# Patient Record
Sex: Female | Born: 1945 | Race: White | Hispanic: No | Smoking: Never smoker
Health system: Southern US, Community
[De-identification: ages and names within clinical notes are randomized; demographics above are authoritative.]

## PROBLEM LIST (undated history)

## (undated) DIAGNOSIS — M1990 Unspecified osteoarthritis, unspecified site: Secondary | ICD-10-CM

## (undated) DIAGNOSIS — E785 Hyperlipidemia, unspecified: Secondary | ICD-10-CM

## (undated) DIAGNOSIS — I10 Essential (primary) hypertension: Secondary | ICD-10-CM

## (undated) HISTORY — DX: Unspecified osteoarthritis, unspecified site: M19.90

## (undated) HISTORY — PX: DILATION AND CURETTAGE OF UTERUS: SHX78

## (undated) HISTORY — DX: Hyperlipidemia, unspecified: E78.5

## (undated) HISTORY — PX: TUBAL LIGATION: SHX77

## (undated) HISTORY — DX: Essential (primary) hypertension: I10

---

## 1998-08-13 DIAGNOSIS — E039 Hypothyroidism, unspecified: Secondary | ICD-10-CM | POA: Insufficient documentation

## 1998-08-13 DIAGNOSIS — E785 Hyperlipidemia, unspecified: Secondary | ICD-10-CM | POA: Insufficient documentation

## 1998-08-13 DIAGNOSIS — J301 Allergic rhinitis due to pollen: Secondary | ICD-10-CM | POA: Insufficient documentation

## 2008-01-26 ENCOUNTER — Other Ambulatory Visit: Payer: Self-pay

## 2008-01-26 ENCOUNTER — Ambulatory Visit: Payer: Self-pay | Admitting: Obstetrics & Gynecology

## 2008-01-29 DIAGNOSIS — R011 Cardiac murmur, unspecified: Secondary | ICD-10-CM | POA: Insufficient documentation

## 2008-02-05 ENCOUNTER — Ambulatory Visit: Payer: Self-pay | Admitting: Family Medicine

## 2008-02-25 DIAGNOSIS — I10 Essential (primary) hypertension: Secondary | ICD-10-CM | POA: Insufficient documentation

## 2008-04-13 ENCOUNTER — Ambulatory Visit: Payer: Self-pay | Admitting: Obstetrics & Gynecology

## 2008-09-29 DIAGNOSIS — M1990 Unspecified osteoarthritis, unspecified site: Secondary | ICD-10-CM | POA: Insufficient documentation

## 2010-10-05 ENCOUNTER — Emergency Department: Payer: Self-pay | Admitting: Emergency Medicine

## 2012-01-11 ENCOUNTER — Ambulatory Visit: Payer: Self-pay | Admitting: Family Medicine

## 2015-01-21 ENCOUNTER — Other Ambulatory Visit: Payer: Self-pay | Admitting: Family Medicine

## 2015-01-21 ENCOUNTER — Telehealth: Payer: Self-pay | Admitting: Family Medicine

## 2015-01-21 NOTE — Telephone Encounter (Signed)
I reached out to patient about her concern regarding her medication.  She stated she normally gets 6 mths supply of her medications when she see Dr. Sherrie Mustache.  She saw him on 11/03/14 and some of her medications were refilled and others were not.  Upon investigation, some were recorded and others were e-scribed to [employer].  Pt states there are not refills on her Lovastatin 20 MG; when I looked, Dr. Sherrie Mustache did send in a 90 day supply and 11 refills; however the pharmacy didn't have that on record per pt.  The other medication was the Hydrochlorothiazide which only had a 90 day supply.  Pt is requesting a refill on that as well.

## 2015-01-24 ENCOUNTER — Other Ambulatory Visit: Payer: Self-pay | Admitting: Family Medicine

## 2015-01-24 MED ORDER — LOVASTATIN 20 MG PO TABS: 20.0000 mg | ORAL_TABLET | Freq: Every day | ORAL | Status: DC

## 2015-01-24 MED ORDER — BISOPROLOL-HYDROCHLOROTHIAZIDE 5-6.25 MG PO TABS: 1.0000 | ORAL_TABLET | Freq: Every day | ORAL | Status: DC

## 2015-01-24 NOTE — Telephone Encounter (Signed)
Please advise refills? Last refill bis/hydro=11/04/2014 and Lovas=11/03/2014.

## 2015-01-24 NOTE — Telephone Encounter (Signed)
Have sent 90 days with 3 refills of both medications to [facility]. Thanks.

## 2015-06-07 DIAGNOSIS — M170 Bilateral primary osteoarthritis of knee: Secondary | ICD-10-CM | POA: Diagnosis not present

## 2015-06-15 DIAGNOSIS — M170 Bilateral primary osteoarthritis of knee: Secondary | ICD-10-CM | POA: Diagnosis not present

## 2015-06-22 DIAGNOSIS — M170 Bilateral primary osteoarthritis of knee: Secondary | ICD-10-CM | POA: Diagnosis not present

## 2015-11-18 ENCOUNTER — Other Ambulatory Visit: Payer: Self-pay | Admitting: Family Medicine

## 2016-01-20 ENCOUNTER — Other Ambulatory Visit: Payer: Self-pay | Admitting: Family Medicine

## 2016-01-21 NOTE — Telephone Encounter (Signed)
Last OV 10/2014  Thanks,   -Juwaun Inskeep  

## 2016-02-28 ENCOUNTER — Other Ambulatory Visit: Payer: Self-pay | Admitting: Family Medicine

## 2016-02-28 DIAGNOSIS — E039 Hypothyroidism, unspecified: Secondary | ICD-10-CM

## 2016-02-28 DIAGNOSIS — E785 Hyperlipidemia, unspecified: Secondary | ICD-10-CM

## 2016-03-12 ENCOUNTER — Encounter: Payer: Self-pay | Admitting: Physician Assistant

## 2016-03-12 ENCOUNTER — Ambulatory Visit (INDEPENDENT_AMBULATORY_CARE_PROVIDER_SITE_OTHER): Payer: Medicare Other | Admitting: Physician Assistant

## 2016-03-12 VITALS — BP 136/80 | HR 68 | Temp 97.6°F | Resp 16 | Ht 69.5 in | Wt 329.0 lb

## 2016-03-12 DIAGNOSIS — E039 Hypothyroidism, unspecified: Secondary | ICD-10-CM

## 2016-03-12 DIAGNOSIS — Z1231 Encounter for screening mammogram for malignant neoplasm of breast: Secondary | ICD-10-CM

## 2016-03-12 DIAGNOSIS — R7309 Other abnormal glucose: Secondary | ICD-10-CM

## 2016-03-12 DIAGNOSIS — Z124 Encounter for screening for malignant neoplasm of cervix: Secondary | ICD-10-CM

## 2016-03-12 DIAGNOSIS — Z0000 Encounter for general adult medical examination without abnormal findings: Secondary | ICD-10-CM

## 2016-03-12 DIAGNOSIS — I10 Essential (primary) hypertension: Secondary | ICD-10-CM

## 2016-03-12 DIAGNOSIS — E785 Hyperlipidemia, unspecified: Secondary | ICD-10-CM | POA: Diagnosis not present

## 2016-03-12 DIAGNOSIS — Z131 Encounter for screening for diabetes mellitus: Secondary | ICD-10-CM

## 2016-03-12 MED ORDER — BISOPROLOL-HYDROCHLOROTHIAZIDE 5-6.25 MG PO TABS: 1.0000 | ORAL_TABLET | Freq: Every day | ORAL | 3 refills | Status: DC

## 2016-03-12 NOTE — Progress Notes (Signed)
Patient: Tara Potter, Female    DOB: October 18, 1945, 70 y.o.   MRN: [id number] Visit Date: 03/13/2016  Today's Provider: Margaretann Loveless, PA-C   Chief Complaint  Patient presents with  . Medicare Wellness   Subjective:    Annual wellness visit Tara Potter is a 70 y.o. female. She feels well. She reports exercising some, but nothing formal secondary to knee and back pain. She reports she is sleeping fairly well.  Pt states sometimes her arthritis pain keeps her up. She is followed by orthopedics for her arthritis. She is seen at [facility].   TDAP: 12/29/2004; Patient reports she had Td booster in 2010-2011 at [facility] when she had a finger injury. Pneumococcal 23: 05/19/2103 Prevnar: Patient reports she got this at [facility] [address] Zostavax: Refused Pap: 02/05/2006; Never had an abnormal Colonoscopy/Cologuard: Refused -----------------------------------------------------------   Review of Systems  Constitutional: Negative.   HENT: Negative.   Eyes: Negative.   Respiratory: Negative.   Cardiovascular: Negative.   Gastrointestinal: Negative.   Genitourinary: Negative.   Musculoskeletal: Positive for arthralgias and back pain. Negative for gait problem, joint swelling, myalgias, neck pain and neck stiffness.  Skin: Negative.   Allergic/Immunologic: Negative.   Neurological: Negative.   Hematological: Negative.   Psychiatric/Behavioral: Negative.     Social History   Social History  . Marital status: Married    Spouse name: N/A  . Number of children: N/A  . Years of education: N/A   Occupational History  . Not on file.   Social History Main Topics  . Smoking status: Never Smoker  . Smokeless tobacco: Never Used  . Alcohol use No  . Drug use: No  . Sexual activity: Not on file   Other Topics Concern  . Not on file   Social History Narrative  . No narrative on file    History reviewed. No pertinent past medical history.    Patient Active Problem List   Diagnosis Date Noted  . Arthritis, degenerative 09/29/2008  . Essential (primary) hypertension 02/25/2008  . Cardiac murmur 01/29/2008  . HLD (hyperlipidemia) 08/13/1998  . Adult hypothyroidism 08/13/1998  . Hay fever 08/13/1998    Past Surgical History:  Procedure Laterality Date  . DILATION AND CURETTAGE OF UTERUS    . TUBAL LIGATION      Her family history includes Arthritis in her sister; Atrial fibrillation in her mother; Heart disease in her father; Hypertension in her mother; Kidney disease in her mother; Macular degeneration in her mother.    No outpatient prescriptions have been marked as taking for the 03/12/16 encounter (Office Visit) with Margaretann Loveless, PA-C.    Patient Care Team: Margaretann Loveless, PA-C as PCP - General (Family Medicine)    Objective:   Vitals: BP 136/80 (BP Location: Right Wrist, Patient Position: Sitting, Cuff Size: Normal)   Pulse 68   Temp 97.6 F (36.4 C) (Oral)   Resp 16   Ht 5' 9.5" (1.765 m)   Wt (!) 329 lb (149.2 kg)   BMI 47.89 kg/m   Physical Exam  Constitutional: She is oriented to person, place, and time. She appears well-developed and well-nourished. No distress.  HENT:  Head: Normocephalic and atraumatic.  Right Ear: External ear normal.  Left Ear: External ear normal.  Nose: Nose normal.  Mouth/Throat: Oropharynx is clear and moist. No oropharyngeal exudate.  Eyes: Conjunctivae and EOM are normal. Pupils are equal, round, and reactive to light. Right eye exhibits no  discharge. Left eye exhibits no discharge. No scleral icterus.  Neck: Normal range of motion. Neck supple. No JVD present. No tracheal deviation present. No thyromegaly present.  Cardiovascular: Normal rate, regular rhythm, normal heart sounds and intact distal pulses.  Exam reveals no gallop and no friction rub.   No murmur heard. Pulmonary/Chest: Effort normal and breath sounds normal. No respiratory distress. She has  no wheezes. She has no rales. She exhibits no tenderness. Right breast exhibits no inverted nipple, no mass, no nipple discharge, no skin change and no tenderness. Left breast exhibits no inverted nipple, no mass, no nipple discharge, no skin change and no tenderness. Breasts are symmetrical.  Abdominal: Soft. Bowel sounds are normal. She exhibits no distension and no mass. There is no tenderness. There is no rebound and no guarding.  Musculoskeletal: Normal range of motion. She exhibits no edema or tenderness.  Lymphadenopathy:    She has no cervical adenopathy.  Neurological: She is alert and oriented to person, place, and time.  Skin: Skin is warm and dry. No rash noted. She is not diaphoretic.  Psychiatric: She has a normal mood and affect. Her behavior is normal. Judgment and thought content normal.  Vitals reviewed.   Activities of Daily Living In your present state of health, do you have any difficulty performing the following activities: 03/12/2016  Hearing? Y  Vision? N  Difficulty concentrating or making decisions? N  Walking or climbing stairs? Y  Dressing or bathing? N  Doing errands, shopping? N  Some recent data might be hidden    Fall Risk Assessment Fall Risk  03/12/2016  Falls in the past year? No     Depression Screen PHQ 2/9 Scores 03/12/2016  PHQ - 2 Score 1    Cognitive Testing - 6-CIT  Correct? Score   What year is it? yes 0 0 or 4  What month is it? yes 0 0 or 3  Memorize:    Tara Potter,  41,  [address],  [place],      What time is it? (within 1 hour) yes 0 0 or 3  Count backwards from 20 yes 0 0, 2, or 4  Name the months of the year yes 0 0, 2, or 4  Repeat name & address above no 3 0, 2, 4, 6, 8, or 10       TOTAL SCORE  3/28   Interpretation:  Normal  Normal (0-7) Abnormal (8-28)       Assessment & Plan:     Annual Wellness Visit  Reviewed patient's Family Medical History Reviewed and updated list of patient's medical  providers Assessment of cognitive impairment was done Assessed patient's functional ability Established a written schedule for health screening services Health Risk Assessent Completed and Reviewed  Exercise Activities and Dietary recommendations Goals    None      Immunization History  Administered Date(s) Administered  . Influenza, High Dose Seasonal PF 06/22/2015  . Pneumococcal Polysaccharide-23 05/18/2013  . Td 12/29/2004    Health Maintenance  Topic Date Due  . Hepatitis C Screening  12-29-1945  . MAMMOGRAM  01/12/1995  . COLONOSCOPY  01/12/1995  . ZOSTAVAX  01/11/2005  . DEXA SCAN  01/11/2010  . PNA vac Low Risk Adult (2 of 2 - PCV13) 05/18/2014  . TETANUS/TDAP  12/30/2014  . INFLUENZA VACCINE  03/13/2016      Discussed health benefits of physical activity, and encouraged her to engage in regular exercise appropriate for her age and  condition.  1. Medicare annual wellness visit, subsequent Obese, but normal physical exam otherwise.  2. Encounter for screening mammogram for breast cancer Breast exam today was normal. There is no family history of breast cancer. She does perform regular self breast exams. Mammogram was ordered as below. Information for [facility] clinic was given to patient so she may schedule her mammogram at her convenience. - MM Digital Screening; Future  3. HLD (hyperlipidemia) Stable. Continue current medical treatment plan with lovastatin . Will check labs as below and f/u pending labs. - Lipid Profile  4. Hypothyroidism, unspecified hypothyroidism type Stable. Continue current medical treatment plan with levothyroxine . Will check labs as below and f/u pending results. - TSH  5. Essential (primary) hypertension Stable. Diagnosis pulled for medication refill. Continue current medical treatment plan. Will check labs as below and f/u pending results. - CBC with Differential - Comprehensive Metabolic Panel (CMET) -  bisoprolol-hydrochlorothiazide (ZIAC) 5-6.25 MG tablet; Take 1 tablet by mouth daily.  Dispense: 90 tablet; Refill: 3  6. Encounter for screening for diabetes mellitus Will check labs as below and f/u pending results. - HgB A1c  ------------------------------------------------------------------------------------------------------------    Margaretann Loveless, PA-C  [facility]

## 2016-03-12 NOTE — Patient Instructions (Signed)
Health Maintenance, Female Adopting a healthy lifestyle and getting preventive care can go a long way to promote health and wellness. Talk with your health care provider about what schedule of regular examinations is right for you. This is a good chance for you to check in with your provider about disease prevention and staying healthy. In between checkups, there are plenty of things you can do on your own. Experts have done a lot of research about which lifestyle changes and preventive measures are most likely to keep you healthy. Ask your health care provider for more information. WEIGHT AND DIET  Eat a healthy diet  Be sure to include plenty of vegetables, fruits, low-fat dairy products, and lean protein.  Do not eat a lot of foods high in solid fats, added sugars, or salt.  Get regular exercise. This is one of the most important things you can do for your health.  Most adults should exercise for at least 150 minutes each week. The exercise should increase your heart rate and make you sweat (moderate-intensity exercise).  Most adults should also do strengthening exercises at least twice a week. This is in addition to the moderate-intensity exercise.  Maintain a healthy weight  Body mass index (BMI) is a measurement that can be used to identify possible weight problems. It estimates body fat based on height and weight. Your health care provider can help determine your BMI and help you achieve or maintain a healthy weight.  For females 20 years of age and older:   A BMI below 18.5 is considered underweight.  A BMI of 18.5 to 24.9 is normal.  A BMI of 25 to 29.9 is considered overweight.  A BMI of 30 and above is considered obese.  Watch levels of cholesterol and blood lipids  You should start having your blood tested for lipids and cholesterol at 71 years of age, then have this test every 5 years.  You may need to have your cholesterol levels checked more often if:  Your lipid  or cholesterol levels are high.  You are older than 71 years of age.  You are at high risk for heart disease.  CANCER SCREENING   Lung Cancer  Lung cancer screening is recommended for adults 55-80 years old who are at high risk for lung cancer because of a history of smoking.  A yearly low-dose CT scan of the lungs is recommended for people who:  Currently smoke.  Have quit within the past 15 years.  Have at least a 30-pack-year history of smoking. A pack year is smoking an average of one pack of cigarettes a day for 1 year.  Yearly screening should continue until it has been 15 years since you quit.  Yearly screening should stop if you develop a health problem that would prevent you from having lung cancer treatment.  Breast Cancer  Practice breast self-awareness. This means understanding how your breasts normally appear and feel.  It also means doing regular breast self-exams. Let your health care provider know about any changes, no matter how small.  If you are in your 20s or 30s, you should have a clinical breast exam (CBE) by a health care provider every 1-3 years as part of a regular health exam.  If you are 40 or older, have a CBE every year. Also consider having a breast X-ray (mammogram) every year.  If you have a family history of breast cancer, talk to your health care provider about genetic screening.  If you   are at high risk for breast cancer, talk to your health care provider about having an MRI and a mammogram every year.  Breast cancer gene (BRCA) assessment is recommended for women who have family members with BRCA-related cancers. BRCA-related cancers include:  Breast.  Ovarian.  Tubal.  Peritoneal cancers.  Results of the assessment will determine the need for genetic counseling and BRCA1 and BRCA2 testing. Cervical Cancer Your health care provider may recommend that you be screened regularly for cancer of the pelvic organs (ovaries, uterus, and  vagina). This screening involves a pelvic examination, including checking for microscopic changes to the surface of your cervix (Pap test). You may be encouraged to have this screening done every 3 years, beginning at age 21.  For women ages 30-65, health care providers may recommend pelvic exams and Pap testing every 3 years, or they may recommend the Pap and pelvic exam, combined with testing for human papilloma virus (HPV), every 5 years. Some types of HPV increase your risk of cervical cancer. Testing for HPV may also be done on women of any age with unclear Pap test results.  Other health care providers may not recommend any screening for nonpregnant women who are considered low risk for pelvic cancer and who do not have symptoms. Ask your health care provider if a screening pelvic exam is right for you.  If you have had past treatment for cervical cancer or a condition that could lead to cancer, you need Pap tests and screening for cancer for at least 20 years after your treatment. If Pap tests have been discontinued, your risk factors (such as having a new sexual partner) need to be reassessed to determine if screening should resume. Some women have medical problems that increase the chance of getting cervical cancer. In these cases, your health care provider may recommend more frequent screening and Pap tests. Colorectal Cancer  This type of cancer can be detected and often prevented.  Routine colorectal cancer screening usually begins at 71 years of age and continues through 71 years of age.  Your health care provider may recommend screening at an earlier age if you have risk factors for colon cancer.  Your health care provider may also recommend using home test kits to check for hidden blood in the stool.  A small camera at the end of a tube can be used to examine your colon directly (sigmoidoscopy or colonoscopy). This is done to check for the earliest forms of colorectal  cancer.  Routine screening usually begins at age 50.  Direct examination of the colon should be repeated every 5-10 years through 71 years of age. However, you may need to be screened more often if early forms of precancerous polyps or small growths are found. Skin Cancer  Check your skin from head to toe regularly.  Tell your health care provider about any new moles or changes in moles, especially if there is a change in a mole's shape or color.  Also tell your health care provider if you have a mole that is larger than the size of a pencil eraser.  Always use sunscreen. Apply sunscreen liberally and repeatedly throughout the day.  Protect yourself by wearing long sleeves, pants, a wide-brimmed hat, and sunglasses whenever you are outside. HEART DISEASE, DIABETES, AND HIGH BLOOD PRESSURE   High blood pressure causes heart disease and increases the risk of stroke. High blood pressure is more likely to develop in:  People who have blood pressure in the high end   of the normal range (130-139/85-89 mm Hg).  People who are overweight or obese.  People who are African American.  If you are 38-23 years of age, have your blood pressure checked every 3-5 years. If you are 61 years of age or older, have your blood pressure checked every year. You should have your blood pressure measured twice--once when you are at a hospital or clinic, and once when you are not at a hospital or clinic. Record the average of the two measurements. To check your blood pressure when you are not at a hospital or clinic, you can use:  An automated blood pressure machine at a pharmacy.  A home blood pressure monitor.  If you are between 45 years and 39 years old, ask your health care provider if you should take aspirin to prevent strokes.  Have regular diabetes screenings. This involves taking a blood sample to check your fasting blood sugar level.  If you are at a normal weight and have a low risk for diabetes,  have this test once every three years after 71 years of age.  If you are overweight and have a high risk for diabetes, consider being tested at a younger age or more often. PREVENTING INFECTION  Hepatitis B  If you have a higher risk for hepatitis B, you should be screened for this virus. You are considered at high risk for hepatitis B if:  You were born in a country where hepatitis B is common. Ask your health care provider which countries are considered high risk.  Your parents were born in a high-risk country, and you have not been immunized against hepatitis B (hepatitis B vaccine).  You have HIV or AIDS.  You use needles to inject street drugs.  You live with someone who has hepatitis B.  You have had sex with someone who has hepatitis B.  You get hemodialysis treatment.  You take certain medicines for conditions, including cancer, organ transplantation, and autoimmune conditions. Hepatitis C  Blood testing is recommended for:  Everyone born from [birth year] through 1966.  Anyone with known risk factors for hepatitis C. Sexually transmitted infections (STIs)  You should be screened for sexually transmitted infections (STIs) including gonorrhea and chlamydia if:  You are sexually active and are younger than 71 years of age.  You are older than 71 years of age and your health care provider tells you that you are at risk for this type of infection.  Your sexual activity has changed since you were last screened and you are at an increased risk for chlamydia or gonorrhea. Ask your health care provider if you are at risk.  If you do not have HIV, but are at risk, it may be recommended that you take a prescription medicine daily to prevent HIV infection. This is called pre-exposure prophylaxis (PrEP). You are considered at risk if:  You are sexually active and do not regularly use condoms or know the HIV status of your partner(s).  You take drugs by injection.  You are sexually  active with a partner who has HIV. Talk with your health care provider about whether you are at high risk of being infected with HIV. If you choose to begin PrEP, you should first be tested for HIV. You should then be tested every 3 months for as long as you are taking PrEP.  PREGNANCY   If you are premenopausal and you may become pregnant, ask your health care provider about preconception counseling.  If you may  become pregnant, take 400 to 800 micrograms (mcg) of folic acid every day.  If you want to prevent pregnancy, talk to your health care provider about birth control (contraception). OSTEOPOROSIS AND MENOPAUSE   Osteoporosis is a disease in which the bones lose minerals and strength with aging. This can result in serious bone fractures. Your risk for osteoporosis can be identified using a bone density scan.  If you are 61 years of age or older, or if you are at risk for osteoporosis and fractures, ask your health care provider if you should be screened.  Ask your health care provider whether you should take a calcium or vitamin D supplement to lower your risk for osteoporosis.  Menopause may have certain physical symptoms and risks.  Hormone replacement therapy may reduce some of these symptoms and risks. Talk to your health care provider about whether hormone replacement therapy is right for you.  HOME CARE INSTRUCTIONS   Schedule regular health, dental, and eye exams.  Stay current with your immunizations.   Do not use any tobacco products including cigarettes, chewing tobacco, or electronic cigarettes.  If you are pregnant, do not drink alcohol.  If you are breastfeeding, limit how much and how often you drink alcohol.  Limit alcohol intake to no more than 1 drink per day for nonpregnant women. One drink equals 12 ounces of beer, 5 ounces of wine, or 1 ounces of hard liquor.  Do not use street drugs.  Do not share needles.  Ask your health care provider for help if  you need support or information about quitting drugs.  Tell your health care provider if you often feel depressed.  Tell your health care provider if you have ever been abused or do not feel safe at home.   This information is not intended to replace advice given to you by your health care provider. Make sure you discuss any questions you have with your health care provider.   Document Released: 02/12/2011 Document Revised: 08/20/2014 Document Reviewed: 07/01/2013 Elsevier Interactive Patient Education [employer].

## 2016-03-14 ENCOUNTER — Telehealth: Payer: Self-pay

## 2016-03-14 DIAGNOSIS — R7303 Prediabetes: Secondary | ICD-10-CM | POA: Insufficient documentation

## 2016-03-14 LAB — COMPREHENSIVE METABOLIC PANEL
A/G RATIO: 1.5 (ref 1.2–2.2)
ALBUMIN: 4 g/dL (ref 3.5–4.8)
ALT: 17 IU/L (ref 0–32)
AST: 17 IU/L (ref 0–40)
Alkaline Phosphatase: 108 IU/L (ref 39–117)
BILIRUBIN TOTAL: 0.3 mg/dL (ref 0.0–1.2)
BUN / CREAT RATIO: 24 (ref 12–28)
BUN: 18 mg/dL (ref 8–27)
CALCIUM: 10 mg/dL (ref 8.7–10.3)
CO2: 27 mmol/L (ref 18–29)
Chloride: 100 mmol/L (ref 96–106)
Creatinine, Ser: 0.76 mg/dL (ref 0.57–1.00)
GFR, EST AFRICAN AMERICAN: 91 mL/min/{1.73_m2} (ref >59)
GFR, EST NON AFRICAN AMERICAN: 79 mL/min/{1.73_m2} (ref >59)
Globulin, Total: 2.7 g/dL (ref 1.5–4.5)
Glucose: 98 mg/dL (ref 65–99)
POTASSIUM: 4.5 mmol/L (ref 3.5–5.2)
Sodium: 141 mmol/L (ref 134–144)
TOTAL PROTEIN: 6.7 g/dL (ref 6.0–8.5)

## 2016-03-14 LAB — LIPID PANEL
CHOL/HDL RATIO: 4.4 ratio (ref 0.0–4.4)
Cholesterol, Total: 204 mg/dL — ABNORMAL HIGH (ref 100–199)
HDL: 46 mg/dL (ref >39)
LDL Calculated: 126 mg/dL — ABNORMAL HIGH (ref 0–99)
Triglycerides: 160 mg/dL — ABNORMAL HIGH (ref 0–149)
VLDL CHOLESTEROL CAL: 32 mg/dL (ref 5–40)

## 2016-03-14 LAB — CBC WITH DIFFERENTIAL/PLATELET
Basophils Absolute: 0.1 10*3/uL (ref 0.0–0.2)
Basos: 1 %
EOS (ABSOLUTE): 0.3 10*3/uL (ref 0.0–0.4)
Eos: 3 %
HEMOGLOBIN: 14.4 g/dL (ref 11.1–15.9)
Hematocrit: 44.2 % (ref 34.0–46.6)
IMMATURE GRANULOCYTES: 0 %
Immature Grans (Abs): 0 10*3/uL (ref 0.0–0.1)
LYMPHS: 37 %
Lymphocytes Absolute: 3.5 10*3/uL — ABNORMAL HIGH (ref 0.7–3.1)
MCH: 29 pg (ref 26.6–33.0)
MCHC: 32.6 g/dL (ref 31.5–35.7)
MCV: 89 fL (ref 79–97)
MONOCYTES: 7 %
Monocytes Absolute: 0.7 10*3/uL (ref 0.1–0.9)
NEUTROS PCT: 52 %
Neutrophils Absolute: 4.8 10*3/uL (ref 1.4–7.0)
Platelets: 277 10*3/uL (ref 150–379)
RBC: 4.96 x10E6/uL (ref 3.77–5.28)
RDW: 13.5 % (ref 12.3–15.4)
WBC: 9.3 10*3/uL (ref 3.4–10.8)

## 2016-03-14 LAB — HEMOGLOBIN A1C
Est. average glucose Bld gHb Est-mCnc: 123 mg/dL
HEMOGLOBIN A1C: 5.9 % — AB (ref 4.8–5.6)

## 2016-03-14 LAB — TSH: TSH: 4.98 u[IU]/mL — AB (ref 0.450–4.500)

## 2016-03-14 NOTE — Telephone Encounter (Signed)
Patient advised as directed below.  Thanks,  -Jayle Solarz 

## 2016-03-14 NOTE — Telephone Encounter (Signed)
-----   Message from Jennifer M Burnette, PA-C sent at 03/14/2016 10:20 AM EDT ----- All labs are within normal limits and stable with exception of cholesterol and HgBA1c. Cholesterol is borderline elevated. Triglycerides (most related to diet) are what is highest. Make sure to limit fatty foods and foods high in cholesterol from diet.  Sugars and carbohydrates also play a role in cholesterol as well as HgBA1c. HgBA1c was elevated at 5.9, indicating pre-diabetes. Make lifestyle adjustments as described and try to continue to add exercise (water therapy can be good for arthritis and back pain).  Thanks! -JB  

## 2016-03-14 NOTE — Telephone Encounter (Signed)
Patient has been advised. KW 

## 2016-03-14 NOTE — Telephone Encounter (Signed)
-----   Message from Margaretann Loveless, [place] sent at 03/14/2016 10:20 AM EDT ----- All labs are within normal limits and stable with exception of cholesterol and HgBA1c. Cholesterol is borderline elevated. Triglycerides (most related to diet) are what is highest. Make sure to limit fatty foods and foods high in cholesterol from diet.  Sugars and carbohydrates also play a role in cholesterol as well as HgBA1c. HgBA1c was elevated at 5.9, indicating pre-diabetes. Make lifestyle adjustments as described and try to continue to add exercise (water therapy can be good for arthritis and back pain).  Thanks! -JB

## 2016-03-26 ENCOUNTER — Other Ambulatory Visit: Payer: Self-pay | Admitting: Physician Assistant

## 2016-03-26 DIAGNOSIS — E039 Hypothyroidism, unspecified: Secondary | ICD-10-CM

## 2016-03-27 ENCOUNTER — Other Ambulatory Visit: Payer: Self-pay | Admitting: Physician Assistant

## 2016-03-27 DIAGNOSIS — E785 Hyperlipidemia, unspecified: Secondary | ICD-10-CM

## 2016-04-05 ENCOUNTER — Other Ambulatory Visit: Payer: Self-pay | Admitting: Family Medicine

## 2016-04-05 NOTE — Telephone Encounter (Signed)
Last refill was 11/01/2014. Allene DillonEmily Drozdowski, CMA

## 2016-04-19 ENCOUNTER — Telehealth: Payer: Self-pay | Admitting: Physician Assistant

## 2016-04-19 DIAGNOSIS — E039 Hypothyroidism, unspecified: Secondary | ICD-10-CM

## 2016-04-19 DIAGNOSIS — E785 Hyperlipidemia, unspecified: Secondary | ICD-10-CM

## 2016-04-19 NOTE — Telephone Encounter (Signed)
Pt called saying the last prescriptions were sent in for 30 days.  She request that all her prescriptions be sent in for 90 days from now on.  She said the pharmacy may send in a request also.    She uses [facility] [address].  Her call backis [phone].  Thanks Fortune Brandsteri

## 2016-04-19 NOTE — Telephone Encounter (Signed)
Ok to send meds in for 90 days? Please advise. Thanks!

## 2016-04-19 NOTE — Telephone Encounter (Signed)
Yes 90 days is fine

## 2016-04-20 MED ORDER — LOVASTATIN 20 MG PO TABS: 20.0000 mg | ORAL_TABLET | Freq: Every day | ORAL | 0 refills | Status: DC

## 2016-04-20 MED ORDER — LEVOTHYROXINE SODIUM 75 MCG PO TABS: 75.0000 ug | ORAL_TABLET | Freq: Every day | ORAL | 0 refills | Status: DC

## 2016-04-20 NOTE — Telephone Encounter (Signed)
Sent in medications for 90 days as below.

## 2016-06-13 ENCOUNTER — Other Ambulatory Visit: Payer: Self-pay | Admitting: Physician Assistant

## 2016-07-23 ENCOUNTER — Other Ambulatory Visit: Payer: Self-pay | Admitting: Physician Assistant

## 2016-07-23 DIAGNOSIS — E785 Hyperlipidemia, unspecified: Secondary | ICD-10-CM

## 2016-07-23 NOTE — Telephone Encounter (Signed)
LOV 03/12/2016. Last refill 04/20/2016.

## 2016-07-24 ENCOUNTER — Other Ambulatory Visit: Payer: Self-pay | Admitting: Physician Assistant

## 2016-07-24 MED ORDER — LEVOTHYROXINE SODIUM 75 MCG PO TABS: 75.0000 ug | ORAL_TABLET | Freq: Every day | ORAL | 1 refills | Status: DC

## 2016-07-24 NOTE — Telephone Encounter (Signed)
I have not seen a fax but will refill sent to [facility] [address]

## 2016-07-24 NOTE — Telephone Encounter (Signed)
Have you seen this fax? Allene DillonEmily Drozdowski, CMA

## 2016-07-24 NOTE — Telephone Encounter (Signed)
[facility] called they need an approval to switch the   levothyroxine (SYNTHROID, LEVOTHROID) 75 MCG tablet   04/20/16 -- Margaretann LovelessJennifer M Burnette, PA-C    Take 1 tablet (75 mcg total) by mouth daily.    They said they faxed a rrquest yesterday and have not heard back.  Please advise,  ThanksTeri

## 2016-07-26 NOTE — Telephone Encounter (Signed)
Fax received. Antony ContrasJenni signed the fax stating it is okay to change manufacturers, [facility] Pt advised. Allene DillonEmily Drozdowski, CMA
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
## 2016-07-26 NOTE — Telephone Encounter (Signed)
Pt stated that [facility] advised her they still have heard from us for approval to change the brand of levothyroxine (SYNTHROID, LEVOTHROID) 75 MCG tablet and that they have tried calling and faxing us multiple times. I advised that we hadn't heard from pharmacy. Can someone contact [facility] so pt can get her medication b/c she stated she is out of the medication. Please advise. Thanks TNP

## 2016-07-26 NOTE — Telephone Encounter (Signed)
Pt going to call pharmacy for clarification. Allene DillonEmily Drozdowski, CMA

## 2016-11-08 ENCOUNTER — Ambulatory Visit (INDEPENDENT_AMBULATORY_CARE_PROVIDER_SITE_OTHER): Payer: Medicare Other | Admitting: Family Medicine

## 2016-11-08 ENCOUNTER — Encounter: Payer: Self-pay | Admitting: Family Medicine

## 2016-11-08 VITALS — BP 126/80 | HR 73 | Temp 98.4°F | Resp 16 | Wt 325.6 lb

## 2016-11-08 DIAGNOSIS — K122 Cellulitis and abscess of mouth: Secondary | ICD-10-CM

## 2016-11-08 DIAGNOSIS — B9789 Other viral agents as the cause of diseases classified elsewhere: Secondary | ICD-10-CM | POA: Diagnosis not present

## 2016-11-08 DIAGNOSIS — J069 Acute upper respiratory infection, unspecified: Secondary | ICD-10-CM | POA: Diagnosis not present

## 2016-11-08 MED ORDER — CEFDINIR 300 MG PO CAPS: 300.0000 mg | ORAL_CAPSULE | Freq: Two times a day (BID) | ORAL | 0 refills | Status: DC

## 2016-11-08 NOTE — Patient Instructions (Signed)
Discussed use of Mucinex D for congestion. 

## 2016-11-08 NOTE — Progress Notes (Signed)
Subjective:     Patient ID: Tara Potter, female   DOB: 05/29/1946, 71 y.o.   MRN: [id number]  HPI  Chief Complaint  Patient presents with  . Sinus Problem    Patient comes in office today with complaints of sinus pain under eyes, nasal drainage, sneezing and coughing for a week. Patient states that last night she noticed the back of her throat was swollen and she reports seeing a yellow like blister on her uvula. Patient reports taking otc Mucinex and Vicks vapor rub.   Reports cold sx were getting better with residual sinus pressure. States uvula is swollen.   Review of Systems     Objective:   Physical Exam  Constitutional: She appears well-developed and well-nourished. No distress.  Ears: T.M's intact without inflammation Sinuses: mild maxillary sinus tenderness Throat: no tonsillar enlargement or exudate, uvular is edematous with area of erythyema Neck: no cervical adenopathy Lungs: clear     Assessment:    1. Uvulitis - cefdinir (OMNICEF) 300 MG capsule; Take 1 capsule (300 mg total) by mouth 2 (two) times daily.  Dispense: 20 capsule; Refill: 0  2. Viral upper respiratory tract infection    Plan:    Discussed use of Mucinex D for congestion.

## 2017-01-24 ENCOUNTER — Other Ambulatory Visit: Payer: Self-pay | Admitting: Physician Assistant

## 2017-01-24 DIAGNOSIS — E785 Hyperlipidemia, unspecified: Secondary | ICD-10-CM

## 2017-02-28 ENCOUNTER — Ambulatory Visit
Admission: RE | Admit: 2017-02-28 | Discharge: 2017-02-28 | Disposition: A | Discharge status: Home / Self Care | Payer: Medicare Other | Source: Ambulatory Visit | Attending: Family Medicine | Admitting: Family Medicine

## 2017-02-28 ENCOUNTER — Other Ambulatory Visit: Payer: Self-pay | Admitting: Family Medicine

## 2017-02-28 ENCOUNTER — Ambulatory Visit (INDEPENDENT_AMBULATORY_CARE_PROVIDER_SITE_OTHER): Payer: Medicare Other | Admitting: Family Medicine

## 2017-02-28 ENCOUNTER — Encounter: Payer: Self-pay | Admitting: Family Medicine

## 2017-02-28 VITALS — BP 104/84 | HR 70 | Temp 98.0°F | Resp 16 | Wt 325.0 lb

## 2017-02-28 DIAGNOSIS — S46911A Strain of unspecified muscle, fascia and tendon at shoulder and upper arm level, right arm, initial encounter: Secondary | ICD-10-CM | POA: Insufficient documentation

## 2017-02-28 DIAGNOSIS — M7531 Calcific tendinitis of right shoulder: Secondary | ICD-10-CM | POA: Diagnosis not present

## 2017-02-28 DIAGNOSIS — X58XXXA Exposure to other specified factors, initial encounter: Secondary | ICD-10-CM | POA: Diagnosis not present

## 2017-02-28 NOTE — Progress Notes (Addendum)
Subjective:     Patient ID: Tara Potter, female   DOB: 12/29/1945, 71 y.o.   MRN: [id number]  HPI  Chief Complaint  Patient presents with  . Shoulder Pain    Patient comes in office today with complaints of right shoulder pain for the past several days. Patient describes pain as shooting and achy. Patient reports that pain has been radiating down her arm and she has taken presciption Diclofenac 75, Aleve.   States she associates her shoulder pain when her 2035 # grandson visits every two weeks as he likes to be picked up a lot. States her pain is intermittent when she turns a particular way. 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   Review of Systems     Objective:   Physical Exam  Constitutional: She appears well-developed and well-nourished. No distress.  Musculoskeletal:  Right shoulder strength and grips 5/5. Patient localizes pain to her posterior shoulder. Increased pain with resisted  abduction and IR/ER. Can flex > 90 degrees without difficulty.       Assessment:    1. Shoulder strain, right, initial encounter - DG Shoulder Right; Future    Plan:    Discussed use of Aleve and heat with further f/u pending x-ray. Encourage to add an acid blocker like Pepcid for chronic use.

## 2017-02-28 NOTE — Patient Instructions (Addendum)
We will call you with the x-ray results. Take two Aleve with food twice daily. Would add Pepcid or similar daily to protect your stomach. Try heat for 20 minutes several x day.

## 2017-03-01 ENCOUNTER — Telehealth: Payer: Self-pay

## 2017-03-01 NOTE — Telephone Encounter (Signed)
Pt advised and agrees with treatment plan. 

## 2017-03-01 NOTE — Telephone Encounter (Signed)
-----   Message from Anola Gurneyobert Chauvin, [place] sent at 03/01/2017  7:37 AM EDT ----- Minimal bony abnormality. Try Naproxen (Aleve) for 1-2 weeks and minimize lifting. Call if not improving for orthopedic referral.

## 2017-04-02 ENCOUNTER — Encounter: Payer: Medicare Other | Admitting: Family Medicine

## 2017-04-11 ENCOUNTER — Ambulatory Visit (INDEPENDENT_AMBULATORY_CARE_PROVIDER_SITE_OTHER): Payer: Medicare Other | Admitting: Family Medicine

## 2017-04-11 ENCOUNTER — Ambulatory Visit (INDEPENDENT_AMBULATORY_CARE_PROVIDER_SITE_OTHER): Payer: Medicare Other

## 2017-04-11 VITALS — BP 122/72 | HR 76 | Temp 98.8°F | Ht 70.0 in | Wt 330.0 lb

## 2017-04-11 VITALS — BP 116/70 | HR 76 | Temp 98.8°F | Resp 16 | Ht 69.0 in | Wt 330.0 lb

## 2017-04-11 DIAGNOSIS — Z6841 Body Mass Index (BMI) 40.0 and over, adult: Secondary | ICD-10-CM | POA: Diagnosis not present

## 2017-04-11 DIAGNOSIS — E785 Hyperlipidemia, unspecified: Secondary | ICD-10-CM | POA: Diagnosis not present

## 2017-04-11 DIAGNOSIS — M7551 Bursitis of right shoulder: Secondary | ICD-10-CM | POA: Diagnosis not present

## 2017-04-11 DIAGNOSIS — M170 Bilateral primary osteoarthritis of knee: Secondary | ICD-10-CM

## 2017-04-11 DIAGNOSIS — Z1231 Encounter for screening mammogram for malignant neoplasm of breast: Secondary | ICD-10-CM | POA: Diagnosis not present

## 2017-04-11 DIAGNOSIS — I10 Essential (primary) hypertension: Secondary | ICD-10-CM

## 2017-04-11 DIAGNOSIS — E669 Obesity, unspecified: Secondary | ICD-10-CM

## 2017-04-11 DIAGNOSIS — R7309 Other abnormal glucose: Secondary | ICD-10-CM

## 2017-04-11 DIAGNOSIS — Z1159 Encounter for screening for other viral diseases: Secondary | ICD-10-CM

## 2017-04-11 DIAGNOSIS — IMO0001 Reserved for inherently not codable concepts without codable children: Secondary | ICD-10-CM

## 2017-04-11 DIAGNOSIS — Z23 Encounter for immunization: Secondary | ICD-10-CM

## 2017-04-11 DIAGNOSIS — E039 Hypothyroidism, unspecified: Secondary | ICD-10-CM | POA: Diagnosis not present

## 2017-04-11 DIAGNOSIS — Z0000 Encounter for general adult medical examination without abnormal findings: Secondary | ICD-10-CM | POA: Insufficient documentation

## 2017-04-11 MED ORDER — LOVASTATIN 20 MG PO TABS: 20.0000 mg | ORAL_TABLET | Freq: Every day | ORAL | 1 refills | Status: DC

## 2017-04-11 MED ORDER — BISOPROLOL-HYDROCHLOROTHIAZIDE 5-6.25 MG PO TABS: 1.0000 | ORAL_TABLET | Freq: Every day | ORAL | 3 refills | Status: DC

## 2017-04-11 NOTE — Progress Notes (Signed)
Aspiration/Injection Procedure Note Tara SisJudith C Potter [id number] 06/12/1946  Procedure: Injection Indications: b/l knee OA, pain  Procedure Details Consent: Risks of procedure as well as the alternatives and risks of each were explained to the (patient/caregiver).  Consent for procedure obtained. (verbally) Time Out: Verified patient identification, verified procedure, site/side was marked, verified correct patient position, special equipment/implants available, medications/allergies/relevent history reviewed, required imaging and test results available.  Performed   Local Anesthesia Used:Lidocaine 1% plain; 4mL and Ethyl Chloride Spray 1mL 40mg  depo-medrol injected into each knee using lateral approach after cleaning in typical fashion A sterile dressing was applied.  Patient did tolerate procedure well. Estimated blood loss: none  Shirlee Latchngela Bacigalupo 04/11/2017, 2:53 PM

## 2017-04-11 NOTE — Assessment & Plan Note (Signed)
Discussed diet and exercise 

## 2017-04-11 NOTE — Assessment & Plan Note (Signed)
Recheck A1c Discussed diet and exercise 

## 2017-04-11 NOTE — Patient Instructions (Signed)
[name] , Thank you for taking time to come for your Medicare Wellness Visit. I appreciate your ongoing commitment to your health goals. Please review the following plan we discussed and let me know if I can assist you in the future.   Screening recommendations/referrals: Colonoscopy: declined Mammogram: needs order Bone Density: declined Recommended yearly ophthalmology/optometry visit for glaucoma screening and checkup Recommended yearly dental visit for hygiene and checkup  Vaccinations: Influenza vaccine: declined Pneumococcal vaccine: completed series today Tdap vaccine: declined Shingles vaccine: declined  Advanced directives: Advance directive discussed with you today. I have provided a copy for you to complete at home and have notarized. Once this is complete please bring a copy in to our office so we can scan it into your chart.  Conditions/risks identified: Obesity; Recommend cutting out all extra sugars in daily diet, no more sweets.   Next appointment: None, need to schedule 1 year AWV.   Preventive Care 3165 Years and Older, Female Preventive care refers to lifestyle choices and visits with your health care provider that can promote health and wellness. What does preventive care include?  A yearly physical exam. This is also called an annual well check.  Dental exams once or twice a year.  Routine eye exams. Ask your health care provider how often you should have your eyes checked.  Personal lifestyle choices, including:  Daily care of your teeth and gums.  Regular physical activity.  Eating a healthy diet.  Avoiding tobacco and drug use.  Limiting alcohol use.  Practicing safe sex.  Taking low-dose aspirin every day.  Taking vitamin and mineral supplements as recommended by your health care provider. What happens during an annual well check? The services and screenings done by your health care provider during your annual well check will depend on your  age, overall health, lifestyle risk factors, and family history of disease. Counseling  Your health care provider may ask you questions about your:  Alcohol use.  Tobacco use.  Drug use.  Emotional well-being.  Home and relationship well-being.  Sexual activity.  Eating habits.  History of falls.  Memory and ability to understand (cognition).  Work and work Astronomerenvironment.  Reproductive health. Screening  You may have the following tests or measurements:  Height, weight, and BMI.  Blood pressure.  Lipid and cholesterol levels. These may be checked every 5 years, or more frequently if you are over 10478 years old.  Skin check.  Lung cancer screening. You may have this screening every year starting at age 72 if you have a 30-pack-year history of smoking and currently smoke or have quit within the past 15 years.  Fecal occult blood test (FOBT) of the stool. You may have this test every year starting at age 72.  Flexible sigmoidoscopy or colonoscopy. You may have a sigmoidoscopy every 5 years or a colonoscopy every 10 years starting at age 72.  Hepatitis C blood test.  Hepatitis B blood test.  Sexually transmitted disease (STD) testing.  Diabetes screening. This is done by checking your blood sugar (glucose) after you have not eaten for a while (fasting). You may have this done every 1-3 years.  Bone density scan. This is done to screen for osteoporosis. You may have this done starting at age 72.  Mammogram. This may be done every 1-2 years. Talk to your health care provider about how often you should have regular mammograms. Talk with your health care provider about your test results, treatment options, and if necessary, the  need for more tests. Vaccines  Your health care provider may recommend certain vaccines, such as:  Influenza vaccine. This is recommended every year.  Tetanus, diphtheria, and acellular pertussis (Tdap, Td) vaccine. You may need a Td booster  every 10 years.  Zoster vaccine. You may need this after age 2.  Pneumococcal 13-valent conjugate (PCV13) vaccine. One dose is recommended after age 49.  Pneumococcal polysaccharide (PPSV23) vaccine. One dose is recommended after age 64. Talk to your health care provider about which screenings and vaccines you need and how often you need them. This information is not intended to replace advice given to you by your health care provider. Make sure you discuss any questions you have with your health care provider. Document Released: 08/26/2015 Document Revised: 04/18/2016 Document Reviewed: 05/31/2015 Elsevier Interactive Patient Education  2017 [place] Prevention in the Home Falls can cause injuries. They can happen to people of all ages. There are many things you can do to make your home safe and to help prevent falls. What can I do on the outside of my home?  Regularly fix the edges of walkways and driveways and fix any cracks.  Remove anything that might make you trip as you walk through a door, such as a raised step or threshold.  Trim any bushes or trees on the path to your home.  Use bright outdoor lighting.  Clear any walking paths of anything that might make someone trip, such as rocks or tools.  Regularly check to see if handrails are loose or broken. Make sure that both sides of any steps have handrails.  Any raised decks and porches should have guardrails on the edges.  Have any leaves, snow, or ice cleared regularly.  Use sand or salt on walking paths during winter.  Clean up any spills in your garage right away. This includes oil or grease spills. What can I do in the bathroom?  Use night lights.  Install grab bars by the toilet and in the tub and shower. Do not use towel bars as grab bars.  Use non-skid mats or decals in the tub or shower.  If you need to sit down in the shower, use a plastic, non-slip stool.  Keep the floor dry. Clean up any  water that spills on the floor as soon as it happens.  Remove soap buildup in the tub or shower regularly.  Attach bath mats securely with double-sided non-slip rug tape.  Do not have throw rugs and other things on the floor that can make you trip. What can I do in the bedroom?  Use night lights.  Make sure that you have a light by your bed that is easy to reach.  Do not use any sheets or blankets that are too big for your bed. They should not hang down onto the floor.  Have a firm chair that has side arms. You can use this for support while you get dressed.  Do not have throw rugs and other things on the floor that can make you trip. What can I do in the kitchen?  Clean up any spills right away.  Avoid walking on wet floors.  Keep items that you use a lot in easy-to-reach places.  If you need to reach something above you, use a strong step stool that has a grab bar.  Keep electrical cords out of the way.  Do not use floor polish or wax that makes floors slippery. If you must use wax,  use non-skid floor wax.  Do not have throw rugs and other things on the floor that can make you trip. What can I do with my stairs?  Do not leave any items on the stairs.  Make sure that there are handrails on both sides of the stairs and use them. Fix handrails that are broken or loose. Make sure that handrails are as long as the stairways.  Check any carpeting to make sure that it is firmly attached to the stairs. Fix any carpet that is loose or worn.  Avoid having throw rugs at the top or bottom of the stairs. If you do have throw rugs, attach them to the floor with carpet tape.  Make sure that you have a light switch at the top of the stairs and the bottom of the stairs. If you do not have them, ask someone to add them for you. What else can I do to help prevent falls?  Wear shoes that:  Do not have high heels.  Have rubber bottoms.  Are comfortable and fit you well.  Are closed  at the toe. Do not wear sandals.  If you use a stepladder:  Make sure that it is fully opened. Do not climb a closed stepladder.  Make sure that both sides of the stepladder are locked into place.  Ask someone to hold it for you, if possible.  Clearly mark and make sure that you can see:  Any grab bars or handrails.  First and last steps.  Where the edge of each step is.  Use tools that help you move around (mobility aids) if they are needed. These include:  Canes.  Walkers.  Scooters.  Crutches.  Turn on the lights when you go into a dark area. Replace any light bulbs as soon as they burn out.  Set up your furniture so you have a clear path. Avoid moving your furniture around.  If any of your floors are uneven, fix them.  If there are any pets around you, be aware of where they are.  Review your medicines with your doctor. Some medicines can make you feel dizzy. This can increase your chance of falling. Ask your doctor what other things that you can do to help prevent falls. This information is not intended to replace advice given to you by your health care provider. Make sure you discuss any questions you have with your health care provider. Document Released: 05/26/2009 Document Revised: 01/05/2016 Document Reviewed: 09/03/2014 Elsevier Interactive Patient Education  2017 [employer].

## 2017-04-11 NOTE — Patient Instructions (Signed)

## 2017-04-11 NOTE — Assessment & Plan Note (Signed)
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

## 2017-04-11 NOTE — Assessment & Plan Note (Signed)
Symptoms c/w bursitis Could consider Steroid injection, but not today as she is getting 2 knee injections Continue NSAIDs - warned on potential consequences of long term therapy with NSAIDs F/u in 1 month

## 2017-04-11 NOTE — Assessment & Plan Note (Signed)
Well controlled Continue current medications Check CMP 

## 2017-04-11 NOTE — Assessment & Plan Note (Signed)
Stable Continue lovastatin Recheck lipid panel, CMP

## 2017-04-11 NOTE — Progress Notes (Signed)
Patient: Tara SisJudith C Wecker, Female    DOB: 03/13/1946, 71 y.o.   MRN: [id number] Visit Date: 04/11/2017  Today's Provider: Shirlee LatchAngela Jazaria Jarecki, MD   Chief Complaint  Patient presents with  . Annual Exam   Subjective:    Annual physical exam Tara Potter is a 71 y.o. female who presents today for health maintenance and complete physical. She just saw the nurse health advisor for her AWV. She feels fairly well. She is c/o arthritic pain in her knees. She reports she is not exercising. She reports she is sleeping fairly well. She does wake several times in the night.   She had a mammogram ordered at her last physical on 03/12/2016, but this was not performed. 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 - difficult to walk up stairs - lives in split level home - has seen chiropractor - taking diclofenac orally - seems to help some - wonders if something could help more with the pain - unable to walk any distance - using a scooter to get around [employer] - has tried celebrex and meloxicam in the past - helped less than the diclofenac - also using voltaren gel - tried naproxen in the past - helped about the same as diclofenac - tried steroid injections and HA injections in b/l knees in the past - didn't help enough  R shoulder - lifting 320 month old grandson often who weighs 35 lbs - pain started in posterior shoulder, now radiates to deltoid and anterior shoulder - feels betterin abduction - on going for 2 months - taking NSAIDs as above - helps some - hurts to sleep on at night  Hypothyroidism - taking Synthroid 75mcg daily - good compliance - last TSH 4.98 in 03/2016 - denies symptoms  HTN: - Medications: Ziac daily - Compliance: good - Checking BP at home: no - Denies any SOB, CP, vision changes, LE edema, medication SEs, or symptoms  of hypotension - Diet: low sodium  HLD - medications: lovastatin 20mg  daily - compliance: good - medication SEs: none   Review of Systems  Constitutional: Negative.   HENT: Negative.   Eyes: Negative.   Respiratory: Negative.   Cardiovascular: Negative.   Gastrointestinal: Negative.   Endocrine: Negative.   Genitourinary: Negative.   Musculoskeletal: Positive for arthralgias and back pain. Negative for gait problem, joint swelling, myalgias, neck pain and neck stiffness.  Skin: Negative.   Allergic/Immunologic: Negative.   Neurological: Negative.   Psychiatric/Behavioral: Negative.     Social History      She  reports that she has never smoked. She has never used smokeless tobacco. She reports that she drinks alcohol. She reports that she does not use drugs.       Social History   Social History  . Marital status: Married    Spouse name: N/A  . Number of children: N/A  . Years of education: N/A   Social History Main Topics  . Smoking status: Never Smoker  . Smokeless tobacco: Never Used  . Alcohol use Yes     Comment: rare- taste testing  . Drug use: No  . Sexual activity: Not on file   Other Topics Concern  . Not on file   Social History Narrative  . No narrative on file    Past Medical History:  Diagnosis Date  . Arthritis   . Hyperlipidemia   . Hypertension  Patient Active Problem List   Diagnosis Date Noted  . Bursitis of right shoulder 04/11/2017  . Elevated hemoglobin A1c 03/14/2016  . Arthritis, degenerative 09/29/2008  . Essential (primary) hypertension 02/25/2008  . Cardiac murmur 01/29/2008  . HLD (hyperlipidemia) 08/13/1998  . Adult hypothyroidism 08/13/1998  . Hay fever 08/13/1998    Past Surgical History:  Procedure Laterality Date  . DILATION AND CURETTAGE OF UTERUS    . TUBAL LIGATION      Family History        Family Status  Relation Status  . Mother Deceased at age 30  . Father Deceased at age 69-95  . Sister  Alive        Her family history includes Arthritis in her sister; Atrial fibrillation in her mother; Heart disease in her father; Hypertension in her mother; Kidney disease in her mother; Macular degeneration in her mother.     Allergies  Allergen Reactions  . Amoxicillin Diarrhea  . Entex T  [Pseudoephedrine-Guaifenesin]     keeps awake  . Erythromycin Diarrhea     Current Outpatient Prescriptions:  .  bisoprolol-hydrochlorothiazide (ZIAC) 5-6.25 MG tablet, Take 1 tablet by mouth daily., Disp: 90 tablet, Rfl: 3 .  diclofenac (VOLTAREN) 75 MG EC tablet, Take 75 mg by mouth daily. , Disp: , Rfl:  .  fluticasone (FLONASE) 50 MCG/ACT nasal spray, Place 2 sprays into the nose every other day as needed. , Disp: , Rfl:  .  levothyroxine (SYNTHROID, LEVOTHROID) 75 MCG tablet, Take 1 tablet (75 mcg total) by mouth daily., Disp: 90 tablet, Rfl: 1 .  lovastatin (MEVACOR) 20 MG tablet, TAKE ONE TABLET BY MOUTH AT BEDTIME, Disp: 90 tablet, Rfl: 1 .  Multiple Vitamins-Minerals (MULTIVITAL PO), Take by mouth daily., Disp: , Rfl:    Patient Care Team: Erasmo Downer, MD as PCP - General (Family Medicine) Dingeldein, Viviann Spare, MD as Consulting Physician (Ophthalmology)      Objective:   Vitals: BP 116/70 (BP Location: Right Arm, Patient Position: Sitting, Cuff Size: Large)   Pulse 76   Temp 98.8 F (37.1 C) (Oral)   Resp 16   Ht 5\' 9"  (1.753 m)   Wt (!) 330 lb (149.7 kg)   BMI 48.73 kg/m    Vitals:   04/11/17 1416  BP: 116/70  Pulse: 76  Resp: 16  Temp: 98.8 F (37.1 C)  TempSrc: Oral  Weight: (!) 330 lb (149.7 kg)  Height: 5\' 9"  (1.753 m)     Physical Exam  Constitutional: She is oriented to person, place, and time. She appears well-developed and well-nourished. No distress.  obese  HENT:  Head: Normocephalic and atraumatic.  Right Ear: External ear normal.  Left Ear: External ear normal.  Nose: Nose normal.  Mouth/Throat: Oropharynx is clear and moist.  Eyes:  Conjunctivae and EOM are normal. No scleral icterus.  Neck: Neck supple. No thyromegaly present.  Cardiovascular: Normal rate, regular rhythm, normal heart sounds and intact distal pulses.   No murmur heard. Pulmonary/Chest: Effort normal and breath sounds normal. No respiratory distress. She has no wheezes. She has no rales.  Breasts: breasts appear normal, no suspicious masses, no skin or nipple changes or axillary nodes.  Abdominal: Soft. Bowel sounds are normal. She exhibits no distension. There is no tenderness. There is no rebound and no guarding.  Musculoskeletal: She exhibits no edema.  B/l Knees: Normal to inspection with no erythema or effusion or obvious bony abnormalities.  Palpation normal with no warmth or joint line  tenderness or patellar tenderness or condyle tenderness. ROM normal in flexion and extension and lower leg rotation. +crepitus Ligaments with solid consistent endpoints including ACL, PCL, LCL, MCL. Patellar and quadriceps tendons unremarkable. Hamstring and quadriceps strength is normal.  R shoulder: Normal ROM, no TTP currently. Negative impingement testing. Strength intact  Lymphadenopathy:    She has no cervical adenopathy.  Neurological: She is alert and oriented to person, place, and time. No cranial nerve deficit.  Skin: Skin is warm and dry. No rash noted.  Psychiatric: She has a normal mood and affect. Her behavior is normal.     Depression Screen PHQ 2/9 Scores 04/11/2017 04/11/2017 03/12/2016  PHQ - 2 Score 1 1 1   PHQ- 9 Score 3 - -      Assessment & Plan:     Routine Health Maintenance and Physical Exam  Exercise Activities and Dietary recommendations Goals    . Reduce sugar in daily diet          Recommend cutting out all extra sugars in daily diet, no more sweets.        Immunization History  Administered Date(s) Administered  . Influenza, High Dose Seasonal PF 06/22/2015, 06/07/2016  . Pneumococcal Conjugate-13 04/11/2017  .  Pneumococcal Polysaccharide-23 05/18/2013  . Td 12/29/2004    Health Maintenance  Topic Date Due  . MAMMOGRAM  01/12/1995  . INFLUENZA VACCINE  03/13/2017  . COLONOSCOPY  01/22/2018 (Originally 01/12/1995)  . TETANUS/TDAP  03/13/2018 (Originally 12/30/2014)  . DEXA SCAN  08/13/2026 (Originally 01/11/2010)  . Hepatitis C Screening  Completed  . PNA vac Low Risk Adult  Completed     Discussed health benefits of physical activity, and encouraged her to engage in regular exercise appropriate for her age and condition.   Healthcare maintenance Mammogram ordered Patient refuses colon cancer screening and DEXA at this time Influenza shot given today Next CPE in 1 yr  HLD (hyperlipidemia) Stable Continue lovastatin Recheck lipid panel, CMP  Elevated hemoglobin A1c Recheck A1c Discussed diet and exercise  Obesity Discussed diet and exercise  Adult hypothyroidism Stable Continue synthroid at current dose Recheck TSH  Essential (primary) hypertension Well controlled Continue current medications Check CMP  Bursitis of right shoulder Symptoms c/w bursitis Could consider Steroid injection, but not today as she is getting 2 knee injections Continue NSAIDs - warned on potential consequences of long term therapy with NSAIDs F/u in 1 month  Arthritis, degenerative 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 Portions of this information were initially documented by Irving Burton Ratchford, CMA and reviewed by me for thoroughness and  accuracy.    Shirlee Latch, MD  [facility]

## 2017-04-11 NOTE — Progress Notes (Signed)
Subjective:   Tara Potter is a 71 y.o. female who presents for Medicare Annual (Subsequent) preventive examination.  Review of Systems:  N/A  Cardiac Risk Factors include: advanced age (>76men, >59 women);dyslipidemia;hypertension;obesity (BMI >30kg/m2)     Objective:     Vitals: BP 122/72 (BP Location: Left Arm)   Pulse 76   Temp 98.8 F (37.1 C) (Oral)   Ht 5\' 10"  (1.778 m)   Wt (!) 330 lb (149.7 kg)   BMI 47.35 kg/m   Body mass index is 47.35 kg/m.   Tobacco History  Smoking Status  . Never Smoker  Smokeless Tobacco  . Never Used     Counseling given: Not Answered   Past Medical History:  Diagnosis Date  . Arthritis   . Hyperlipidemia   . Hypertension    Past Surgical History:  Procedure Laterality Date  . DILATION AND CURETTAGE OF UTERUS    . TUBAL LIGATION     Family History  Problem Relation Age of Onset  . Hypertension Mother   . Macular degeneration Mother   . Atrial fibrillation Mother   . Kidney disease Mother   . Heart disease Father   . Arthritis Sister    History  Sexual Activity  . Sexual activity: Not on file    Outpatient Encounter Prescriptions as of 04/11/2017  Medication Sig  . bisoprolol-hydrochlorothiazide (ZIAC) 5-6.25 MG tablet Take 1 tablet by mouth daily.  . diclofenac (VOLTAREN) 75 MG EC tablet Take 75 mg by mouth daily.   . fluticasone (FLONASE) 50 MCG/ACT nasal spray Place 2 sprays into the nose daily.   Marland Kitchen levothyroxine (SYNTHROID, LEVOTHROID) 75 MCG tablet Take 1 tablet (75 mcg total) by mouth daily.  Marland Kitchen lovastatin (MEVACOR) 20 MG tablet TAKE ONE TABLET BY MOUTH AT BEDTIME  . Multiple Vitamins-Minerals (MULTIVITAL PO) Take by mouth daily.  . [DISCONTINUED] Ascorbic Acid (VITAMIN C) 100 MG tablet Take by mouth.  . [DISCONTINUED] aspirin 81 MG tablet Take by mouth.  . [DISCONTINUED] cholecalciferol (VITAMIN D) 1000 UNITS tablet Take by mouth.  . [DISCONTINUED] fexofenadine (ALLEGRA) 180 MG tablet FEXOFENADINE HCL,  180MG  (Oral Tablet)  1 Every Day for 0 days  Quantity: 30.00;  Refills: 12   Ordered :10-Oct-2010  Mila Merry MD;  Started 26-Apr-2009 Active Comments: DX: 477.0   No facility-administered encounter medications on file as of 04/11/2017.     Activities of Daily Living In your present state of health, do you have any difficulty performing the following activities: 04/11/2017  Hearing? Y  Vision? N  Difficulty concentrating or making decisions? N  Walking or climbing stairs? Y  Dressing or bathing? N  Doing errands, shopping? N  Preparing Food and eating ? N  Using the Toilet? N  In the past six months, have you accidently leaked urine? Y  Comment occasionally, wears protection  Do you have problems with loss of bowel control? N  Managing your Medications? N  Managing your Finances? N  Housekeeping or managing your Housekeeping? N  Some recent data might be hidden    Patient Care Team: Erasmo Downer, MD as PCP - General (Family Medicine) Dingeldein, Viviann Spare, MD as Consulting Physician (Ophthalmology)    Assessment:     Exercise Activities and Dietary recommendations Current Exercise Habits: The patient does not participate in regular exercise at present, Exercise limited by: orthopedic condition(s)  Goals    . Reduce sugar in daily diet          Recommend cutting out  all extra sugars in daily diet, no more sweets.       Fall Risk Fall Risk  04/11/2017 03/12/2016  Falls in the past year? No No   Depression Screen PHQ 2/9 Scores 04/11/2017 04/11/2017 03/12/2016  PHQ - 2 Score 1 1 1   PHQ- 9 Score 3 - -     Cognitive Function- Pt declined screening today.        Immunization History  Administered Date(s) Administered  . Influenza, High Dose Seasonal PF 06/22/2015, 06/07/2016  . Pneumococcal Conjugate-13 04/11/2017  . Pneumococcal Polysaccharide-23 05/18/2013  . Td 12/29/2004   Screening Tests Health Maintenance  Topic Date Due  . MAMMOGRAM   01/12/1995  . INFLUENZA VACCINE  03/13/2017  . COLONOSCOPY  01/22/2018 (Originally 01/12/1995)  . TETANUS/TDAP  03/13/2018 (Originally 12/30/2014)  . DEXA SCAN  08/13/2026 (Originally 01/11/2010)  . Hepatitis C Screening  Completed  . PNA vac Low Risk Adult  Completed      Plan:  I have personally reviewed and addressed the Medicare Annual Wellness questionnaire and have noted the following in the patient's chart:  A. Medical and social history B. Use of alcohol, tobacco or illicit drugs  C. Current medications and supplements D. Functional ability and status E.  Nutritional status F.  Physical activity G. Advance directives H. List of other physicians I.  Hospitalizations, surgeries, and ER visits in previous 12 months J.  Vitals K. Screenings such as hearing and vision if needed, cognitive and depression L. Referrals and appointments - none  In addition, I have reviewed and discussed with patient certain preventive protocols, quality metrics, and best practice recommendations. A written personalized care plan for preventive services as well as general preventive health recommendations were provided to patient.  See attached scanned questionnaire for additional information.   Signed,  Hyacinth MeekerMckenzie Rowe Warman, LPN Nurse Health Advisor   MD Recommendations: Pt needs to discuss referral about diagnostic mammogram. Pt declined colonoscopy referral, DEXA referral, tetanus vaccine and flu vaccine today.

## 2017-04-11 NOTE — Assessment & Plan Note (Signed)
Stable Continue synthroid at current dose Recheck TSH

## 2017-04-11 NOTE — Assessment & Plan Note (Signed)
Mammogram ordered Patient refuses colon cancer screening and DEXA at this time Influenza shot given today Next CPE in 1 yr

## 2017-04-12 ENCOUNTER — Telehealth: Payer: Self-pay

## 2017-04-12 LAB — CBC WITH DIFFERENTIAL/PLATELET
Basophils Absolute: 0.1 10*3/uL (ref 0.0–0.2)
Basos: 1 %
EOS (ABSOLUTE): 0.3 10*3/uL (ref 0.0–0.4)
EOS: 2 %
HEMATOCRIT: 42.6 % (ref 34.0–46.6)
HEMOGLOBIN: 13.9 g/dL (ref 11.1–15.9)
IMMATURE GRANS (ABS): 0 10*3/uL (ref 0.0–0.1)
Immature Granulocytes: 0 %
LYMPHS: 35 %
Lymphocytes Absolute: 3.7 10*3/uL — ABNORMAL HIGH (ref 0.7–3.1)
MCH: 28.8 pg (ref 26.6–33.0)
MCHC: 32.6 g/dL (ref 31.5–35.7)
MCV: 88 fL (ref 79–97)
MONOCYTES: 8 %
Monocytes Absolute: 0.8 10*3/uL (ref 0.1–0.9)
NEUTROS ABS: 5.8 10*3/uL (ref 1.4–7.0)
Neutrophils: 54 %
Platelets: 314 10*3/uL (ref 150–379)
RBC: 4.83 x10E6/uL (ref 3.77–5.28)
RDW: 13.8 % (ref 12.3–15.4)
WBC: 10.7 10*3/uL (ref 3.4–10.8)

## 2017-04-12 LAB — HEMOGLOBIN A1C
Est. average glucose Bld gHb Est-mCnc: 120 mg/dL
Hgb A1c MFr Bld: 5.8 % — ABNORMAL HIGH (ref 4.8–5.6)

## 2017-04-12 LAB — COMPREHENSIVE METABOLIC PANEL
ALBUMIN: 4.3 g/dL (ref 3.5–4.8)
ALT: 20 IU/L (ref 0–32)
AST: 16 IU/L (ref 0–40)
Albumin/Globulin Ratio: 1.4 (ref 1.2–2.2)
Alkaline Phosphatase: 111 IU/L (ref 39–117)
BUN / CREAT RATIO: 20 (ref 12–28)
BUN: 19 mg/dL (ref 8–27)
Bilirubin Total: 0.3 mg/dL (ref 0.0–1.2)
CO2: 24 mmol/L (ref 20–29)
CREATININE: 0.95 mg/dL (ref 0.57–1.00)
Calcium: 10.2 mg/dL (ref 8.7–10.3)
Chloride: 96 mmol/L (ref 96–106)
GFR, EST AFRICAN AMERICAN: 69 mL/min/{1.73_m2} (ref >59)
GFR, EST NON AFRICAN AMERICAN: 60 mL/min/{1.73_m2} (ref >59)
GLOBULIN, TOTAL: 3 g/dL (ref 1.5–4.5)
GLUCOSE: 108 mg/dL — AB (ref 65–99)
Potassium: 4.2 mmol/L (ref 3.5–5.2)
Sodium: 140 mmol/L (ref 134–144)
TOTAL PROTEIN: 7.3 g/dL (ref 6.0–8.5)

## 2017-04-12 LAB — LIPID PANEL
CHOL/HDL RATIO: 4.5 ratio — AB (ref 0.0–4.4)
Cholesterol, Total: 174 mg/dL (ref 100–199)
HDL: 39 mg/dL — AB (ref >39)
LDL Calculated: 91 mg/dL (ref 0–99)
TRIGLYCERIDES: 220 mg/dL — AB (ref 0–149)
VLDL Cholesterol Cal: 44 mg/dL — ABNORMAL HIGH (ref 5–40)

## 2017-04-12 LAB — TSH: TSH: 4.48 u[IU]/mL (ref 0.450–4.500)

## 2017-04-12 LAB — HEPATITIS C ANTIBODY: Hep C Virus Ab: <0.1 s/co ratio (ref 0.0–0.9)

## 2017-04-12 NOTE — Telephone Encounter (Signed)
Pt advised.

## 2017-04-12 NOTE — Telephone Encounter (Signed)
-----   Message from Erasmo DownerAngela M Bacigalupo, MD sent at 04/12/2017  8:42 AM EDT ----- Normal Blood counts, kidney function, liver function, electrolytes, Thyroid function.  Cholesterol is good, better than last year.  A1c remains in the pre-diabetes range, but is a little better than last year (5.9 --> 5.8).   Low carb diet will help this to not progress to diabetes.  Negative Hep C screening.  Erasmo DownerBacigalupo, Angela M, MD, MPH [facility] 04/12/2017 8:42 AM

## 2017-05-13 ENCOUNTER — Telehealth: Payer: Self-pay | Admitting: Family Medicine

## 2017-05-13 MED ORDER — DICLOFENAC SODIUM 75 MG PO TBEC: 75.0000 mg | DELAYED_RELEASE_TABLET | Freq: Two times a day (BID) | ORAL | 2 refills | Status: DC

## 2017-05-13 NOTE — Telephone Encounter (Signed)
Pt advised.

## 2017-05-13 NOTE — Telephone Encounter (Signed)
Pt needs A new refill on her diclofenac  twice a day  She uses [facility] [address]  [employer]

## 2017-05-13 NOTE — Telephone Encounter (Signed)
Refill sent to pharmacy.  Please let patient know  Erasmo Downer, MD, MPH [facility] 05/13/2017 11:48 AM

## 2017-05-13 NOTE — Telephone Encounter (Signed)
Saw you 04/11/2017. Originally filled by rheumatology for bilateral knee pain (OA), DDD (lumbar).

## 2017-06-17 ENCOUNTER — Other Ambulatory Visit: Payer: Self-pay | Admitting: Physician Assistant

## 2017-06-24 ENCOUNTER — Ambulatory Visit
Admission: RE | Admit: 2017-06-24 | Discharge: 2017-06-24 | Disposition: A | Discharge status: Home / Self Care | Payer: Medicare Other | Source: Ambulatory Visit | Attending: Family Medicine | Admitting: Family Medicine

## 2017-06-24 DIAGNOSIS — M170 Bilateral primary osteoarthritis of knee: Secondary | ICD-10-CM | POA: Insufficient documentation

## 2017-06-24 NOTE — Addendum Note (Signed)
Addended by: Anson OregonPRESLEY, RACHELLE L on: 06/24/2017 03:06 PM   Modules accepted: Orders

## 2017-06-25 ENCOUNTER — Telehealth: Payer: Self-pay

## 2017-06-25 NOTE — Telephone Encounter (Signed)
Pt advised.

## 2017-06-25 NOTE — Telephone Encounter (Signed)
-----   Message from Erasmo DownerAngela M Bacigalupo, MD sent at 06/25/2017  8:20 AM EST ----- Arthritis in both knees.  Erasmo DownerBacigalupo, Angela M, MD, MPH [facility] 06/25/2017 8:20 AM

## 2017-07-02 MED ORDER — LIDOCAINE HCL (PF) 1 % IJ SOLN
0.5000 mL | Freq: Once | INTRAMUSCULAR | Status: AC
  Administered 2017-04-11: 0.5 mL via INTRADERMAL

## 2017-07-02 MED ORDER — METHYLPREDNISOLONE ACETATE 40 MG/ML IJ SUSP
40.0000 mg | Freq: Once | INTRAMUSCULAR | Status: AC
  Administered 2017-04-11: 40 mg via INTRAMUSCULAR

## 2017-07-02 NOTE — Addendum Note (Signed)
Addended by: Tobi BastosATCHFORD, Elon Eoff M on: 07/02/2017 11:42 AM   Modules accepted: Orders

## 2017-07-08 ENCOUNTER — Ambulatory Visit
Admission: RE | Admit: 2017-07-08 | Discharge: 2017-07-08 | Disposition: A | Discharge status: Home / Self Care | Payer: Medicare Other | Source: Ambulatory Visit | Attending: Family Medicine | Admitting: Family Medicine

## 2017-07-08 DIAGNOSIS — Z1231 Encounter for screening mammogram for malignant neoplasm of breast: Secondary | ICD-10-CM | POA: Insufficient documentation

## 2017-07-09 LAB — HM MAMMOGRAPHY

## 2017-07-10 ENCOUNTER — Telehealth: Payer: Self-pay

## 2017-07-10 NOTE — Telephone Encounter (Signed)
Pt advised.

## 2017-07-10 NOTE — Telephone Encounter (Signed)
-----   Message from Erasmo DownerAngela M Bacigalupo, MD sent at 07/10/2017  1:36 PM EST ----- Normal mammogram  Bacigalupo, Marzella SchleinAngela M, MD, MPH [facility] 07/10/2017 1:36 PM

## 2017-07-12 ENCOUNTER — Encounter: Payer: Self-pay | Admitting: Family Medicine

## 2017-07-15 ENCOUNTER — Ambulatory Visit (INDEPENDENT_AMBULATORY_CARE_PROVIDER_SITE_OTHER): Payer: Medicare Other | Admitting: Family Medicine

## 2017-07-15 ENCOUNTER — Encounter: Payer: Self-pay | Admitting: Family Medicine

## 2017-07-15 VITALS — BP 118/74 | HR 72 | Temp 97.8°F | Resp 16 | Wt 324.0 lb

## 2017-07-15 DIAGNOSIS — M170 Bilateral primary osteoarthritis of knee: Secondary | ICD-10-CM

## 2017-07-15 DIAGNOSIS — Z5320 Procedure and treatment not carried out because of patient's decision for unspecified reasons: Secondary | ICD-10-CM | POA: Diagnosis not present

## 2017-07-15 DIAGNOSIS — M47816 Spondylosis without myelopathy or radiculopathy, lumbar region: Secondary | ICD-10-CM

## 2017-07-15 DIAGNOSIS — Z6841 Body Mass Index (BMI) 40.0 and over, adult: Secondary | ICD-10-CM

## 2017-07-15 MED ORDER — LIDOCAINE-EPINEPHRINE 2 %-1:100000 IJ SOLN
4.0000 mL | Freq: Once | INTRAMUSCULAR | Status: AC
  Administered 2017-07-15: 4 mL

## 2017-07-15 MED ORDER — METHYLPREDNISOLONE ACETATE 80 MG/ML IJ SUSP
80.0000 mg | Freq: Once | INTRAMUSCULAR | Status: AC
  Administered 2017-07-15: 80 mg via INTRAMUSCULAR

## 2017-07-15 MED ORDER — METHYLPREDNISOLONE ACETATE 80 MG/ML IJ SUSP
80.0000 mg | Freq: Once | INTRAMUSCULAR | Status: AC
  Administered 2017-07-15: 80 mg

## 2017-07-15 NOTE — Patient Instructions (Signed)

## 2017-07-15 NOTE — Progress Notes (Signed)
Patient: Tara Potter Female    DOB: 03/05/1946   71 y.o.   MRN: [id number] Visit Date: 07/15/2017  Today's Provider: Shirlee LatchAngela Bacigalupo, MD   Chief Complaint  Patient presents with  . Knee Pain  . Back Pain   Subjective:        Follow up for Osteoarthritis of Bilateral Knees  The patient was last seen for this 3 months ago. Changes made at last visit include administering steroid injection.  She feels that condition is slightly improved, as long as she keeps her knees warm. She states the injection greatly improved the pain for the first month. She states she did not have to take any diclofenac during that period.   ------------------------------------------------------------------------------------    Back Pain  Progression since onset: more noticable since having knee injections. Pain location: mid low back. Radiates to: right leg at times. Pain scale: pain can get as high as an 8/10. The symptoms are aggravated by standing (walking). Associated symptoms include leg pain. Pertinent negatives include no abdominal pain, bladder incontinence, bowel incontinence, chest pain, dysuria, headaches, numbness, paresthesias or tingling. Risk factors include obesity. Treatments tried: diclofenac gel. The treatment provided mild relief.  Pt states sitting improved the pain. She states a hot bath greatly improves the pain, for a short time.  Hurts worse after walking on concrete floors for 10-15 minutes.  Better with sitting down for 5-10 minutes.  Has "trouble with my sciatic on my R side.  Can awaken her at night and she will need to change positions.  Has been a problem intermittently for at least 2 years.  No bowel incontinence, some baseline urge incontinence of urine and nocturia (at baseline for her).   Tried PT many years ago for knee pain, but insurance coverage was an issue.  Saw Rheum in 02/2017 who recommended conservative measures and weight loss for DJD, OA of multiple  joints.  Discussed colon cancer screening with patient.  She refuses colonoscopy and cologuard or other colon cancer screening at this time.  She states that she might get it in the future, but needs to think more about it.    Allergies  Allergen Reactions  . Amoxicillin Diarrhea  . Entex T [Pseudoephedrine-Guaifenesin]     keeps awake; pt states she still takes this  . Erythromycin Diarrhea     Current Outpatient Medications:  .  aspirin EC 81 MG tablet, Take 81 mg by mouth every other day., Disp: , Rfl:  .  bisoprolol-hydrochlorothiazide (ZIAC) 5-6.25 MG tablet, Take 1 tablet by mouth daily., Disp: 90 tablet, Rfl: 3 .  diclofenac (VOLTAREN) 75 MG EC tablet, Take 1 tablet (75 mg total) by mouth 2 (two) times daily., Disp: 60 tablet, Rfl: 2 .  fluticasone (FLONASE) 50 MCG/ACT nasal spray, Place 2 sprays into the nose every other day as needed. , Disp: , Rfl:  .  levothyroxine (SYNTHROID, LEVOTHROID) 75 MCG tablet, TAKE ONE TABLET BY MOUTH ONCE DAILY, Disp: 90 tablet, Rfl: 1 .  lovastatin (MEVACOR) 20 MG tablet, Take 1 tablet (20 mg total) by mouth at bedtime., Disp: 90 tablet, Rfl: 1 .  Multiple Vitamins-Minerals (MULTIVITAL PO), Take by mouth daily., Disp: , Rfl:   Review of Systems  Cardiovascular: Negative for chest pain.  Gastrointestinal: Negative for abdominal pain and bowel incontinence.  Genitourinary: Negative for bladder incontinence and dysuria.  Musculoskeletal: Positive for arthralgias and back pain.  Neurological: Negative for tingling, numbness, headaches and paresthesias.  Social History   Tobacco Use  . Smoking status: Never Smoker  . Smokeless tobacco: Never Used  Substance Use Topics  . Alcohol use: Yes    Comment: rare- taste testing   Objective:   BP 118/74 (BP Location: Left Arm, Patient Position: Sitting, Cuff Size: Large)   Pulse 72   Temp 97.8 F (36.6 C) (Oral)   Resp 16   Wt (!) 324 lb (147 kg)   BMI 47.85 kg/m  Vitals:   07/15/17 1533    BP: 118/74  Pulse: 72  Resp: 16  Temp: 97.8 F (36.6 C)  TempSrc: Oral  Weight: (!) 324 lb (147 kg)     Physical Exam  Constitutional: She is oriented to person, place, and time. She appears well-developed and well-nourished. No distress.  HENT:  Head: Normocephalic and atraumatic.  Eyes: Conjunctivae are normal. No scleral icterus.  Cardiovascular: Normal rate, regular rhythm, normal heart sounds and intact distal pulses.  No murmur heard. Pulmonary/Chest: Effort normal and breath sounds normal. No respiratory distress. She has no rales.  Musculoskeletal:  B/l knees: ROM slightly limited in flexion and extension, difficult to appreciate edema/effusion due to body habitus. No TTP over joint line, patellar/quad tendons, strength intact Back: No midline or paraspinal tenderness, negative SLR b/l.  Strength and sensation intact in LEs  Neurological: She is alert and oriented to person, place, and time.  Skin: Skin is warm and dry. No rash noted.  Psychiatric: She has a normal mood and affect. Her behavior is normal.  Vitals reviewed.      Assessment & Plan:      Problem List Items Addressed This Visit      Musculoskeletal and Integument   Arthritis, degenerative - Primary    Known OA in b/l knees as well as T/L spine Repeat steroid injections in b/l knees again today Discussed that we could continue these no more than every 3 months She is unlikely to be a candidate for TKR given morbid obesity Advised on weight loss and HEP given Can use voltaren prn F/u in 3-6 months      Relevant Medications   aspirin EC 81 MG tablet   methylPREDNISolone acetate (DEPO-MEDROL) injection 80 mg (Completed)   lidocaine-EPINEPHrine (XYLOCAINE W/EPI) 2 %-1:100000 (with pres) injection 4 mL (Completed)   methylPREDNISolone acetate (DEPO-MEDROL) injection 80 mg (Completed)   lidocaine-EPINEPHrine (XYLOCAINE W/EPI) 2 %-1:100000 (with pres) injection 4 mL (Completed)   DJD (degenerative joint  disease), lumbar    Known degenerative disc disease Last L spine XRay from 2013 reviewed No red flag symptoms, no radiculopathy Discussed possibility of Ortho referral for eval for ESIs - declined at this time Discussed staying active      Relevant Medications   aspirin EC 81 MG tablet   methylPREDNISolone acetate (DEPO-MEDROL) injection 80 mg (Completed)   methylPREDNISolone acetate (DEPO-MEDROL) injection 80 mg (Completed)     Other   Obesity    Discussed diet and exercise  Patient has lost 6 lbs in last 3 months since she was able to be more active after knee injections       Other Visit Diagnoses    Colonoscopy refused          INJECTION: Patient was given informed consent, signed copy in the chart. Appropriate time out was taken. Area prepped and draped in usual sterile fashion. 1 cc of methylprednisolone 80 mg/ml plus  4 cc of 1% lidocaine with epinephrine was injected into the b/l knees using a(n) anterolateral  approach. The patient tolerated the procedure well. There were no complications. Post procedure instructions were given.   Return in about 3 months (around 10/13/2017) for OA, thyroid.     The entirety of the information documented in the History of Present Illness, Review of Systems and Physical Exam were personally obtained by me. Portions of this information were initially documented by Irving Burton Ratchford, CMA and reviewed by me for thoroughness and accuracy.     Shirlee Latch, MD  [facility]

## 2017-07-15 NOTE — Assessment & Plan Note (Signed)
Known degenerative disc disease Last L spine XRay from 2013 reviewed No red flag symptoms, no radiculopathy Discussed possibility of Ortho referral for eval for ESIs - declined at this time Discussed staying active

## 2017-07-15 NOTE — Assessment & Plan Note (Signed)
Discussed diet and exercise  Patient has lost 6 lbs in last 3 months since she was able to be more active after knee injections

## 2017-07-15 NOTE — Assessment & Plan Note (Signed)
Known OA in b/l knees as well as T/L spine Repeat steroid injections in b/l knees again today Discussed that we could continue these no more than every 3 months She is unlikely to be a candidate for TKR given morbid obesity Advised on weight loss and HEP given Can use voltaren prn F/u in 3-6 months

## 2017-10-08 ENCOUNTER — Encounter: Payer: Self-pay | Admitting: Physician Assistant

## 2017-10-08 ENCOUNTER — Ambulatory Visit: Payer: Medicare Other | Admitting: Physician Assistant

## 2017-10-08 VITALS — BP 136/72 | HR 64 | Temp 98.1°F | Resp 16 | Wt 324.0 lb

## 2017-10-08 DIAGNOSIS — R350 Frequency of micturition: Secondary | ICD-10-CM | POA: Diagnosis not present

## 2017-10-08 LAB — POCT URINALYSIS DIPSTICK
Glucose, UA: NEGATIVE
Spec Grav, UA: >=1.03 — AB (ref 1.010–1.025)
pH, UA: 5 (ref 5.0–8.0)

## 2017-10-08 MED ORDER — CEPHALEXIN 500 MG PO CAPS: 500.0000 mg | ORAL_CAPSULE | Freq: Two times a day (BID) | ORAL | 0 refills | Status: AC

## 2017-10-08 NOTE — Progress Notes (Signed)
[facility]  Chief Complaint  Patient presents with  . Urinary Tract Infection    Started a week ago    Subjective:    Patient ID: Tara Potter, female    DOB: March 10, 1946, 72 y.o.   MRN: [id number]   Urinary Tract Infection: Patient complains of dysuria, frequency, nocturia and urgency She has had symptoms for 1 week. Patient denies stomach ache and vaginal discharge. Patient does have a history of recurrent UTI. No fevers or chills. No pelvic pain. No nausea or vomiting. Patient does not have a history of pyelonephritis or other renal issues. Patient denies vaginal discharge and denies new sexual partners. The patient denies recent travel outside of the [place]. Has been taking azo tablets with some relief but her symptoms restarted when she stopped taking azo.  Review of Systems  Constitutional: Positive for malaise/fatigue. Negative for chills, diaphoresis, fever and weight loss.  Gastrointestinal: Negative.   Genitourinary: Positive for dysuria, frequency and urgency. Negative for flank pain and hematuria.  Neurological: Negative for dizziness, weakness and headaches.       Objective:   BP 136/72 (BP Location: Right Wrist, Patient Position: Sitting, Cuff Size: Normal)   Pulse 64   Temp 98.1 F (36.7 C) (Oral)   Resp 16   Wt (!) 324 lb (147 kg)   BMI 47.85 kg/m   Patient Active Problem List   Diagnosis Date Noted  . DJD (degenerative joint disease), lumbar 07/15/2017  . Bursitis of right shoulder 04/11/2017  . Obesity 04/11/2017  . Healthcare maintenance 04/11/2017  . Elevated hemoglobin A1c 03/14/2016  . Arthritis, degenerative 09/29/2008  . Essential (primary) hypertension 02/25/2008  . Cardiac murmur 01/29/2008  . HLD (hyperlipidemia) 08/13/1998  . Adult hypothyroidism 08/13/1998  . Hay fever 08/13/1998    Outpatient Encounter Medications as of 10/08/2017  Medication Sig Note  . aspirin EC 81 MG tablet Take 81 mg by  mouth every other day.   . bisoprolol-hydrochlorothiazide (ZIAC) 5-6.25 MG tablet Take 1 tablet by mouth daily.   . diclofenac (VOLTAREN) 75 MG EC tablet Take 1 tablet (75 mg total) by mouth 2 (two) times daily.   . fluticasone (FLONASE) 50 MCG/ACT nasal spray Place 2 sprays into the nose every other day as needed.  01/24/2015: Medication taken as needed.  Received from: [employer]  . levothyroxine (SYNTHROID, LEVOTHROID) 75 MCG tablet TAKE ONE TABLET BY MOUTH ONCE DAILY   . lovastatin (MEVACOR) 20 MG tablet Take 1 tablet (20 mg total) by mouth at bedtime.   . Multiple Vitamins-Minerals (MULTIVITAL PO) Take by mouth daily.   . cephALEXin (KEFLEX) 500 MG capsule Take 1 capsule (500 mg total) by mouth 2 (two) times daily for 7 days.    No facility-administered encounter medications on file as of 10/08/2017.     Allergies  Allergen Reactions  . Amoxicillin Diarrhea  . Entex T [Pseudoephedrine-Guaifenesin]     keeps awake; pt states she still takes this  . Erythromycin Diarrhea       Physical Exam  Constitutional: She is oriented to person, place, and time. She appears well-developed and well-nourished. No distress.  Cardiovascular: Normal rate and regular rhythm.  Pulmonary/Chest: Effort normal and breath sounds normal.  Abdominal: Soft. Bowel sounds are normal. She exhibits no distension. There is tenderness in the suprapubic area. There is no rebound, no guarding and no CVA tenderness.  Neurological: She is alert and oriented to person, place, and time.  Skin: Skin is  warm and dry. She is not diaphoretic.  Psychiatric: She has a normal mood and affect. Her behavior is normal.       Assessment & Plan:  1. Urinary frequency  Patient would like to be treated empirically for cystitis rather than waiting for culture. Her allergy to amoxicillin is diarrhea and she has tolerate keflex before. She is wary of bactrim as her son has anaphylactic allergy. Follow up as needed.    - POCT Urinalysis Dipstick - CULTURE, URINE COMPREHENSIVE - cephALEXin (KEFLEX) 500 MG capsule; Take 1 capsule (500 mg total) by mouth 2 (two) times daily for 7 days.  Dispense: 14 capsule; Refill: 0  Return if symptoms worsen or fail to improve.   The entirety of the information documented in the History of Present Illness, Review of Systems and Physical Exam were personally obtained by me. Portions of this information were initially documented by Kavin Leech, CMA and reviewed by me for thoroughness and accuracy.    Patient Instructions  Urinary Tract Infection, Adult A urinary tract infection (UTI) is an infection of any part of the urinary tract, which includes the kidneys, ureters, bladder, and urethra. These organs make, store, and get rid of urine in the body. UTI can be a bladder infection (cystitis) or kidney infection (pyelonephritis). What are the causes? This infection may be caused by fungi, viruses, or bacteria. Bacteria are the most common cause of UTIs. This condition can also be caused by repeated incomplete emptying of the bladder during urination. What increases the risk? This condition is more likely to develop if:  You ignore your need to urinate or hold urine for long periods of time.  You do not empty your bladder completely during urination.  You wipe back to front after urinating or having a bowel movement, if you are female.  You are uncircumcised, if you are female.  You are constipated.  You have a urinary catheter that stays in place (indwelling).  You have a weak defense (immune) system.  You have a medical condition that affects your bowels, kidneys, or bladder.  You have diabetes.  You take antibiotic medicines frequently or for long periods of time, and the antibiotics no longer work well against certain types of infections (antibiotic resistance).  You take medicines that irritate your urinary tract.  You are exposed to chemicals that  irritate your urinary tract.  You are female.  What are the signs or symptoms? Symptoms of this condition include:  Fever.  Frequent urination or passing small amounts of urine frequently.  Needing to urinate urgently.  Pain or burning with urination.  Urine that smells bad or unusual.  Cloudy urine.  Pain in the lower abdomen or back.  Trouble urinating.  Blood in the urine.  Vomiting or being less hungry than normal.  Diarrhea or abdominal pain.  Vaginal discharge, if you are female.  How is this diagnosed? This condition is diagnosed with a medical history and physical exam. You will also need to provide a urine sample to test your urine. Other tests may be done, including:  Blood tests.  Sexually transmitted disease (STD) testing.  If you have had more than one UTI, a cystoscopy or imaging studies may be done to determine the cause of the infections. How is this treated? Treatment for this condition often includes a combination of two or more of the following:  Antibiotic medicine.  Other medicines to treat less common causes of UTI.  Over-the-counter medicines to treat pain.  Drinking enough water to stay hydrated.  Follow these instructions at home:  Take over-the-counter and prescription medicines only as told by your health care provider.  If you were prescribed an antibiotic, take it as told by your health care provider. Do not stop taking the antibiotic even if you start to feel better.  Avoid alcohol, caffeine, tea, and carbonated beverages. They can irritate your bladder.  Drink enough fluid to keep your urine clear or pale yellow.  Keep all follow-up visits as told by your health care provider. This is important.  Make sure to: ? Empty your bladder often and completely. Do not hold urine for long periods of time. ? Empty your bladder before and after sex. ? Wipe from front to back after a bowel movement if you are female. Use each tissue  one time when you wipe. Contact a health care provider if:  You have back pain.  You have a fever.  You feel nauseous or vomit.  Your symptoms do not get better after 3 days.  Your symptoms go away and then return. Get help right away if:  You have severe back pain or lower abdominal pain.  You are vomiting and cannot keep down any medicines or water. This information is not intended to replace advice given to you by your health care provider. Make sure you discuss any questions you have with your health care provider. Document Released: 05/09/2005 Document Revised: 01/11/2016 Document Reviewed: 06/20/2015 Elsevier Interactive Patient Education  [employer]2018 Elsevier Inc.

## 2017-10-08 NOTE — Patient Instructions (Signed)

## 2017-10-11 LAB — CULTURE, URINE COMPREHENSIVE

## 2017-10-14 ENCOUNTER — Telehealth: Payer: Self-pay

## 2017-10-14 ENCOUNTER — Ambulatory Visit (INDEPENDENT_AMBULATORY_CARE_PROVIDER_SITE_OTHER): Payer: Medicare Other | Admitting: Family Medicine

## 2017-10-14 ENCOUNTER — Encounter: Payer: Self-pay | Admitting: Family Medicine

## 2017-10-14 VITALS — BP 120/70 | HR 72 | Temp 98.1°F | Resp 20 | Wt 323.0 lb

## 2017-10-14 DIAGNOSIS — R3915 Urgency of urination: Secondary | ICD-10-CM | POA: Insufficient documentation

## 2017-10-14 DIAGNOSIS — Z6841 Body Mass Index (BMI) 40.0 and over, adult: Secondary | ICD-10-CM

## 2017-10-14 DIAGNOSIS — M170 Bilateral primary osteoarthritis of knee: Secondary | ICD-10-CM | POA: Diagnosis not present

## 2017-10-14 DIAGNOSIS — H6981 Other specified disorders of Eustachian tube, right ear: Secondary | ICD-10-CM | POA: Diagnosis not present

## 2017-10-14 DIAGNOSIS — E039 Hypothyroidism, unspecified: Secondary | ICD-10-CM

## 2017-10-14 MED ORDER — FLUTICASONE PROPIONATE 50 MCG/ACT NA SUSP: 2.0000 | Freq: Every day | NASAL | 11 refills | Status: DC

## 2017-10-14 NOTE — Telephone Encounter (Signed)
-----   Message from Trey SailorsAdriana M Pollak, [place] sent at 10/11/2017  1:52 PM EST ----- Urine culture grew e. Coli that is sensitive to Keflex. I hope she is feeling better.

## 2017-10-14 NOTE — Patient Instructions (Signed)
Eustachian Tube Dysfunction The eustachian tube connects the middle ear to the back of the nose. It regulates air pressure in the middle ear by allowing air to move between the ear and nose. It also helps to drain fluid from the middle ear space. When the eustachian tube does not function properly, air pressure, fluid, or both can build up in the middle ear. Eustachian tube dysfunction can affect one or both ears. What are the causes? This condition happens when the eustachian tube becomes blocked or cannot open normally. This may result from:  Ear infections.  Colds and other upper respiratory infections.  Allergies.  Irritation, such as from cigarette smoke or acid from the stomach coming up into the esophagus (gastroesophageal reflux).  Sudden changes in air pressure, such as from descending in an airplane.  Abnormal growths in the nose or throat, such as nasal polyps, tumors, or enlarged tissue at the back of the throat (adenoids).  What increases the risk? This condition may be more likely to develop in people who smoke and people who are overweight. Eustachian tube dysfunction may also be more likely to develop in children, especially children who have:  Certain birth defects of the mouth, such as cleft palate.  Large tonsils and adenoids.  What are the signs or symptoms? Symptoms of this condition may include:  A feeling of fullness in the ear.  Ear pain.  Clicking or popping noises in the ear.  Ringing in the ear.  Hearing loss.  Loss of balance.  Symptoms may get worse when the air pressure around you changes, such as when you travel to an area of high elevation or fly on an airplane. How is this diagnosed? This condition may be diagnosed based on:  Your symptoms.  A physical exam of your ear, nose, and throat.  Tests, such as those that measure: ? The movement of your eardrum (tympanogram). ? Your hearing (audiometry).  How is this treated? Treatment  depends on the cause and severity of your condition. If your symptoms are mild, you may be able to relieve your symptoms by moving air into ("popping") your ears. If you have symptoms of fluid in your ears, treatment may include:  Decongestants.  Antihistamines.  Nasal sprays or ear drops that contain medicines that reduce swelling (steroids).  In some cases, you may need to have a procedure to drain the fluid in your eardrum (myringotomy). In this procedure, a small tube is placed in the eardrum to:  Drain the fluid.  Restore the air in the middle ear space.  Follow these instructions at home:  Take over-the-counter and prescription medicines only as told by your health care provider.  Use techniques to help pop your ears as recommended by your health care provider. These may include: ? Chewing gum. ? Yawning. ? Frequent, forceful swallowing. ? Closing your mouth, holding your nose closed, and gently blowing as if you are trying to blow air out of your nose.  Do not do any of the following until your health care provider approves: ? Travel to high altitudes. ? Fly in airplanes. ? Work in a pressurized cabin or room. ? Scuba dive.  Keep your ears dry. Dry your ears completely after showering or bathing.  Do not smoke.  Keep all follow-up visits as told by your health care provider. This is important. Contact a health care provider if:  Your symptoms do not go away after treatment.  Your symptoms come back after treatment.  You are   unable to pop your ears.  You have: ? A fever. ? Pain in your ear. ? Pain in your head or neck. ? Fluid draining from your ear.  Your hearing suddenly changes.  You become very dizzy.  You lose your balance. This information is not intended to replace advice given to you by your health care provider. Make sure you discuss any questions you have with your health care provider. Document Released: 08/26/2015 Document Revised: 01/05/2016  Document Reviewed: 08/18/2014 Elsevier Interactive Patient Education  2018 Elsevier Inc.  

## 2017-10-14 NOTE — Assessment & Plan Note (Signed)
Other symptoms have resolved, so will not recheck UA today Finish course of antibiotics Advised avoiding caffeine and drinking plenty of water Return precautions discussed

## 2017-10-14 NOTE — Assessment & Plan Note (Signed)
Stable Continue Synthroid at current dose Recheck TSH

## 2017-10-14 NOTE — Progress Notes (Signed)
Patient: Tara Potter Female    DOB: [birth year]   72 y.o.   MRN: [id number] Visit Date: 10/14/2017  Today's Provider: Shirlee Latch, MD   I, Joslyn Hy, CMA, am acting as scribe for Shirlee Latch, MD.  Chief Complaint  Patient presents with  . Arthritis  . Hypothyroidism   Subjective:    HPI     Follow up for OA  The patient was last seen for this 3 months ago. Changes made at last visit include steroid injections in bilateral knees.  She states she fell on the ice in December, and injured her LLE. She believes her knees would have responded to injections well if it weren't for the fall.  Despite her fall, pain has been significantly less in her b/l knees.  ------------------------------------------------------------------------------------  Hypothyroid, follow-up:  TSH  Date Value Ref Range Status  04/11/2017 4.480 0.450 - 4.500 uIU/mL Final  03/13/2016 4.980 (H) 0.450 - 4.500 uIU/mL Final   Wt Readings from Last 3 Encounters:  10/14/17 (!) 323 lb (146.5 kg)  10/08/17 (!) 324 lb (147 kg)  07/15/17 (!) 324 lb (147 kg)    She was last seen for hypothyroid 6 months ago.  Management since that visit includes checking labs and continuing medications. She reports excellent compliance with treatment. She is not having side effects.  She is not exercising. She is experiencing diarrhea and heat / cold intolerance. She states the loose stools are from Keflex. She denies change in energy level, nervousness, palpitations and weight changes Weight trend: stable  ------------------------------------------------------------------------ Urinary Urgency Pt was treated for an E. Coli UTI on 02/26/20219. She was treated with Keflex. She states she has 2 tablets left , and is still experiencing urinary urgency.   Pt is requesting a refill of Flonase. She is c/o right ear discomfort. Ongoing for weeks.  Some plugged ear sensation.  Some muffled hearing, but hearing  is "terrible" at baseline any ways.  Allergies  Allergen Reactions  . Amoxicillin Diarrhea  . Entex T [Pseudoephedrine-Guaifenesin]     keeps awake; pt states she still takes this  . Erythromycin Diarrhea     Current Outpatient Medications:  .  bisoprolol-hydrochlorothiazide (ZIAC) 5-6.25 MG tablet, Take 1 tablet by mouth daily., Disp: 90 tablet, Rfl: 3 .  cephALEXin (KEFLEX) 500 MG capsule, Take 1 capsule (500 mg total) by mouth 2 (two) times daily for 7 days., Disp: 14 capsule, Rfl: 0 .  diclofenac (VOLTAREN) 75 MG EC tablet, Take 1 tablet (75 mg total) by mouth 2 (two) times daily., Disp: 60 tablet, Rfl: 2 .  fluticasone (FLONASE) 50 MCG/ACT nasal spray, Place 2 sprays into the nose every other day as needed. , Disp: , Rfl:  .  levothyroxine (SYNTHROID, LEVOTHROID) 75 MCG tablet, TAKE ONE TABLET BY MOUTH ONCE DAILY, Disp: 90 tablet, Rfl: 1 .  lovastatin (MEVACOR) 20 MG tablet, Take 1 tablet (20 mg total) by mouth at bedtime., Disp: 90 tablet, Rfl: 1 .  Multiple Vitamins-Minerals (MULTIVITAL PO), Take by mouth daily., Disp: , Rfl:  .  aspirin EC 81 MG tablet, Take 81 mg by mouth every other day., Disp: , Rfl:   Review of Systems  Constitutional: Negative for activity change, appetite change, chills, diaphoresis, fatigue, fever and unexpected weight change.  Respiratory: Negative.   Cardiovascular: Negative for chest pain, palpitations and leg swelling.  Gastrointestinal: Negative.   Endocrine: Positive for cold intolerance. Negative for heat intolerance.  Genitourinary: Positive for urgency. Negative for  difficulty urinating, dysuria, frequency, pelvic pain, vaginal bleeding and vaginal discharge.  Musculoskeletal: Positive for arthralgias, back pain, gait problem and joint swelling.  Skin: Negative.     Social History   Tobacco Use  . Smoking status: Never Smoker  . Smokeless tobacco: Never Used  Substance Use Topics  . Alcohol use: Yes    Comment: rare- taste testing    Objective:   BP 120/70 (BP Location: Left Arm, Patient Position: Sitting, Cuff Size: Large)   Pulse 72   Temp 98.1 F (36.7 C) (Oral)   Resp 20   Wt (!) 323 lb (146.5 kg)   BMI 47.70 kg/m  Vitals:   10/14/17 1550  BP: 120/70  Pulse: 72  Resp: 20  Temp: 98.1 F (36.7 C)  TempSrc: Oral  Weight: (!) 323 lb (146.5 kg)     Physical Exam  Constitutional: She is oriented to person, place, and time. She appears well-developed and well-nourished. No distress.  HENT:  Head: Normocephalic and atraumatic.  Eyes: Conjunctivae are normal. No scleral icterus.  Neck: Neck supple. No thyromegaly present.  Cardiovascular: Normal rate, regular rhythm, normal heart sounds and intact distal pulses.  No murmur heard. Pulmonary/Chest: Effort normal and breath sounds normal. No respiratory distress. She has no wheezes. She has no rales.  Musculoskeletal: She exhibits no edema.  B/l knees: some swelling, no effusions discernible on exam.  No tenderness to palpation along the patella, joint lines, patellar or quadriceps tendons.  Strength intact, though painful to push against resistance.  Lymphadenopathy:    She has no cervical adenopathy.  Neurological: She is alert and oriented to person, place, and time.  Skin: Skin is warm and dry. No rash noted.  Psychiatric: She has a normal mood and affect. Her behavior is normal.  Vitals reviewed.       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 Repeat steroid injections in bilateral knees again today Discussed that we can continue these no more than every 3 months She is unlikely to be a candidate for total knee replacement given her morbid obesity Advised on weight loss and exercising Can use Voltaren as needed, but not daily Follow-up in 3-6 months        Other   Obesity    Discussed importance of diet and exercise Discussed the importance of weight loss in help with knee pain      Urinary urgency    Other symptoms have resolved, so will not recheck UA today Finish course of antibiotics Advised avoiding caffeine and drinking plenty of water Return precautions discussed         Procedure: INJECTION: Patient was given informed consent. Appropriate time out was taken. Area prepped and draped in usual sterile fashion. 1 cc of methylprednisolone 40 mg/ml plus  4 cc of 1% lidocaine with epinephrine was injected into the b/l knees using a(n)  anterior medial on L and anterolateral on R approach. The patient tolerated the procedure well. There were no complications. Post procedure instructions were given.   Return in about 3 months (around 01/14/2018) for chronic diseasse f/u.   The entirety of the information documented in the History of Present Illness, Review of Systems and Physical Exam were personally obtained by me. Portions of this information were initially documented by Irving Burton Ratchford, CMA and reviewed by me for thoroughness and accuracy.    Erasmo Downer, MD, MPH [facility] 10/14/2017 4:41 PM

## 2017-10-14 NOTE — Assessment & Plan Note (Signed)
Discussed importance of diet and exercise Discussed the importance of weight loss in help with knee pain

## 2017-10-14 NOTE — Telephone Encounter (Signed)
Pt advised.   Thanks,   -Yossef Gilkison  

## 2017-10-14 NOTE — Assessment & Plan Note (Signed)
Symptoms of right ear discomfort, plugged sensation, muffled hearing all consistent with eustachian tube dysfunction No evidence of otitis media Resume Flonase

## 2017-10-14 NOTE — Assessment & Plan Note (Signed)
Known osteoarthritis in bilateral knees as well as T and L-spine Repeat steroid injections in bilateral knees again today Discussed that we can continue these no more than every 3 months She is unlikely to be a candidate for total knee replacement given her morbid obesity Advised on weight loss and exercising Can use Voltaren as needed, but not daily Follow-up in 3-6 months

## 2017-10-15 ENCOUNTER — Telehealth: Payer: Self-pay

## 2017-10-15 LAB — TSH: TSH: 3.5 u[IU]/mL (ref 0.450–4.500)

## 2017-10-15 NOTE — Telephone Encounter (Signed)
Pt advised and verbally acknowledges understanding. 

## 2017-10-15 NOTE — Telephone Encounter (Signed)
-----   Message from Erasmo DownerAngela M Bacigalupo, MD sent at 10/15/2017 10:12 AM EST ----- Normal Thyroid function. Continue current Synthroid dose  Erasmo DownerBacigalupo, Angela M, MD, MPH [facility] 10/15/2017 10:12 AM

## 2017-12-16 ENCOUNTER — Other Ambulatory Visit: Payer: Self-pay | Admitting: Family Medicine

## 2017-12-16 MED ORDER — DICLOFENAC SODIUM 75 MG PO TBEC: 75.0000 mg | DELAYED_RELEASE_TABLET | Freq: Two times a day (BID) | ORAL | 2 refills | Status: DC

## 2017-12-16 MED ORDER — LEVOTHYROXINE SODIUM 75 MCG PO TABS: 75.0000 ug | ORAL_TABLET | Freq: Every day | ORAL | 1 refills | Status: DC

## 2017-12-16 NOTE — Telephone Encounter (Signed)
Pt needs fill on her   Levothyroxine   Diclofenac 75 mg  She uses [facility]

## 2017-12-16 NOTE — Telephone Encounter (Signed)
Please review. Thanks!  

## 2018-01-15 ENCOUNTER — Ambulatory Visit: Payer: Self-pay | Admitting: Family Medicine

## 2018-01-16 ENCOUNTER — Ambulatory Visit: Payer: Medicare Other | Admitting: Family Medicine

## 2018-01-16 ENCOUNTER — Encounter: Payer: Self-pay | Admitting: Family Medicine

## 2018-01-16 VITALS — BP 132/80 | HR 72 | Temp 98.1°F | Resp 16 | Wt 324.0 lb

## 2018-01-16 DIAGNOSIS — I10 Essential (primary) hypertension: Secondary | ICD-10-CM

## 2018-01-16 DIAGNOSIS — M170 Bilateral primary osteoarthritis of knee: Secondary | ICD-10-CM | POA: Diagnosis not present

## 2018-01-16 DIAGNOSIS — Z6841 Body Mass Index (BMI) 40.0 and over, adult: Secondary | ICD-10-CM | POA: Diagnosis not present

## 2018-01-16 DIAGNOSIS — E785 Hyperlipidemia, unspecified: Secondary | ICD-10-CM | POA: Diagnosis not present

## 2018-01-16 MED ORDER — LOVASTATIN 20 MG PO TABS: 20.0000 mg | ORAL_TABLET | Freq: Every day | ORAL | 1 refills | Status: DC

## 2018-01-16 NOTE — Patient Instructions (Signed)

## 2018-01-16 NOTE — Progress Notes (Signed)
Patient: Tara Potter Female    DOB: 07/17/1946   72 y.o.   MRN: [id number] Visit Date: 01/17/2018  Today's Provider: Shirlee LatchAngela Cherylyn Sundby, MD   I, Joslyn HyEmily Ratchford, CMA, am acting as scribe for Shirlee LatchAngela Hashem Goynes, MD.  Chief Complaint  Patient presents with  . Hypothyroidism  . Hypertension   Subjective:    HPI     Hypertension, follow-up:  BP Readings from Last 3 Encounters:  01/16/18 132/80  10/14/17 120/70  10/08/17 136/72    She was last seen for hypertension 3 months ago.  BP at that visit was 120/70. Management since that visit includes none. She reports excellent compliance with treatment. She is not having side effects.  She is not adherent to low salt diet.   Outside blood pressures are seldom checked. She is experiencing dyspnea and lower extremity edema.  Patient denies chest pain, chest pressure/discomfort, claudication, exertional chest pressure/discomfort, fatigue, irregular heart beat, near-syncope, orthopnea, palpitations and syncope.   Cardiovascular risk factors include advanced age (older than 3055 for men, 2965 for women), dyslipidemia, hypertension and obesity (BMI >= 30 kg/m2).    Weight trend: stable Wt Readings from Last 3 Encounters:  01/16/18 (!) 324 lb (147 kg)  10/14/17 (!) 323 lb (146.5 kg)  10/08/17 (!) 324 lb (147 kg)    Current diet: in general, a "healthy" diet    HLD - medications: lovastatin 20 mg daily - compliance: good - medication SEs: none - needs refill of lovastatin - also takes daily ASA ------------------------------------------------------------------------ Bilateral knee OA - got corticosteroid injections in bilateral knees 3 months ago.  States that knees have been painful for last 2-3 weeks.  No swelling or redness.  Initially got great pain relief with injections.  Thinks that walking a lot on concrete at her daughter's recital exacerbated the pain again.  These usually last 3 months.  She states that the diclofenac  helps some, but not entirely.  She states she is taking Tylenol arthritis twice daily that helps her get by.  She wonders if this is okay to take.   Allergies  Allergen Reactions  . Amoxicillin Diarrhea    Augmentin  . Entex T [Pseudoephedrine-Guaifenesin]     keeps awake; pt states she still takes this  . Erythromycin Diarrhea     Current Outpatient Medications:  .  aspirin EC 81 MG tablet, Take 81 mg by mouth every other day., Disp: , Rfl:  .  bisoprolol-hydrochlorothiazide (ZIAC) 5-6.25 MG tablet, Take 1 tablet by mouth daily., Disp: 90 tablet, Rfl: 3 .  diclofenac (VOLTAREN) 75 MG EC tablet, Take 1 tablet (75 mg total) by mouth 2 (two) times daily., Disp: 60 tablet, Rfl: 2 .  fluticasone (FLONASE) 50 MCG/ACT nasal spray, Place 2 sprays into both nostrils daily., Disp: 16 g, Rfl: 11 .  levothyroxine (SYNTHROID, LEVOTHROID) 75 MCG tablet, Take 1 tablet (75 mcg total) by mouth daily., Disp: 90 tablet, Rfl: 1 .  lovastatin (MEVACOR) 20 MG tablet, Take 1 tablet (20 mg total) by mouth at bedtime., Disp: 90 tablet, Rfl: 1 .  Multiple Vitamins-Minerals (MULTIVITAL PO), Take by mouth daily., Disp: , Rfl:   Current Facility-Administered Medications:  .  methylPREDNISolone acetate (DEPO-MEDROL) injection 80 mg, 80 mg, Intramuscular, Once, Mariem Skolnick, Marzella SchleinAngela M, MD .  methylPREDNISolone acetate (DEPO-MEDROL) injection 80 mg, 80 mg, Intramuscular, Once, Brystol Wasilewski, Marzella SchleinAngela M, MD  Review of Systems  Constitutional: Negative for activity change, appetite change, chills, diaphoresis, fatigue, fever and unexpected weight change.  Respiratory: Positive for shortness of breath (on exertion).   Cardiovascular: Positive for leg swelling. Negative for chest pain and palpitations.  Endocrine: Negative for cold intolerance and heat intolerance.  Musculoskeletal: Positive for arthralgias and back pain.    Social History   Tobacco Use  . Smoking status: Never Smoker  . Smokeless tobacco: Never Used    Substance Use Topics  . Alcohol use: Yes    Comment: rare- taste testing   Objective:   BP 132/80 (BP Location: Left Arm, Patient Position: Sitting, Cuff Size: Large)   Pulse 72   Temp 98.1 F (36.7 C) (Oral)   Resp 16   Wt (!) 324 lb (147 kg)   SpO2 93%   BMI 47.85 kg/m  Vitals:   01/16/18 1511  BP: 132/80  Pulse: 72  Resp: 16  Temp: 98.1 F (36.7 C)  TempSrc: Oral  SpO2: 93%  Weight: (!) 324 lb (147 kg)     Physical Exam  Constitutional: She is oriented to person, place, and time. She appears well-developed and well-nourished. No distress.  HENT:  Head: Normocephalic and atraumatic.  Eyes: Pupils are equal, round, and reactive to light. Conjunctivae are normal. Right eye exhibits no discharge. Left eye exhibits no discharge. No scleral icterus.  Neck: Neck supple. No thyromegaly present.  Cardiovascular: Normal rate, regular rhythm, normal heart sounds and intact distal pulses.  No murmur heard. Pulmonary/Chest: Effort normal and breath sounds normal. No respiratory distress. She has no wheezes. She has no rales.  Musculoskeletal: She exhibits no edema.  B/l knees: some swelling, no effusions discernible on exam.  No tenderness to palpation along the patella, joint lines, patellar or quadriceps tendons.  Strength intact, though painful to push against resistance.  Lymphadenopathy:    She has no cervical adenopathy.  Neurological: She is alert and oriented to person, place, and time.  Skin: Skin is warm and dry. Capillary refill takes less than 2 seconds. No rash noted.  Psychiatric: She has a normal mood and affect. Her behavior is normal.  Vitals reviewed.       Assessment & Plan:     Problem List Items Addressed This Visit      Cardiovascular and Mediastinum   Essential (primary) hypertension - Primary    Well-controlled Continue current medications Reviewed last metabolic panel Plan to recheck CMP at CPE in 3 months      Relevant Medications    lovastatin (MEVACOR) 20 MG tablet     Musculoskeletal and Integument   Arthritis, degenerative    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 Repeat steroid injections in bilateral knees again today Discussed that we can can continue these no more than every 3 months She is not currently a candidate for total knee replacement given her morbid obesity Advised on weight loss, diet, and exercise Can continue Voltaren as needed, but discussed the long-term risks of chronic NSAIDs Advised that scheduled Tylenol 2-3 times daily can be very helpful for arthritis pain Follow-up in 3 to 6 months      Relevant Medications   methylPREDNISolone acetate (DEPO-MEDROL) injection 80 mg (Start on 01/17/2018  9:00 AM)   methylPREDNISolone acetate (DEPO-MEDROL) injection 80 mg (Start on 01/17/2018  9:00 AM)     Other   HLD (hyperlipidemia)    Stable Continue lovastatin which was refilled today Plan to recheck lipid panel at upcoming CPE in 3 months      Relevant Medications   lovastatin (MEVACOR) 20 MG  tablet   Obesity    Discussed importance of healthy weight Discussed diet and exercise         INJECTION: Patient was given informed consent. Appropriate time out was taken. Area prepped and draped in usual sterile fashion. 1 cc of depomedrol 80mg /ml plus  4 cc of 1% lidocaine without epinephrine was injected into the R knee using a(n) anterolateral approach and into the L knee using an anteromedial approach. The patient tolerated the procedure well. There were no complications. Post procedure instructions were given.   Return in about 3 months (around 04/18/2018) for CPE/AWV (after 8/30).   The entirety of the information documented in the History of Present Illness, Review of Systems and Physical Exam were personally obtained by me. Portions of this information were initially documented by Irving Burton Ratchford, CMA and reviewed by me for thoroughness and accuracy.    Erasmo Downer, MD, MPH [facility] 01/17/2018 8:56 AM

## 2018-01-17 MED ORDER — METHYLPREDNISOLONE ACETATE 80 MG/ML IJ SUSP: 80.0000 mg | Freq: Once | INTRAMUSCULAR | Status: DC

## 2018-01-17 NOTE — Assessment & Plan Note (Signed)
Discussed importance of healthy weight Discussed diet and exercise 

## 2018-01-17 NOTE — Assessment & Plan Note (Signed)
Stable Continue lovastatin which was refilled today Plan to recheck lipid panel at upcoming CPE in 3 months

## 2018-01-17 NOTE — Assessment & Plan Note (Signed)
Well-controlled Continue current medications Reviewed last metabolic panel Plan to recheck CMP at CPE in 3 months

## 2018-01-17 NOTE — Assessment & Plan Note (Signed)
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 Repeat steroid injections in bilateral knees again today Discussed that we can can continue these no more than every 3 months She is not currently a candidate for total knee replacement given her morbid obesity Advised on weight loss, diet, and exercise Can continue Voltaren as needed, but discussed the long-term risks of chronic NSAIDs Advised that scheduled Tylenol 2-3 times daily can be very helpful for arthritis pain Follow-up in 3 to 6 months

## 2018-01-23 ENCOUNTER — Other Ambulatory Visit: Payer: Self-pay

## 2018-01-23 DIAGNOSIS — E785 Hyperlipidemia, unspecified: Secondary | ICD-10-CM

## 2018-01-23 MED ORDER — LOVASTATIN 20 MG PO TABS: 20.0000 mg | ORAL_TABLET | Freq: Every day | ORAL | 1 refills | Status: DC

## 2018-03-17 ENCOUNTER — Other Ambulatory Visit: Payer: Self-pay | Admitting: Family Medicine

## 2018-04-07 ENCOUNTER — Other Ambulatory Visit: Payer: Self-pay | Admitting: Family Medicine

## 2018-04-07 DIAGNOSIS — I10 Essential (primary) hypertension: Secondary | ICD-10-CM

## 2018-04-21 ENCOUNTER — Ambulatory Visit (INDEPENDENT_AMBULATORY_CARE_PROVIDER_SITE_OTHER): Payer: Medicare Other

## 2018-04-21 VITALS — BP 144/66 | HR 65 | Temp 97.7°F | Ht 70.0 in | Wt 321.2 lb

## 2018-04-21 DIAGNOSIS — Z0000 Encounter for general adult medical examination without abnormal findings: Secondary | ICD-10-CM | POA: Diagnosis not present

## 2018-04-21 NOTE — Patient Instructions (Addendum)
[name] , Thank you for taking time to come for your Medicare Wellness Visit. I appreciate your ongoing commitment to your health goals. Please review the following plan we discussed and let me know if I can assist you in the future.   Screening recommendations/referrals: Colonoscopy: Pt declines today.  Mammogram: Up to date Bone Density: Pt declines today.  Recommended yearly ophthalmology/optometry visit for glaucoma screening and checkup Recommended yearly dental visit for hygiene and checkup  Vaccinations: Influenza vaccine: Pt declines today.  Pneumococcal vaccine: Up to date Tdap vaccine: Up to date Shingles vaccine: Pt declines today.     Advanced directives: Advance directive discussed with you today. Even though you declined this today please call our office should you change your mind and we can give you the proper paperwork for you to fill out.  Conditions/risks identified: Obesity-  Recommend to cut out all sweets in diet and substitute for fruit when having a craving.   Next appointment: 04/28/18 @ 2 PM with Dr Beryle Flock.    Preventive Care 73 Years and Older, Female Preventive care refers to lifestyle choices and visits with your health care provider that can promote health and wellness. What does preventive care include?  A yearly physical exam. This is also called an annual well check.  Dental exams once or twice a year.  Routine eye exams. Ask your health care provider how often you should have your eyes checked.  Personal lifestyle choices, including:  Daily care of your teeth and gums.  Regular physical activity.  Eating a healthy diet.  Avoiding tobacco and drug use.  Limiting alcohol use.  Practicing safe sex.  Taking low-dose aspirin every day.  Taking vitamin and mineral supplements as recommended by your health care provider. What happens during an annual well check? The services and screenings done by your health care provider during your  annual well check will depend on your age, overall health, lifestyle risk factors, and family history of disease. Counseling  Your health care provider may ask you questions about your:  Alcohol use.  Tobacco use.  Drug use.  Emotional well-being.  Home and relationship well-being.  Sexual activity.  Eating habits.  History of falls.  Memory and ability to understand (cognition).  Work and work Astronomer.  Reproductive health. Screening  You may have the following tests or measurements:  Height, weight, and BMI.  Blood pressure.  Lipid and cholesterol levels. These may be checked every 5 years, or more frequently if you are over 21 years old.  Skin check.  Lung cancer screening. You may have this screening every year starting at age 24 if you have a 30-pack-year history of smoking and currently smoke or have quit within the past 15 years.  Fecal occult blood test (FOBT) of the stool. You may have this test every year starting at age 73.  Flexible sigmoidoscopy or colonoscopy. You may have a sigmoidoscopy every 5 years or a colonoscopy every 10 years starting at age 73.  Hepatitis C blood test.  Hepatitis B blood test.  Sexually transmitted disease (STD) testing.  Diabetes screening. This is done by checking your blood sugar (glucose) after you have not eaten for a while (fasting). You may have this done every 1-3 years.  Bone density scan. This is done to screen for osteoporosis. You may have this done starting at age 30.  Mammogram. This may be done every 1-2 years. Talk to your health care provider about how often you should have regular  mammograms. Talk with your health care provider about your test results, treatment options, and if necessary, the need for more tests. Vaccines  Your health care provider may recommend certain vaccines, such as:  Influenza vaccine. This is recommended every year.  Tetanus, diphtheria, and acellular pertussis (Tdap, Td)  vaccine. You may need a Td booster every 10 years.  Zoster vaccine. You may need this after age 74.  Pneumococcal 13-valent conjugate (PCV13) vaccine. One dose is recommended after age 20.  Pneumococcal polysaccharide (PPSV23) vaccine. One dose is recommended after age 74. Talk to your health care provider about which screenings and vaccines you need and how often you need them. This information is not intended to replace advice given to you by your health care provider. Make sure you discuss any questions you have with your health care provider. Document Released: 08/26/2015 Document Revised: 04/18/2016 Document Reviewed: 05/31/2015 Elsevier Interactive Patient Education  2017 [place] Prevention in the Home Falls can cause injuries. They can happen to people of all ages. There are many things you can do to make your home safe and to help prevent falls. What can I do on the outside of my home?  Regularly fix the edges of walkways and driveways and fix any cracks.  Remove anything that might make you trip as you walk through a door, such as a raised step or threshold.  Trim any bushes or trees on the path to your home.  Use bright outdoor lighting.  Clear any walking paths of anything that might make someone trip, such as rocks or tools.  Regularly check to see if handrails are loose or broken. Make sure that both sides of any steps have handrails.  Any raised decks and porches should have guardrails on the edges.  Have any leaves, snow, or ice cleared regularly.  Use sand or salt on walking paths during winter.  Clean up any spills in your garage right away. This includes oil or grease spills. What can I do in the bathroom?  Use night lights.  Install grab bars by the toilet and in the tub and shower. Do not use towel bars as grab bars.  Use non-skid mats or decals in the tub or shower.  If you need to sit down in the shower, use a plastic, non-slip  stool.  Keep the floor dry. Clean up any water that spills on the floor as soon as it happens.  Remove soap buildup in the tub or shower regularly.  Attach bath mats securely with double-sided non-slip rug tape.  Do not have throw rugs and other things on the floor that can make you trip. What can I do in the bedroom?  Use night lights.  Make sure that you have a light by your bed that is easy to reach.  Do not use any sheets or blankets that are too big for your bed. They should not hang down onto the floor.  Have a firm chair that has side arms. You can use this for support while you get dressed.  Do not have throw rugs and other things on the floor that can make you trip. What can I do in the kitchen?  Clean up any spills right away.  Avoid walking on wet floors.  Keep items that you use a lot in easy-to-reach places.  If you need to reach something above you, use a strong step stool that has a grab bar.  Keep electrical cords out of the way.  Do not use floor polish or wax that makes floors slippery. If you must use wax, use non-skid floor wax.  Do not have throw rugs and other things on the floor that can make you trip. What can I do with my stairs?  Do not leave any items on the stairs.  Make sure that there are handrails on both sides of the stairs and use them. Fix handrails that are broken or loose. Make sure that handrails are as long as the stairways.  Check any carpeting to make sure that it is firmly attached to the stairs. Fix any carpet that is loose or worn.  Avoid having throw rugs at the top or bottom of the stairs. If you do have throw rugs, attach them to the floor with carpet tape.  Make sure that you have a light switch at the top of the stairs and the bottom of the stairs. If you do not have them, ask someone to add them for you. What else can I do to help prevent falls?  Wear shoes that:  Do not have high heels.  Have rubber bottoms.  Are  comfortable and fit you well.  Are closed at the toe. Do not wear sandals.  If you use a stepladder:  Make sure that it is fully opened. Do not climb a closed stepladder.  Make sure that both sides of the stepladder are locked into place.  Ask someone to hold it for you, if possible.  Clearly mark and make sure that you can see:  Any grab bars or handrails.  First and last steps.  Where the edge of each step is.  Use tools that help you move around (mobility aids) if they are needed. These include:  Canes.  Walkers.  Scooters.  Crutches.  Turn on the lights when you go into a dark area. Replace any light bulbs as soon as they burn out.  Set up your furniture so you have a clear path. Avoid moving your furniture around.  If any of your floors are uneven, fix them.  If there are any pets around you, be aware of where they are.  Review your medicines with your doctor. Some medicines can make you feel dizzy. This can increase your chance of falling. Ask your doctor what other things that you can do to help prevent falls. This information is not intended to replace advice given to you by your health care provider. Make sure you discuss any questions you have with your health care provider. Document Released: 05/26/2009 Document Revised: 01/05/2016 Document Reviewed: 09/03/2014 Elsevier Interactive Patient Education  2017 [employer].

## 2018-04-21 NOTE — Progress Notes (Signed)
Subjective:   Tara Potter is a 72 y.o. female who presents for Medicare Annual (Subsequent) preventive examination.  Review of Systems:  N/A  Cardiac Risk Factors include: advanced age (>38men, >55 women);hypertension;dyslipidemia;obesity (BMI >30kg/m2)     Objective:     Vitals: BP (!) 144/66 (BP Location: Right Arm)   Pulse 65   Temp 97.7 F (36.5 C) (Oral)   Ht 5\' 10"  (1.778 m)   Wt (!) 321 lb 3.2 oz (145.7 kg)   BMI 46.09 kg/m   Body mass index is 46.09 kg/m.  Advanced Directives 04/21/2018 04/11/2017 03/12/2016  Does Patient Have a Medical Advance Directive? No No No  Would patient like information on creating a medical advance directive? No - Patient declined Yes (ED - Information included in AVS) -    Tobacco Social History   Tobacco Use  Smoking Status Never Smoker  Smokeless Tobacco Never Used     Counseling given: Not Answered   Clinical Intake:  Pre-visit preparation completed: Yes  Pain : No/denies pain Pain Score: 0-No pain     Nutritional Status: BMI > 30  Obese Nutritional Risks: None Diabetes: No  How often do you need to have someone help you when you read instructions, pamphlets, or other written materials from your doctor or pharmacy?: 1 - Never  Interpreter Needed?: No  Information entered by :: [facility], LPN  Past Medical History:  Diagnosis Date  . Arthritis   . Hyperlipidemia   . Hypertension    Past Surgical History:  Procedure Laterality Date  . DILATION AND CURETTAGE OF UTERUS    . TUBAL LIGATION     Family History  Problem Relation Age of Onset  . Hypertension Mother   . Macular degeneration Mother   . Atrial fibrillation Mother   . Kidney disease Mother   . Heart disease Father   . Arthritis Sister   . Breast cancer Neg Hx    Social History   Socioeconomic History  . Marital status: Married    Spouse name: Not on file  . Number of children: 2  . Years of education: Not on file  . Highest education level:  Bachelor's degree (e.g., BA, AB, BS)  Occupational History  . Occupation: retired  Engineer, production  . Financial resource strain: Not hard at all  . Food insecurity:    Worry: Never true    Inability: Never true  . Transportation needs:    Medical: No    Non-medical: No  Tobacco Use  . Smoking status: Never Smoker  . Smokeless tobacco: Never Used  Substance and Sexual Activity  . Alcohol use: Not Currently  . Drug use: No  . Sexual activity: Not on file  Lifestyle  . Physical activity:    Days per week: Not on file    Minutes per session: Not on file  . Stress: Only a little  Relationships  . Social connections:    Talks on phone: Not on file    Gets together: Not on file    Attends religious service: Not on file    Active member of club or organization: Not on file    Attends meetings of clubs or organizations: Not on file    Relationship status: Not on file  Other Topics Concern  . Not on file  Social History Narrative  . Not on file    Outpatient Encounter Medications as of 04/21/2018  Medication Sig  . aspirin EC 81 MG tablet Take 81 mg  by mouth daily.   . bisoprolol-hydrochlorothiazide (ZIAC) 5-6.25 MG tablet TAKE 1 TABLET BY MOUTH ONCE DAILY  . diclofenac (VOLTAREN) 75 MG EC tablet TAKE 1 TABLET BY MOUTH TWICE DAILY  . fluticasone (FLONASE) 50 MCG/ACT nasal spray Place 2 sprays into both nostrils daily.  Marland Kitchen levothyroxine (SYNTHROID, LEVOTHROID) 75 MCG tablet Take 1 tablet (75 mcg total) by mouth daily.  Marland Kitchen lovastatin (MEVACOR) 20 MG tablet Take 1 tablet (20 mg total) by mouth at bedtime.  . Multiple Vitamins-Minerals (MULTIVITAL PO) Take by mouth daily.   Facility-Administered Encounter Medications as of 04/21/2018  Medication  . methylPREDNISolone acetate (DEPO-MEDROL) injection 80 mg  . methylPREDNISolone acetate (DEPO-MEDROL) injection 80 mg    Activities of Daily Living In your present state of health, do you have any difficulty performing the following  activities: 04/21/2018  Hearing? Y  Comment Does not wear hearing aids.   Vision? N  Difficulty concentrating or making decisions? N  Walking or climbing stairs? Y  Comment Due to back or knee pain.   Dressing or bathing? N  Doing errands, shopping? N  Preparing Food and eating ? N  Using the Toilet? N  In the past six months, have you accidently leaked urine? Y  Comment Occasionally with pressure.   Do you have problems with loss of bowel control? N  Managing your Medications? N  Managing your Finances? N  Housekeeping or managing your Housekeeping? N  Some recent data might be hidden    Patient Care Team: Erasmo Downer, MD as PCP - General (Family Medicine) Dingeldein, Viviann Spare, MD as Consulting Physician (Ophthalmology)    Assessment:   This is a routine wellness examination for Tara Potter.  Exercise Activities and Dietary recommendations Current Exercise Habits: The patient does not participate in regular exercise at present, Exercise limited by: orthopedic condition(s)  Goals    . Reduce sugar in daily diet     Recommend cutting out all extra sugars in daily diet, no more sweets.        Fall Risk Fall Risk  04/21/2018 04/11/2017 03/12/2016  Falls in the past year? Yes No No  Number falls in past yr: 1 - -  Injury with Fall? No - -  Follow up Falls prevention discussed - -   Is the patient's home free of loose throw rugs in walkways, pet beds, electrical cords, etc?   yes      Grab bars in the bathroom? yes      Handrails on the stairs?   yes      Adequate lighting?   yes  Timed Get Up and Go performed: N/A  Depression Screen PHQ 2/9 Scores 04/21/2018 04/21/2018 04/11/2017 04/11/2017  PHQ - 2 Score 1 1 1 1   PHQ- 9 Score 3 - 3 -     Cognitive Function     6CIT Screen 04/21/2018  What Year? 0 points  What month? 0 points  What time? 0 points  Count back from 20 0 points  Months in reverse 0 points  Repeat phrase 0 points  Total Score 0    Immunization  History  Administered Date(s) Administered  . Influenza, High Dose Seasonal PF 06/22/2015, 06/07/2016, 04/11/2017  . Pneumococcal Conjugate-13 04/11/2017  . Pneumococcal Polysaccharide-23 05/18/2013  . Td 12/29/2004    Qualifies for Shingles Vaccine? Due for Shingles vaccine. Declined my offer to administer today. Education has been provided regarding the importance of this vaccine. Pt has been advised to call her insurance company to determine  her out of pocket expense. Advised she may also receive this vaccine at her local pharmacy or Health Dept. Verbalized acceptance and understanding.  Screening Tests Health Maintenance  Topic Date Due  . COLONOSCOPY  01/12/1995  . TETANUS/TDAP  12/30/2014  . INFLUENZA VACCINE  03/13/2018  . DEXA SCAN  08/13/2026 (Originally 01/11/2010)  . MAMMOGRAM  07/10/2019  . Hepatitis C Screening  Completed  . PNA vac Low Risk Adult  Completed    Cancer Screenings: Lung: Low Dose CT Chest recommended if Age 54-80 years, 30 pack-year currently smoking OR have quit w/in 15years. Patient does not qualify. Breast:  Up to date on Mammogram? Yes   Up to date of Bone Density/Dexa? Due, pt declines referral today.  Colorectal: Due, pt declined referral today.   Additional Screenings:  Hepatitis C Screening: Up to date     Plan:  I have personally reviewed and addressed the Medicare Annual Wellness questionnaire and have noted the following in the patient's chart:  A. Medical and social history B. Use of alcohol, tobacco or illicit drugs  C. Current medications and supplements D. Functional ability and status E.  Nutritional status F.  Physical activity G. Advance directives H. List of other physicians I.  Hospitalizations, surgeries, and ER visits in previous 12 months J.  Vitals K. Screenings such as hearing and vision if needed, cognitive and depression L. Referrals and appointments - none  In addition, I have reviewed and discussed with patient  certain preventive protocols, quality metrics, and best practice recommendations. A written personalized care plan for preventive services as well as general preventive health recommendations were provided to patient.  See attached scanned questionnaire for additional information.   Signed,  Hyacinth Meeker, LPN Nurse Health Advisor   Nurse Recommendations: Pt plans to get her flu shot at her CPE on 04/28/18 @ 2 PM.

## 2018-04-23 ENCOUNTER — Ambulatory Visit: Payer: Medicare Other | Admitting: Family Medicine

## 2018-04-28 ENCOUNTER — Ambulatory Visit (INDEPENDENT_AMBULATORY_CARE_PROVIDER_SITE_OTHER): Payer: Medicare Other | Admitting: Family Medicine

## 2018-04-28 ENCOUNTER — Encounter: Payer: Self-pay | Admitting: Family Medicine

## 2018-04-28 VITALS — BP 142/80 | HR 63 | Temp 97.4°F | Ht 70.0 in | Wt 319.0 lb

## 2018-04-28 DIAGNOSIS — E7800 Pure hypercholesterolemia, unspecified: Secondary | ICD-10-CM

## 2018-04-28 DIAGNOSIS — R7303 Prediabetes: Secondary | ICD-10-CM

## 2018-04-28 DIAGNOSIS — Z23 Encounter for immunization: Secondary | ICD-10-CM | POA: Diagnosis not present

## 2018-04-28 DIAGNOSIS — B372 Candidiasis of skin and nail: Secondary | ICD-10-CM

## 2018-04-28 DIAGNOSIS — E039 Hypothyroidism, unspecified: Secondary | ICD-10-CM

## 2018-04-28 DIAGNOSIS — Z0000 Encounter for general adult medical examination without abnormal findings: Secondary | ICD-10-CM

## 2018-04-28 DIAGNOSIS — I10 Essential (primary) hypertension: Secondary | ICD-10-CM | POA: Diagnosis not present

## 2018-04-28 DIAGNOSIS — Z1239 Encounter for other screening for malignant neoplasm of breast: Secondary | ICD-10-CM

## 2018-04-28 DIAGNOSIS — M170 Bilateral primary osteoarthritis of knee: Secondary | ICD-10-CM

## 2018-04-28 DIAGNOSIS — Z1231 Encounter for screening mammogram for malignant neoplasm of breast: Secondary | ICD-10-CM

## 2018-04-28 MED ORDER — LIDOCAINE HCL (PF) 1 % IJ SOLN
4.0000 mL | Freq: Once | INTRAMUSCULAR | Status: AC
  Administered 2018-04-28: 4 mL via INTRADERMAL

## 2018-04-28 MED ORDER — NYSTATIN 100000 UNIT/GM EX POWD: Freq: Four times a day (QID) | CUTANEOUS | 5 refills | Status: DC

## 2018-04-28 MED ORDER — METHYLPREDNISOLONE ACETATE 40 MG/ML IJ SUSP
40.0000 mg | Freq: Once | INTRAMUSCULAR | Status: AC
  Administered 2018-04-28: 40 mg via INTRAMUSCULAR

## 2018-04-28 NOTE — Assessment & Plan Note (Signed)
Congratulated patient on her 11 pound weight loss in the last year Discussed importance of healthy weight Discussed diet and exercise

## 2018-04-28 NOTE — Assessment & Plan Note (Signed)
Stable Recheck TSH Continue Synthroid at current dose

## 2018-04-28 NOTE — Assessment & Plan Note (Signed)
Slightly above goal today, which may be related to the patient's pain Has previously been quite well controlled No changes to current medications Recheck metabolic panel Follow-up in 6 months

## 2018-04-28 NOTE — Progress Notes (Signed)
Patient: Tara Potter, Female    DOB: 06/26/1946, 72 y.o.   MRN: [id number] Visit Date: 04/28/2018  Today's Provider: Shirlee Latch, MD   Chief Complaint  Patient presents with  . Annual Exam   Subjective:  I, Tara Potter, CMA, am acting as a scribe for Shirlee Latch, MD.   Patient had a AWE on 04/21/2018 with [facility]    Complete Physical Tara Potter is a 72 y.o. female. She feels well. She reports exercising none. She reports she is sleeping poorly due to mattress.  Last mammogram 07/09/2017 - BIRADS1 Has never had a DEXA due to exceeding the weight limit on the table Patient states that she does not want to do any colon cancer screening -----------------------------------------------------------  Hypertension, follow-up:  She was last seen for hypertension 3 months ago.  BP at that visit was 132/80. Management since that visit includes no change. She reports excellent compliance with treatment. She is not having side effects.  She is not adherent to low salt diet.   Outside blood pressures are seldom checked. She is experiencing dyspnea and lower extremity edema. These are chronic and unchanged from baseline  Patient denies chest pain, chest pressure/discomfort, claudication, exertional chest pressure/discomfort, fatigue, irregular heart beat, near-syncope, orthopnea, palpitations and syncope.   Cardiovascular risk factors include advanced age (older than 86 for men, 32 for women), dyslipidemia, hypertension and obesity (BMI >= 30 kg/m2).  Current diet: in general, a "healthy" diet    HLD - medications: lovastatin 20 mg daily - compliance: good - medication SEs: none - also takes daily ASA ------------------------------------------------------------------------ Bilateral knee OA - got corticosteroid injections in bilateral knees 3 months ago.  States that knees have been painful for last 1 week.  No swelling or redness.  Gets great pain relief with  injections.  These usually last 3 months.  She states that the diclofenac helps some, but not entirely.  She states she is taking Tylenol arthritis twice daily that helps her get by.     Hypothyroidism: She was last seen for hypothyroid 6 months ago.  Management since that visit includes checking labs and continuing medications. She reports excellent compliance with treatment. She is not having side effects.  She is not exercising. She is experiencing  heat / cold intolerance. She denies change in energy level, nervousness, palpitations and weight changes    Review of Systems  Constitutional: Negative.   HENT: Negative.   Eyes: Negative.  Eye discharge: eye watering.  Respiratory: Negative.   Cardiovascular: Negative.   Gastrointestinal: Negative.   Endocrine: Negative.   Genitourinary: Negative.   Musculoskeletal: Positive for arthralgias and back pain.  Skin: Positive for rash.  Allergic/Immunologic: Negative.   Neurological: Negative.   Hematological: Negative.   Psychiatric/Behavioral: Negative.     Social History   Socioeconomic History  . Marital status: Married    Spouse name: Not on file  . Number of children: 2  . Years of education: Not on file  . Highest education level: Bachelor's degree (e.g., BA, AB, BS)  Occupational History  . Occupation: retired  Engineer, production  . Financial resource strain: Not hard at all  . Food insecurity:    Worry: Never true    Inability: Never true  . Transportation needs:    Medical: No    Non-medical: No  Tobacco Use  . Smoking status: Never Smoker  . Smokeless tobacco: Never Used  Substance and Sexual Activity  . Alcohol use: Not Currently  .  Drug use: No  . Sexual activity: Not on file  Lifestyle  . Physical activity:    Days per week: Not on file    Minutes per session: Not on file  . Stress: Only a little  Relationships  . Social connections:    Talks on phone: Not on file    Gets together: Not on file     Attends religious service: Not on file    Active member of club or organization: Not on file    Attends meetings of clubs or organizations: Not on file    Relationship status: Not on file  . Intimate partner violence:    Fear of current or ex partner: Not on file    Emotionally abused: Not on file    Physically abused: Not on file    Forced sexual activity: Not on file  Other Topics Concern  . Not on file  Social History Narrative  . Not on file    Past Medical History:  Diagnosis Date  . Arthritis   . Hyperlipidemia   . Hypertension      Patient Active Problem List   Diagnosis Date Noted  . Dysfunction of right eustachian tube 10/14/2017  . DJD (degenerative joint disease), lumbar 07/15/2017  . Bursitis of right shoulder 04/11/2017  . Morbid obesity (HCC) 04/11/2017  . Healthcare maintenance 04/11/2017  . Prediabetes 03/14/2016  . Arthritis, degenerative 09/29/2008  . Essential (primary) hypertension 02/25/2008  . Cardiac murmur 01/29/2008  . HLD (hyperlipidemia) 08/13/1998  . Adult hypothyroidism 08/13/1998  . Hay fever 08/13/1998    Past Surgical History:  Procedure Laterality Date  . DILATION AND CURETTAGE OF UTERUS    . TUBAL LIGATION      Her family history includes Arthritis in her sister; Atrial fibrillation in her mother; Heart disease in her father; Hypertension in her mother; Kidney disease in her mother; Macular degeneration in her mother. There is no history of Breast cancer.      Current Outpatient Medications:  .  aspirin EC 81 MG tablet, Take 81 mg by mouth daily. , Disp: , Rfl:  .  bisoprolol-hydrochlorothiazide (ZIAC) 5-6.25 MG tablet, TAKE 1 TABLET BY MOUTH ONCE DAILY, Disp: 90 tablet, Rfl: 3 .  diclofenac (VOLTAREN) 75 MG EC tablet, TAKE 1 TABLET BY MOUTH TWICE DAILY, Disp: 60 tablet, Rfl: 2 .  fluticasone (FLONASE) 50 MCG/ACT nasal spray, Place 2 sprays into both nostrils daily., Disp: 16 g, Rfl: 11 .  levothyroxine (SYNTHROID, LEVOTHROID) 75  MCG tablet, Take 1 tablet (75 mcg total) by mouth daily., Disp: 90 tablet, Rfl: 1 .  lovastatin (MEVACOR) 20 MG tablet, Take 1 tablet (20 mg total) by mouth at bedtime., Disp: 90 tablet, Rfl: 1 .  Multiple Vitamins-Minerals (MULTIVITAL PO), Take by mouth daily., Disp: , Rfl:  .  nystatin (MYCOSTATIN/NYSTOP) powder, Apply topically 4 (four) times daily. To rash, Disp: 60 g, Rfl: 5  Patient Care Team: Erasmo Downer, MD as PCP - General (Family Medicine) Dingeldein, Viviann Spare, MD as Consulting Physician (Ophthalmology)     Objective:   Vitals: BP (!) 142/80 (BP Location: Right Arm, Patient Position: Sitting, Cuff Size: Large)   Pulse 63   Temp (!) 97.4 F (36.3 C) (Oral)   Ht 5\' 10"  (1.778 m)   Wt (!) 319 lb (144.7 kg)   SpO2 96%   BMI 45.77 kg/m   Physical Exam  Constitutional: She is oriented to person, place, and time. She appears well-developed and well-nourished. No distress.  HENT:  Head: Normocephalic and atraumatic.  Right Ear: Tympanic membrane, external ear and ear canal normal.  Left Ear: Tympanic membrane, external ear and ear canal normal.  Nose: Nose normal.  Mouth/Throat: Oropharynx is clear and moist.  Eyes: Pupils are equal, round, and reactive to light. Conjunctivae and EOM are normal. No scleral icterus.  Neck: Neck supple. No thyromegaly present.  Cardiovascular: Normal rate, regular rhythm, normal heart sounds and intact distal pulses.  No murmur heard. Pulmonary/Chest: Effort normal and breath sounds normal. No respiratory distress. She has no wheezes. She has no rales.  Abdominal: Soft. She exhibits no distension. There is no tenderness.  Musculoskeletal: She exhibits edema (trace, non-pitting b/l).  B/l knees: some swelling, no effusions discernible on exam.  No tenderness to palpation along the patella, joint lines, patellar or quadriceps tendons.  Strength intact, though painful to push against resistance.   Lymphadenopathy:    She has no cervical  adenopathy.  Neurological: She is alert and oriented to person, place, and time.  Skin: Skin is warm and dry. Capillary refill takes less than 2 seconds. Rash (intertrigo under b/l breasts) noted.  Psychiatric: She has a normal mood and affect. Her behavior is normal.  Vitals reviewed.   Activities of Daily Living In your present state of health, do you have any difficulty performing the following activities: 04/21/2018  Hearing? Y  Comment Does not wear hearing aids.   Vision? N  Difficulty concentrating or making decisions? N  Walking or climbing stairs? Y  Comment Due to back or knee pain.   Dressing or bathing? N  Doing errands, shopping? N  Preparing Food and eating ? N  Using the Toilet? N  In the past six months, have you accidently leaked urine? Y  Comment Occasionally with pressure.   Do you have problems with loss of bowel control? N  Managing your Medications? N  Managing your Finances? N  Housekeeping or managing your Housekeeping? N  Some recent data might be hidden    Fall Risk Assessment Fall Risk  04/21/2018 04/11/2017 03/12/2016  Falls in the past year? Yes No No  Number falls in past yr: 1 - -  Injury with Fall? No - -  Follow up Falls prevention discussed - -     Depression Screen PHQ 2/9 Scores 04/21/2018 04/21/2018 04/11/2017 04/11/2017  PHQ - 2 Score 1 1 1 1   PHQ- 9 Score 3 - 3 -     Assessment & Plan:    Annual Physical Reviewed patient's Family Medical History Reviewed and updated list of patient's medical providers Assessment of cognitive impairment was done Assessed patient's functional ability Established a written schedule for health screening services Health Risk Assessent Completed and Reviewed  Exercise Activities and Dietary recommendations Goals    . Reduce sugar in daily diet     Recommend cutting out all extra sugars in daily diet, no more sweets.        Immunization History  Administered Date(s) Administered  . Influenza, High  Dose Seasonal PF 06/22/2015, 06/07/2016, 04/11/2017, 04/28/2018  . Pneumococcal Conjugate-13 04/11/2017  . Pneumococcal Polysaccharide-23 05/18/2013  . Td 12/29/2004    Health Maintenance  Topic Date Due  . COLONOSCOPY  01/12/1995  . TETANUS/TDAP  04/29/2019 (Originally 12/30/2014)  . DEXA SCAN  08/13/2026 (Originally 01/11/2010)  . MAMMOGRAM  07/10/2019  . INFLUENZA VACCINE  Completed  . Hepatitis C Screening  Completed  . PNA vac Low Risk Adult  Completed     Discussed health benefits  of physical activity, and encouraged her to engage in regular exercise appropriate for her age and condition.    ------------------------------------------------------------------------------------------------------------  Problem List Items Addressed This Visit      Cardiovascular and Mediastinum   Essential (primary) hypertension    Slightly above goal today, which may be related to the patient's pain Has previously been quite well controlled No changes to current medications Recheck metabolic panel Follow-up in 6 months      Relevant Orders   Comprehensive metabolic panel     Endocrine   Adult hypothyroidism    Stable Recheck TSH Continue Synthroid at current dose      Relevant Orders   TSH     Musculoskeletal and Integument   Arthritis, degenerative    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 Repeat steroid injections in bilateral knees again today Discussed that we can continue these no more than every 3 months She is not currently candidate for total knee replacement given her morbid obesity Advised on weight loss, diet, and exercise Can use Voltaren as needed, but discussed the long-term risks of chronic NSAIDs She can also continue scheduled Tylenol to help with arthritis pain Follow-up in 3 to 6 months        Other   HLD (hyperlipidemia)    Stable Recheck fasting lipid panel and CMP Continue lovastatin at current dose Pending lipid panel,  consider higher intensity statin if needed depending on cholesterol levels      Relevant Orders   Comprehensive metabolic panel   Lipid panel   Prediabetes    Recheck A1c Discussed diet and exercise      Relevant Orders   Hemoglobin A1c   Morbid obesity (HCC)    Congratulated patient on her 11 pound weight loss in the last year Discussed importance of healthy weight Discussed diet and exercise      Relevant Orders   Comprehensive metabolic panel   CBC w/Diff/Platelet   Healthcare maintenance    Mammogram ordered Influenza shot given today Patient refuses colon cancer screening and DEXA at this time Repeat CPE in 1 year       Other Visit Diagnoses    Encounter for annual physical exam    -  Primary   Need for influenza vaccination       Relevant Orders   Flu vaccine HIGH DOSE PF (Completed)   Candidal intertrigo       Relevant Medications   nystatin (MYCOSTATIN/NYSTOP) powder   Screening for breast cancer       Relevant Orders   MM 3D SCREEN BREAST BILATERAL       INJECTION: Patient was given informed consent. Appropriate time out was taken. Area prepped and draped in usual sterile fashion. 1 cc of depomedrol 40mg /ml plus  4 cc of 1% lidocaine without epinephrine was injected into the R knee using a(n) anterolateral approach and into the L knee using an anteromedial approach. The patient tolerated the procedure well. There were no complications. Post procedure instructions were given.   Return in about 3 months (around 07/28/2018) for knee arthritis.   The entirety of the information documented in the History of Present Illness, Review of Systems and Physical Exam were personally obtained by me. Portions of this information were initially documented by Tara RaddleNikki Walston, CMA and reviewed by me for thoroughness and accuracy.    Erasmo DownerBacigalupo, Jacob Cicero M, MD, MPH [facility] 04/28/2018 2:42 PM

## 2018-04-28 NOTE — Assessment & Plan Note (Signed)
Mammogram ordered Influenza shot given today Patient refuses colon cancer screening and DEXA at this time Repeat CPE in 1 year

## 2018-04-28 NOTE — Assessment & Plan Note (Signed)
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 Repeat steroid injections in bilateral knees again today Discussed that we can continue these no more than every 3 months She is not currently candidate for total knee replacement given her morbid obesity Advised on weight loss, diet, and exercise Can use Voltaren as needed, but discussed the long-term risks of chronic NSAIDs She can also continue scheduled Tylenol to help with arthritis pain Follow-up in 3 to 6 months

## 2018-04-28 NOTE — Assessment & Plan Note (Signed)
Recheck A1c Discussed diet and exercise 

## 2018-04-28 NOTE — Addendum Note (Signed)
Addended by: Sherre PootWALSTON, Tyjuan Demetro N on: 04/28/2018 03:09 PM   Modules accepted: Orders

## 2018-04-28 NOTE — Assessment & Plan Note (Signed)
Stable Recheck fasting lipid panel and CMP Continue lovastatin at current dose Pending lipid panel, consider higher intensity statin if needed depending on cholesterol levels

## 2018-04-28 NOTE — Patient Instructions (Signed)
Preventive Care 73 Years and Older, Female Preventive care refers to lifestyle choices and visits with your health care provider that can promote health and wellness. What does preventive care include?  A yearly physical exam. This is also called an annual well check.  Dental exams once or twice a year.  Routine eye exams. Ask your health care provider how often you should have your eyes checked.  Personal lifestyle choices, including: ? Daily care of your teeth and gums. ? Regular physical activity. ? Eating a healthy diet. ? Avoiding tobacco and drug use. ? Limiting alcohol use. ? Practicing safe sex. ? Taking low-dose aspirin every day. ? Taking vitamin and mineral supplements as recommended by your health care provider. What happens during an annual well check? The services and screenings done by your health care provider during your annual well check will depend on your age, overall health, lifestyle risk factors, and family history of disease. Counseling Your health care provider may ask you questions about your:  Alcohol use.  Tobacco use.  Drug use.  Emotional well-being.  Home and relationship well-being.  Sexual activity.  Eating habits.  History of falls.  Memory and ability to understand (cognition).  Work and work environment.  Reproductive health.  Screening You may have the following tests or measurements:  Height, weight, and BMI.  Blood pressure.  Lipid and cholesterol levels. These may be checked every 5 years, or more frequently if you are over 50 years old.  Skin check.  Lung cancer screening. You may have this screening every year starting at age 55 if you have a 30-pack-year history of smoking and currently smoke or have quit within the past 15 years.  Fecal occult blood test (FOBT) of the stool. You may have this test every year starting at age 50.  Flexible sigmoidoscopy or colonoscopy. You may have a sigmoidoscopy every 5 years or  a colonoscopy every 10 years starting at age 50.  Hepatitis C blood test.  Hepatitis B blood test.  Sexually transmitted disease (STD) testing.  Diabetes screening. This is done by checking your blood sugar (glucose) after you have not eaten for a while (fasting). You may have this done every 1-3 years.  Bone density scan. This is done to screen for osteoporosis. You may have this done starting at age 73.  Mammogram. This may be done every 1-2 years. Talk to your health care provider about how often you should have regular mammograms.  Talk with your health care provider about your test results, treatment options, and if necessary, the need for more tests. Vaccines Your health care provider may recommend certain vaccines, such as:  Influenza vaccine. This is recommended every year.  Tetanus, diphtheria, and acellular pertussis (Tdap, Td) vaccine. You may need a Td booster every 10 years.  Varicella vaccine. You may need this if you have not been vaccinated.  Zoster vaccine. You may need this after age 60.  Measles, mumps, and rubella (MMR) vaccine. You may need at least one dose of MMR if you were born in 1957 or later. You may also need a second dose.  Pneumococcal 13-valent conjugate (PCV13) vaccine. One dose is recommended after age 73.  Pneumococcal polysaccharide (PPSV23) vaccine. One dose is recommended after age 73.  Meningococcal vaccine. You may need this if you have certain conditions.  Hepatitis A vaccine. You may need this if you have certain conditions or if you travel or work in places where you may be exposed to hepatitis   A.  Hepatitis B vaccine. You may need this if you have certain conditions or if you travel or work in places where you may be exposed to hepatitis B.  Haemophilus influenzae type b (Hib) vaccine. You may need this if you have certain conditions.  Talk to your health care provider about which screenings and vaccines you need and how often you  need them. This information is not intended to replace advice given to you by your health care provider. Make sure you discuss any questions you have with your health care provider. Document Released: 08/26/2015 Document Revised: 04/18/2016 Document Reviewed: 05/31/2015 Elsevier Interactive Patient Education  2018 [employer]  

## 2018-05-01 LAB — CBC WITH DIFFERENTIAL/PLATELET
BASOS ABS: 0.1 10*3/uL (ref 0.0–0.2)
Basos: 1 %
EOS (ABSOLUTE): 0.2 10*3/uL (ref 0.0–0.4)
EOS: 2 %
HEMATOCRIT: 41.9 % (ref 34.0–46.6)
Hemoglobin: 14.4 g/dL (ref 11.1–15.9)
IMMATURE GRANULOCYTES: 0 %
Immature Grans (Abs): 0 10*3/uL (ref 0.0–0.1)
LYMPHS ABS: 3.7 10*3/uL — AB (ref 0.7–3.1)
Lymphs: 37 %
MCH: 30.2 pg (ref 26.6–33.0)
MCHC: 34.4 g/dL (ref 31.5–35.7)
MCV: 88 fL (ref 79–97)
Monocytes Absolute: 0.7 10*3/uL (ref 0.1–0.9)
Monocytes: 7 %
NEUTROS PCT: 53 %
Neutrophils Absolute: 5.5 10*3/uL (ref 1.4–7.0)
PLATELETS: 312 10*3/uL (ref 150–450)
RBC: 4.77 x10E6/uL (ref 3.77–5.28)
RDW: 14.1 % (ref 12.3–15.4)
WBC: 10.2 10*3/uL (ref 3.4–10.8)

## 2018-05-01 LAB — COMPREHENSIVE METABOLIC PANEL
ALBUMIN: 4.2 g/dL (ref 3.5–4.8)
ALT: 17 IU/L (ref 0–32)
AST: 14 IU/L (ref 0–40)
Albumin/Globulin Ratio: 1.8 (ref 1.2–2.2)
Alkaline Phosphatase: 116 IU/L (ref 39–117)
BILIRUBIN TOTAL: 0.3 mg/dL (ref 0.0–1.2)
BUN / CREAT RATIO: 23 (ref 12–28)
BUN: 20 mg/dL (ref 8–27)
CALCIUM: 10.1 mg/dL (ref 8.7–10.3)
CHLORIDE: 100 mmol/L (ref 96–106)
CO2: 25 mmol/L (ref 20–29)
CREATININE: 0.87 mg/dL (ref 0.57–1.00)
GFR calc Af Amer: 76 mL/min/{1.73_m2} (ref >59)
GFR, EST NON AFRICAN AMERICAN: 66 mL/min/{1.73_m2} (ref >59)
GLUCOSE: 93 mg/dL (ref 65–99)
Globulin, Total: 2.3 g/dL (ref 1.5–4.5)
Potassium: 4.5 mmol/L (ref 3.5–5.2)
Sodium: 141 mmol/L (ref 134–144)
TOTAL PROTEIN: 6.5 g/dL (ref 6.0–8.5)

## 2018-05-01 LAB — LIPID PANEL
CHOL/HDL RATIO: 3.6 ratio (ref 0.0–4.4)
Cholesterol, Total: 163 mg/dL (ref 100–199)
HDL: 45 mg/dL (ref >39)
LDL CALC: 94 mg/dL (ref 0–99)
Triglycerides: 121 mg/dL (ref 0–149)
VLDL Cholesterol Cal: 24 mg/dL (ref 5–40)

## 2018-05-01 LAB — HEMOGLOBIN A1C
ESTIMATED AVERAGE GLUCOSE: 120 mg/dL
Hgb A1c MFr Bld: 5.8 % — ABNORMAL HIGH (ref 4.8–5.6)

## 2018-05-01 LAB — TSH: TSH: 3.47 u[IU]/mL (ref 0.450–4.500)

## 2018-06-05 LAB — FECAL OCCULT BLOOD, IMMUNOCHEMICAL: IFOBT: NEGATIVE

## 2018-06-13 ENCOUNTER — Other Ambulatory Visit: Payer: Self-pay | Admitting: Family Medicine

## 2018-06-23 ENCOUNTER — Encounter: Payer: Self-pay | Admitting: Family Medicine

## 2018-07-28 ENCOUNTER — Encounter: Payer: Self-pay | Admitting: Family Medicine

## 2018-07-28 ENCOUNTER — Ambulatory Visit: Payer: Medicare Other | Admitting: Family Medicine

## 2018-07-28 VITALS — BP 123/82 | HR 67 | Temp 97.4°F | Wt 318.8 lb

## 2018-07-28 DIAGNOSIS — Z7409 Other reduced mobility: Secondary | ICD-10-CM | POA: Diagnosis not present

## 2018-07-28 DIAGNOSIS — E785 Hyperlipidemia, unspecified: Secondary | ICD-10-CM

## 2018-07-28 DIAGNOSIS — M170 Bilateral primary osteoarthritis of knee: Secondary | ICD-10-CM

## 2018-07-28 MED ORDER — METHYLPREDNISOLONE ACETATE 40 MG/ML IJ SUSP
40.0000 mg | Freq: Once | INTRAMUSCULAR | Status: AC
  Administered 2018-07-28: 40 mg via INTRAMUSCULAR

## 2018-07-28 MED ORDER — DICLOFENAC SODIUM 1 % TD GEL: 2.0000 g | Freq: Four times a day (QID) | TRANSDERMAL | 3 refills | Status: DC

## 2018-07-28 MED ORDER — LOVASTATIN 20 MG PO TABS: 20.0000 mg | ORAL_TABLET | Freq: Every day | ORAL | 1 refills | Status: DC

## 2018-07-28 MED ORDER — LIDOCAINE-EPINEPHRINE 2 %-1:100000 IJ SOLN
4.0000 mL | Freq: Once | INTRAMUSCULAR | Status: AC
  Administered 2018-07-28: 4 mL via INTRADERMAL

## 2018-07-28 NOTE — Progress Notes (Signed)
Patient: Tara Potter Female    DOB: 03/27/1946   72 y.o.   MRN: [id number] Visit Date: 07/30/2018  Today's Provider: Shirlee LatchAngela , MD   Chief Complaint  Patient presents with  . Knee Pain   Subjective:     HPI Arthritis, degenerative: Patient presents for a 3 month follow up. Patient received bilateral steroid knee injections. Patient advised to work on weight loss, diet, exercise, use Voltaren PRN, continue scheduled Tylenol, and follow up in 3 to 6 months. Patient reports fair compliance with treatment plan. She is requesting steroid injections today.  She feels like steroid injections did not last as long this time - only ~1 month.  She is wondering about doing a [employer]ist[employer] recliner and mobility scooter.  Very difficult to stand from sitting and walk long distances due to knee OA.  Has significant pain.  Allergies  Allergen Reactions  . Amoxicillin Diarrhea    Augmentin  . Entex T [Pseudoephedrine-Guaifenesin]     keeps awake; pt states she still takes this  . Erythromycin Diarrhea     Current Outpatient Medications:  .  aspirin EC 81 MG tablet, Take 81 mg by mouth daily. , Disp: , Rfl:  .  bisoprolol-hydrochlorothiazide (ZIAC) 5-6.25 MG tablet, TAKE 1 TABLET BY MOUTH ONCE DAILY, Disp: 90 tablet, Rfl: 3 .  diclofenac (VOLTAREN) 75 MG EC tablet, TAKE 1 TABLET BY MOUTH TWICE DAILY, Disp: 180 tablet, Rfl: 3 .  fluticasone (FLONASE) 50 MCG/ACT nasal spray, Place 2 sprays into both nostrils daily., Disp: 16 g, Rfl: 11 .  levothyroxine (SYNTHROID, LEVOTHROID) 75 MCG tablet, TAKE 1 TABLET BY MOUTH ONCE DAILY, Disp: 90 tablet, Rfl: 3 .  lovastatin (MEVACOR) 20 MG tablet, Take 1 tablet (20 mg total) by mouth at bedtime., Disp: 90 tablet, Rfl: 1 .  Multiple Vitamins-Minerals (MULTIVITAL PO), Take by mouth daily., Disp: , Rfl:  .  nystatin (MYCOSTATIN/NYSTOP) powder, Apply topically 4 (four) times daily. To rash, Disp: 60 g, Rfl: 5 .  diclofenac sodium (VOLTAREN) 1 % GEL, Apply 2  g topically 4 (four) times daily., Disp: 100 g, Rfl: 3  Review of Systems  Constitutional: Negative.   HENT: Negative.   Respiratory: Negative.   Cardiovascular: Negative.   Musculoskeletal: Positive for arthralgias, gait problem and joint swelling. Negative for back pain, myalgias, neck pain and neck stiffness.  Skin: Negative.   Psychiatric/Behavioral: Negative.     Social History   Tobacco Use  . Smoking status: Never Smoker  . Smokeless tobacco: Never Used  Substance Use Topics  . Alcohol use: Not Currently      Objective:   BP 123/82 (BP Location: Right Arm, Patient Position: Sitting, Cuff Size: Large)   Pulse 67   Temp (!) 97.4 F (36.3 C) (Oral)   Wt (!) 318 lb 12.8 oz (144.6 kg)   SpO2 98%   BMI 45.74 kg/m  Vitals:   07/28/18 1316  BP: 123/82  Pulse: 67  Temp: (!) 97.4 F (36.3 C)  TempSrc: Oral  SpO2: 98%  Weight: (!) 318 lb 12.8 oz (144.6 kg)     Physical Exam Vitals signs reviewed.  Constitutional:      General: She is not in acute distress.    Appearance: Normal appearance. She is not ill-appearing.  HENT:     Head: Normocephalic and atraumatic.     Right Ear: External ear normal.     Left Ear: External ear normal.     Mouth/Throat:  Pharynx: Oropharynx is clear.  Eyes:     General: No scleral icterus.    Conjunctiva/sclera: Conjunctivae normal.  Neck:     Musculoskeletal: Neck supple.  Cardiovascular:     Rate and Rhythm: Normal rate and regular rhythm.     Pulses: Normal pulses.  Pulmonary:     Effort: Pulmonary effort is normal. No respiratory distress.     Breath sounds: Normal breath sounds. No wheezing or rhonchi.  Musculoskeletal:     Comments: B/l knees: some swelling, no effusions discernible on exam.  No tenderness to palpation along the patella, joint lines, patellar or quadriceps tendons.  Strength intact, though painful to push against resistance.    Lymphadenopathy:     Cervical: No cervical adenopathy.  Skin:     General: Skin is warm and dry.     Capillary Refill: Capillary refill takes less than 2 seconds.     Findings: No rash.  Neurological:     Mental Status: She is alert.        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 Repeat steroid injections in bilateral knees again today Discussed that we can continue these no more than every 3 months She is not currently a candidate for total knee replacement given her morbid obesity Advised on weight loss, diet, and exercise Can use Voltaren gel as needed Discussed long-term risks of chronic NSAIDs She can also continue scheduled Tylenol to help with arthritis pain C procedure note below Prescriptions given for mobility scooter and lift recliner Follow-up in 3 to 6 months      Relevant Medications   lidocaine-EPINEPHrine (XYLOCAINE W/EPI) 2 %-1:100000 (with pres) injection 4 mL (Completed)   lidocaine-EPINEPHrine (XYLOCAINE W/EPI) 2 %-1:100000 (with pres) injection 4 mL (Completed)   methylPREDNISolone acetate (DEPO-MEDROL) injection 40 mg (Completed)   methylPREDNISolone acetate (DEPO-MEDROL) injection 40 mg (Completed)     Other   HLD (hyperlipidemia)   Relevant Medications   lovastatin (MEVACOR) 20 MG tablet    Other Visit Diagnoses    Mobility impaired        -See plan above for osteoarthritis -Prescriptions given for mobility scooter and lift recliner    INJECTION: Patient was given informed consent. Appropriate time out was taken. Area prepped and draped in usual sterile fashion. 1 cc of depo-medrol 40 mg/ml plus  4 cc of 2% lidocaine with epinephrine was injected into the R knee using a(n) anterolateral approach and the left knee using an anterior medial approach. The patient tolerated the procedure well. There were no complications. Post procedure instructions were  given.   Return in about 3 months (around 10/27/2018) for chronic disease f/u.   The entirety of the information documented in the History of Present Illness, Review of Systems and Physical Exam were personally obtained by me. Portions of this information were initially documented by [name], CMA and reviewed by me for thoroughness and accuracy.    Erasmo Downer, MD, MPH [facility] 07/30/2018 9:14 AM

## 2018-07-28 NOTE — Patient Instructions (Signed)
Knee Injection, Care After  Refer to this sheet in the next few weeks. These instructions provide you with information about caring for yourself after your procedure. Your health care provider may also give you more specific instructions. Your treatment has been planned according to current medical practices, but problems sometimes occur. Call your health care provider if you have any problems or questions after your procedure.  What can I expect after the procedure?  After the procedure, it is common to have:   Soreness.   Warmth.   Swelling.    You may have more pain, swelling, and warmth than you did before the injection. This reaction may last for about one day.  Follow these instructions at home:  Bathing   If you were given a bandage (dressing), keep it dry until your health care provider says it can be removed. Ask your health care provider when you can start showering or taking a bath.  Managing pain, stiffness, and swelling   If directed, apply ice to the injection area:  ? Put ice in a plastic bag.  ? Place a towel between your skin and the bag.  ? Leave the ice on for 20 minutes, 2-3 times per day.   Do not apply heat to your knee.   Raise the injection area above the level of your heart while you are sitting or lying down.  Activity   Avoid strenuous activities for as long as directed by your health care provider. Ask your health care provider when you can return to your normal activities.  General instructions   Take medicines only as directed by your health care provider.   Do not take aspirin or other over-the-counter medicines unless your health care provider says you can.   Check your injection site every day for signs of infection. Watch for:  ? Redness, swelling, or pain.  ? Fluid, blood, or pus.   Follow your health care provider's instructions about dressing changes and removal.  Contact a health care provider if:   You have symptoms at your injection site that last longer than  two days after your procedure.   You have redness, swelling, or pain in your injection area.   You have fluid, blood, or pus coming from your injection site.   You have warmth in your injection area.   You have a fever.   Your pain is not controlled with medicine.  Get help right away if:   Your knee turns very red.   Your knee becomes very swollen.   Your knee pain is severe.  This information is not intended to replace advice given to you by your health care provider. Make sure you discuss any questions you have with your health care provider.  Document Released: 08/20/2014 Document Revised: 04/04/2016 Document Reviewed: 06/09/2014  Elsevier Interactive Patient Education  2018 [employer]

## 2018-07-30 NOTE — Assessment & Plan Note (Signed)
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

## 2018-08-28 ENCOUNTER — Telehealth: Payer: Self-pay

## 2018-08-28 MED ORDER — PREDNISONE 20 MG PO TABS: ORAL_TABLET | ORAL | 0 refills | Status: DC

## 2018-08-28 NOTE — Telephone Encounter (Signed)
Rx sent 

## 2018-08-28 NOTE — Telephone Encounter (Signed)
Patient calling that her left side Sciatica is hurting since last night and she was not able to sleep. Reports that pain radiates down her leg. She wants to know if there's anything else that she can try OTC to put on it. Reports no injury.She still takes the Diclofenac , she took a warm bath last night and applied freeze roll on.

## 2018-08-28 NOTE — Telephone Encounter (Signed)
She can take tylenol with that, but not ibuprofen or aleve.  We could try a steroid taper to see if this will help symptoms if she wants to try that

## 2018-08-28 NOTE — Telephone Encounter (Signed)
Patient advised. She would like the steroid sent to [facility] pharmacy.

## 2018-09-04 ENCOUNTER — Ambulatory Visit (INDEPENDENT_AMBULATORY_CARE_PROVIDER_SITE_OTHER): Payer: Medicare Other | Admitting: Family Medicine

## 2018-09-04 ENCOUNTER — Encounter: Payer: Self-pay | Admitting: Family Medicine

## 2018-09-04 VITALS — BP 138/69 | HR 76 | Temp 97.6°F | Wt 316.6 lb

## 2018-09-04 DIAGNOSIS — M5432 Sciatica, left side: Secondary | ICD-10-CM

## 2018-09-04 DIAGNOSIS — M47816 Spondylosis without myelopathy or radiculopathy, lumbar region: Secondary | ICD-10-CM | POA: Diagnosis not present

## 2018-09-04 MED ORDER — METHYLPREDNISOLONE ACETATE 40 MG/ML IJ SUSP
40.0000 mg | Freq: Once | INTRAMUSCULAR | Status: AC
  Administered 2018-09-04: 40 mg via INTRAMUSCULAR

## 2018-09-04 MED ORDER — KETOROLAC TROMETHAMINE 60 MG/2ML IM SOLN
60.0000 mg | Freq: Once | INTRAMUSCULAR | Status: AC
  Administered 2018-09-04: 60 mg via INTRAMUSCULAR

## 2018-09-04 NOTE — Progress Notes (Signed)
Patient: Tara Potter Female    DOB: 10/29/1945   73 y.o.   MRN: [id number] Visit Date: 09/04/2018  Today's Provider: Shirlee LatchAngela Emony Dormer, MD   Chief Complaint  Patient presents with  . Sciatica   Subjective:    I, Presley RaddleNikki Walston, CMA, am acting as a scribe for Shirlee LatchAngela Camera Krienke, MD.   HPI Sciatica: Patient presents today C/O left side sciatica radiating down the left leg. Patient called the office on 08/28/2018. She started Prednisone 20 mg. She reports good compliance with treatment plan. She also tried Diclofenac, warm bath, and cold compresses with no relief. She states symptoms are unchanged.   Prednisone helped some, but not entirely.  No weakness/numbness, urinary/stoll continence at baseline.  Allergies  Allergen Reactions  . Amoxicillin Diarrhea    Augmentin  . Entex T [Pseudoephedrine-Guaifenesin]     keeps awake; pt states she still takes this  . Erythromycin Diarrhea     Current Outpatient Medications:  .  aspirin EC 81 MG tablet, Take 81 mg by mouth daily. , Disp: , Rfl:  .  bisoprolol-hydrochlorothiazide (ZIAC) 5-6.25 MG tablet, TAKE 1 TABLET BY MOUTH ONCE DAILY, Disp: 90 tablet, Rfl: 3 .  diclofenac (VOLTAREN) 75 MG EC tablet, TAKE 1 TABLET BY MOUTH TWICE DAILY, Disp: 180 tablet, Rfl: 3 .  diclofenac sodium (VOLTAREN) 1 % GEL, Apply 2 g topically 4 (four) times daily., Disp: 100 g, Rfl: 3 .  fluticasone (FLONASE) 50 MCG/ACT nasal spray, Place 2 sprays into both nostrils daily., Disp: 16 g, Rfl: 11 .  levothyroxine (SYNTHROID, LEVOTHROID) 75 MCG tablet, TAKE 1 TABLET BY MOUTH ONCE DAILY, Disp: 90 tablet, Rfl: 3 .  lovastatin (MEVACOR) 20 MG tablet, Take 1 tablet (20 mg total) by mouth at bedtime., Disp: 90 tablet, Rfl: 1 .  Multiple Vitamins-Minerals (MULTIVITAL PO), Take by mouth daily., Disp: , Rfl:  .  nystatin (MYCOSTATIN/NYSTOP) powder, Apply topically 4 (four) times daily. To rash, Disp: 60 g, Rfl: 5  Review of Systems  Constitutional: Negative.     Respiratory: Negative.   Cardiovascular: Negative.   Gastrointestinal: Negative.   Musculoskeletal:       Left side Sciatica and leg pain     Social History   Tobacco Use  . Smoking status: Never Smoker  . Smokeless tobacco: Never Used  Substance Use Topics  . Alcohol use: Not Currently      Objective:   BP 138/69 (BP Location: Left Arm, Patient Position: Sitting, Cuff Size: Large)   Pulse 76   Temp 97.6 F (36.4 C) (Oral)   Wt (!) 316 lb 9.6 oz (143.6 kg)   SpO2 94%   BMI 45.43 kg/m  Vitals:   09/04/18 1424  BP: 138/69  Pulse: 76  Temp: 97.6 F (36.4 C)  TempSrc: Oral  SpO2: 94%  Weight: (!) 316 lb 9.6 oz (143.6 kg)     Physical Exam Vitals signs reviewed.  Constitutional:      General: She is not in acute distress.    Appearance: Normal appearance. She is well-developed. She is not diaphoretic.  HENT:     Head: Normocephalic and atraumatic.     Right Ear: External ear normal.     Left Ear: External ear normal.     Nose: Nose normal.     Mouth/Throat:     Pharynx: Oropharynx is clear.  Eyes:     General: No scleral icterus.    Conjunctiva/sclera: Conjunctivae normal.  Neck:     Musculoskeletal: Neck  supple.     Thyroid: No thyromegaly.  Cardiovascular:     Rate and Rhythm: Normal rate and regular rhythm.     Heart sounds: Normal heart sounds. No murmur.  Pulmonary:     Effort: Pulmonary effort is normal. No respiratory distress.     Breath sounds: Normal breath sounds. No wheezing or rales.  Musculoskeletal:     Right lower leg: No edema.     Left lower leg: No edema.     Comments: Back: No midline spinous process tenderness to palpation.  No obvious deformities or erythema.  Tenderness to palpation over left SI joint and upper buttock.  Left lower extremity strength intact.  Sensation intact to light touch.  Positive straight leg raise.  Lymphadenopathy:     Cervical: No cervical adenopathy.  Skin:    General: Skin is warm and dry.      Capillary Refill: Capillary refill takes less than 2 seconds.     Findings: No rash.  Neurological:     Mental Status: She is alert and oriented to person, place, and time. Mental status is at baseline.     Sensory: No sensory deficit.     Motor: No weakness.  Psychiatric:        Mood and Affect: Mood normal.        Behavior: Behavior normal.        Thought Content: Thought content normal.         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 -Some improvement with prednisone, but not complete -She is taking Voltaren chronically - She will hold her next 2 doses and we will give IM Toradol injection in addition to IM Depo-Medrol -We discussed that if she does not have improvement with this, she may need to consider epidural steroid injection or other intervention, so she would be referred to pain management or orthopedics -We will obtain new lumbar spine x-rays to evaluate degree of degeneration -No red flags or need for MRI at this point - DG Lumbar Spine Complete; Future - methylPREDNISolone acetate (DEPO-MEDROL) injection 40 mg - ketorolac (TORADOL) injection 60 mg    Meds ordered this encounter  Medications  . methylPREDNISolone acetate (DEPO-MEDROL) injection 40 mg  . ketorolac (TORADOL) injection 60 mg     Return if symptoms worsen or fail to improve.   The entirety of the information documented in the History of Present Illness, Review of Systems and Physical Exam were personally obtained by me. Portions of this information were initially documented by [name], CMA and reviewed by me for thoroughness and accuracy.    Erasmo Downer, MD, MPH [facility] 09/04/2018 4:16 PM

## 2018-09-04 NOTE — Patient Instructions (Signed)
Sciatica    Sciatica is pain, numbness, weakness, or tingling along the path of the sciatic nerve. The sciatic nerve starts in the lower back and runs down the back of each leg. The nerve controls the muscles in the lower leg and in the back of the knee. It also provides feeling (sensation) to the back of the thigh, the lower leg, and the sole of the foot. Sciatica is a symptom of another medical condition that pinches or puts pressure on the sciatic nerve.  Generally, sciatica only affects one side of the body. Sciatica usually goes away on its own or with treatment. In some cases, sciatica may keep coming back (recur).  What are the causes?  This condition is caused by pressure on the sciatic nerve, or pinching of the sciatic nerve. This may be the result of:  · A disk in between the bones of the spine (vertebrae) bulging out too far (herniated disk).  · Age-related changes in the spinal disks (degenerative disk disease).  · A pain disorder that affects a muscle in the buttock (piriformis syndrome).  · Extra bone growth (bone spur) near the sciatic nerve.  · An injury or break (fracture) of the pelvis.  · Pregnancy.  · Tumor (rare).  What increases the risk?  The following factors may make you more likely to develop this condition:  · Playing sports that place pressure or stress on the spine, such as football or weight lifting.  · Having poor strength and flexibility.  · A history of back injury.  · A history of back surgery.  · Sitting for long periods of time.  · Doing activities that involve repetitive bending or lifting.  · Obesity.  What are the signs or symptoms?  Symptoms can vary from mild to very severe, and they may include:  · Any of these problems in the lower back, leg, hip, or buttock:  ? Mild tingling or dull aches.  ? Burning sensations.  ? Sharp pains.  · Numbness in the back of the calf or the sole of the foot.  · Leg weakness.  · Severe back pain that makes movement difficult.  These symptoms  may get worse when you cough, sneeze, or laugh, or when you sit or stand for long periods of time. Being overweight may also make symptoms worse. In some cases, symptoms may recur over time.  How is this diagnosed?  This condition may be diagnosed based on:  · Your symptoms.  · A physical exam. Your health care provider may ask you to do certain movements to check whether those movements trigger your symptoms.  · You may have tests, including:  ? Blood tests.  ? X-rays.  ? MRI.  ? CT scan.  How is this treated?  In many cases, this condition improves on its own, without any treatment. However, treatment may include:  · Reducing or modifying physical activity during periods of pain.  · Exercising and stretching to strengthen your abdomen and improve the flexibility of your spine.  · Icing and applying heat to the affected area.  · Medicines that help:  ? To relieve pain and swelling.  ? To relax your muscles.  · Injections of medicines that help to relieve pain, irritation, and inflammation around the sciatic nerve (steroids).  · Surgery.  Follow these instructions at home:  Medicines  · Take over-the-counter and prescription medicines only as told by your health care provider.  · Do not drive or operate heavy   machinery while taking prescription pain medicine.  Managing pain  · If directed, apply ice to the affected area.  ? Put ice in a plastic bag.  ? Place a towel between your skin and the bag.  ? Leave the ice on for 20 minutes, 2-3 times a day.  · After icing, apply heat to the affected area before you exercise or as often as told by your health care provider. Use the heat source that your health care provider recommends, such as a moist heat pack or a heating pad.  ? Place a towel between your skin and the heat source.  ? Leave the heat on for 20-30 minutes.  ? Remove the heat if your skin turns bright red. This is especially important if you are unable to feel pain, heat, or cold. You may have a greater risk  of getting burned.  Activity  · Return to your normal activities as told by your health care provider. Ask your health care provider what activities are safe for you.  ? Avoid activities that make your symptoms worse.  · Take brief periods of rest throughout the day. Resting in a lying or standing position is usually better than sitting to rest.  ? When you rest for longer periods, mix in some mild activity or stretching between periods of rest. This will help to prevent stiffness and pain.  ? Avoid sitting for long periods of time without moving. Get up and move around at least one time each hour.  · Exercise and stretch regularly, as told by your health care provider.  · Do not lift anything that is heavier than 10 lb (4.5 kg) while you have symptoms of sciatica. When you do not have symptoms, you should still avoid heavy lifting, especially repetitive heavy lifting.  · When you lift objects, always use proper lifting technique, which includes:  ? Bending your knees.  ? Keeping the load close to your body.  ? Avoiding twisting.  General instructions  · Use good posture.  ? Avoid leaning forward while sitting.  ? Avoid hunching over while standing.  · Maintain a healthy weight. Excess weight puts extra stress on your back and makes it difficult to maintain good posture.  · Wear supportive, comfortable shoes. Avoid wearing high heels.  · Avoid sleeping on a mattress that is too soft or too hard. A mattress that is firm enough to support your back when you sleep may help to reduce your pain.  · Keep all follow-up visits as told by your health care provider. This is important.  Contact a health care provider if:  · You have pain that wakes you up when you are sleeping.  · You have pain that gets worse when you lie down.  · Your pain is worse than you have experienced in the past.  · Your pain lasts longer than 4 weeks.  · You experience unexplained weight loss.  Get help right away if:  · You lose control of your  bowel or bladder (incontinence).  · You have:  ? Weakness in your lower back, pelvis, buttocks, or legs that gets worse.  ? Redness or swelling of your back.  ? A burning sensation when you urinate.  This information is not intended to replace advice given to you by your health care provider. Make sure you discuss any questions you have with your health care provider.  Document Released: 07/24/2001 Document Revised: 01/03/2016 Document Reviewed: 04/08/2015  Elsevier Interactive Patient Education ©   2019 [employer] Inc.

## 2018-10-27 ENCOUNTER — Ambulatory Visit: Payer: Self-pay | Admitting: Family Medicine

## 2018-10-29 ENCOUNTER — Telehealth: Payer: Self-pay | Admitting: Family Medicine

## 2018-10-29 ENCOUNTER — Other Ambulatory Visit: Payer: Self-pay | Admitting: Family Medicine

## 2018-10-29 DIAGNOSIS — M5432 Sciatica, left side: Secondary | ICD-10-CM

## 2018-10-29 DIAGNOSIS — E785 Hyperlipidemia, unspecified: Secondary | ICD-10-CM

## 2018-10-29 MED ORDER — PREDNISONE 20 MG PO TABS: ORAL_TABLET | ORAL | 0 refills | Status: DC

## 2018-10-29 MED ORDER — LOVASTATIN 20 MG PO TABS: 20.0000 mg | ORAL_TABLET | Freq: Every day | ORAL | 1 refills | Status: DC

## 2018-10-29 NOTE — Telephone Encounter (Signed)
Patient is calling back wanting her pres. For sciatica by 5:00 today please.  She said she is hurting a lot.   [facility]

## 2018-10-29 NOTE — Telephone Encounter (Signed)
Pt is having sciatica pain and wants to know if Dr. B send something to the pharmacy  Also she needs a refill on her lovastatin 20 mg  [facility] [place]  CB#  [phone]  Thanks  teri

## 2018-10-29 NOTE — Telephone Encounter (Signed)
Patient was advised and medication send to pharmacy. 

## 2018-10-29 NOTE — Telephone Encounter (Signed)
Ok to send Prednisone 20mg  tabs - Take 60mg  PO daily x 2 days, then40mg  PO daily x 2 days, then 20mg  PO daily x 3 days #13 r0 to pharmacy.  If this continues to flare, may need to try a different tactic.

## 2018-10-29 NOTE — Telephone Encounter (Signed)
Patient also would like a RX for sciatica.

## 2018-11-17 ENCOUNTER — Ambulatory Visit (INDEPENDENT_AMBULATORY_CARE_PROVIDER_SITE_OTHER): Payer: Medicare Other | Admitting: Family Medicine

## 2018-11-17 ENCOUNTER — Telehealth: Payer: Self-pay

## 2018-11-17 DIAGNOSIS — M5432 Sciatica, left side: Secondary | ICD-10-CM | POA: Diagnosis not present

## 2018-11-17 MED ORDER — PREDNISONE 20 MG PO TABS: ORAL_TABLET | ORAL | 0 refills | Status: DC

## 2018-11-17 MED ORDER — GABAPENTIN 100 MG PO CAPS: 100.0000 mg | ORAL_CAPSULE | Freq: Three times a day (TID) | ORAL | 3 refills | Status: DC

## 2018-11-17 NOTE — Progress Notes (Addendum)
Patient: Tara Potter Female    DOB: 04/25/1946   73 y.o.   MRN: [id number] Visit Date: 11/28/2018  Today's Provider: Shirlee Latch, MD   Chief Complaint  Patient presents with  . Back Pain   Subjective:    Virtual Visit via Video Note  I connected with Anuoluwapo Vetsch Captain on 11/28/18 at  3:00 PM EDT by a video enabled telemedicine application and verified that I am speaking with the correct person using two identifiers.   I discussed the limitations of evaluation and management by telemedicine and the availability of in person appointments. The patient expressed understanding and agreed to proceed.   Patient location: home Provider location: home office Persons involved in the visit: patient, provider   Back Pain  This is a recurrent problem.  Patient reports pain in her left lower back that radiates down her left leg her posterior thigh.  She has had this multiple times.  States is not as bad as it was previously, but she knows that it is going to get worse.  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  She was last treated for an episode of sciatica last month.  She states that the prednisone taper helped significantly and it got better.  This episode started last night.  She took her Voltaren and Tylenol which helps some.  She has never taken a medicine for chronic pain.  She does have frequent flares that are worse after long periods of sitting or standing she is limited in her mobility by knee pain at baseline.  She denies any change in urinary or bowel habits, weakness or numbness in her extremities, fevers.   Allergies  Allergen Reactions  . Amoxicillin Diarrhea    Augmentin  . Entex T [Pseudoephedrine-Guaifenesin]     keeps awake; pt states she still takes this  . Erythromycin Diarrhea     Current Outpatient Medications:  .  aspirin EC 81 MG tablet, Take 81 mg by mouth daily. , Disp: , Rfl:  .  bisoprolol-hydrochlorothiazide  (ZIAC) 5-6.25 MG tablet, TAKE 1 TABLET BY MOUTH ONCE DAILY, Disp: 90 tablet, Rfl: 3 .  diclofenac (VOLTAREN) 75 MG EC tablet, TAKE 1 TABLET BY MOUTH TWICE DAILY, Disp: 180 tablet, Rfl: 3 .  diclofenac sodium (VOLTAREN) 1 % GEL, Apply 2 g topically 4 (four) times daily., Disp: 100 g, Rfl: 3 .  fluticasone (FLONASE) 50 MCG/ACT nasal spray, Place 2 sprays into both nostrils daily., Disp: 16 g, Rfl: 11 .  gabapentin (NEURONTIN) 100 MG capsule, Take 1 capsule (100 mg total) by mouth 3 (three) times daily., Disp: 90 capsule, Rfl: 3 .  levothyroxine (SYNTHROID, LEVOTHROID) 75 MCG tablet, TAKE 1 TABLET BY MOUTH ONCE DAILY, Disp: 90 tablet, Rfl: 3 .  lovastatin (MEVACOR) 20 MG tablet, Take 1 tablet (20 mg total) by mouth at bedtime., Disp: 90 tablet, Rfl: 1 .  Multiple Vitamins-Minerals (MULTIVITAL PO), Take by mouth daily., Disp: , Rfl:  .  nystatin (MYCOSTATIN/NYSTOP) powder, Apply topically 4 (four) times daily. To rash, Disp: 60 g, Rfl: 5 .  predniSONE (DELTASONE) 20 MG tablet, 60 mg for 2 days, 40 mg for 2 days, 20 mg for 3 days., Disp: 13 tablet, Rfl: 0  Review of Systems  Constitutional: Negative.   Respiratory: Negative.   Cardiovascular: Negative.   Musculoskeletal: Positive for back pain.    Social History   Tobacco Use  . Smoking status: Never Smoker  .  Smokeless tobacco: Never Used  Substance Use Topics  . Alcohol use: Not Currently      Objective:   There were no vitals taken for this visit. There were no vitals filed for this visit.   Physical Exam Constitutional:      Appearance: Normal appearance.  Eyes:     Conjunctiva/sclera: Conjunctivae normal.  Pulmonary:     Effort: Pulmonary effort is normal. No respiratory distress.  Neurological:     Mental Status: She is alert and oriented to person, place, and time. Mental status is at baseline.  Psychiatric:        Mood and Affect: Mood normal.        Behavior: Behavior normal.         Assessment & Plan     I  discussed the assessment and treatment plan with the patient. The patient was provided an opportunity to ask questions and all were answered. The patient agreed with the plan and demonstrated an understanding of the instructions.   The patient was advised to call back or seek an in-person evaluation if the symptoms worsen or if the condition fails to improve as anticipated.  Problem List Items Addressed This Visit      Nervous and Auditory   Sciatica of left side    Frequent flares of left-sided sciatica Discussed that I do not want to keep treating her with prednisone burst so frequently We discussed gabapentin daily for chronic pain management and control of symptoms Patient agrees to try this We discussed possible side effects We will start 100 mg 3 times daily If she tolerates well, we may increase dose in the future, but will start low given her age and comorbidities Prescribed another prednisone burst that she can take if this does not start getting better in the next few days with the gabapentin No red flags or need for imaging at this time Return precautions discussed      Relevant Medications   gabapentin (NEURONTIN) 100 MG capsule   predniSONE (DELTASONE) 20 MG tablet       Return if symptoms worsen or fail to improve.   The entirety of the information documented in the History of Present Illness, Review of Systems and Physical Exam were personally obtained by me. Portions of this information were initially documented by [name], CMA and reviewed by me for thoroughness and accuracy.    Namish Krise, Marzella Schlein, MD, MPH [facility]

## 2018-11-17 NOTE — Telephone Encounter (Signed)
Patient advised. E-visit scheduled at 3:00pm

## 2018-11-17 NOTE — Telephone Encounter (Signed)
Patient is requesting a refill on Prednisone because she is having a flare up of back pain again. [facility]

## 2018-11-17 NOTE — Telephone Encounter (Signed)
Just did a course 3 weeks ago.  That is too soon.  We can offer an evisit and see if we can get her on a medication to prevent this from happening so often or if she needs a referral for injections or something.

## 2018-11-19 DIAGNOSIS — M5432 Sciatica, left side: Secondary | ICD-10-CM | POA: Insufficient documentation

## 2018-11-19 NOTE — Assessment & Plan Note (Signed)
Frequent flares of left-sided sciatica Discussed that I do not want to keep treating her with prednisone burst so frequently We discussed gabapentin daily for chronic pain management and control of symptoms Patient agrees to try this We discussed possible side effects We will start 100 mg 3 times daily If she tolerates well, we may increase dose in the future, but will start low given her age and comorbidities Prescribed another prednisone burst that she can take if this does not start getting better in the next few days with the gabapentin No red flags or need for imaging at this time Return precautions discussed

## 2018-11-19 NOTE — Patient Instructions (Signed)
Sciatica    Sciatica is pain, numbness, weakness, or tingling along your sciatic nerve. The sciatic nerve starts in the lower back and goes down the back of each leg. Sciatica happens when this nerve is pinched or has pressure put on it. Sciatica usually goes away on its own or with treatment. Sometimes, sciatica may keep coming back (recur).  Follow these instructions at home:  Medicines  · Take over-the-counter and prescription medicines only as told by your doctor.  · Do not drive or use heavy machinery while taking prescription pain medicine.  Managing pain  · If directed, put ice on the affected area.  ? Put ice in a plastic bag.  ? Place a towel between your skin and the bag.  ? Leave the ice on for 20 minutes, 2-3 times a day.  · After icing, apply heat to the affected area before you exercise or as often as told by your doctor. Use the heat source that your doctor tells you to use, such as a moist heat pack or a heating pad.  ? Place a towel between your skin and the heat source.  ? Leave the heat on for 20-30 minutes.  ? Remove the heat if your skin turns bright red. This is especially important if you are unable to feel pain, heat, or cold. You may have a greater risk of getting burned.  Activity  · Return to your normal activities as told by your doctor. Ask your doctor what activities are safe for you.  ? Avoid activities that make your sciatica worse.  · Take short rests during the day. Rest in a lying or standing position. This is usually better than sitting to rest.  ? When you rest for a long time, do some physical activity or stretching between periods of rest.  ? Avoid sitting for a long time without moving. Get up and move around at least one time each hour.  · Exercise and stretch regularly, as told by your doctor.  · Do not lift anything that is heavier than 10 lb (4.5 kg) while you have symptoms of sciatica.  ? Avoid lifting heavy things even when you do not have symptoms.  ? Avoid lifting  heavy things over and over.  · When you lift objects, always lift in a way that is safe for your body. To do this, you should:  ? Bend your knees.  ? Keep the object close to your body.  ? Avoid twisting.  General instructions  · Use good posture.  ? Avoid leaning forward when you are sitting.  ? Avoid hunching over when you are standing.  · Stay at a healthy weight.  · Wear comfortable shoes that support your feet. Avoid wearing high heels.  · Avoid sleeping on a mattress that is too soft or too hard. You might have less pain if you sleep on a mattress that is firm enough to support your back.  · Keep all follow-up visits as told by your doctor. This is important.  Contact a doctor if:  · You have pain that:  ? Wakes you up when you are sleeping.  ? Gets worse when you lie down.  ? Is worse than the pain you have had in the past.  ? Lasts longer than 4 weeks.  · You lose weight for without trying.  Get help right away if:  · You cannot control when you pee (urinate) or poop (have a bowel movement).  · You   have weakness in any of these areas and it gets worse.  ? Lower back.  ? Lower belly (pelvis).  ? Butt (buttocks).  ? Legs.  · You have redness or swelling of your back.  · You have a burning feeling when you pee.  This information is not intended to replace advice given to you by your health care provider. Make sure you discuss any questions you have with your health care provider.  Document Released: 05/08/2008 Document Revised: 01/05/2016 Document Reviewed: 04/08/2015  Elsevier Interactive Patient Education © 2019 [employer] Inc[employer]

## 2018-12-01 ENCOUNTER — Ambulatory Visit: Payer: Self-pay | Admitting: Family Medicine

## 2018-12-10 ENCOUNTER — Other Ambulatory Visit: Payer: Self-pay

## 2018-12-10 ENCOUNTER — Telehealth: Payer: Self-pay | Admitting: Family Medicine

## 2018-12-10 ENCOUNTER — Ambulatory Visit (INDEPENDENT_AMBULATORY_CARE_PROVIDER_SITE_OTHER): Payer: Medicare Other | Admitting: Physician Assistant

## 2018-12-10 VITALS — BP 136/82 | HR 78 | Temp 97.8°F | Resp 17 | Wt 314.0 lb

## 2018-12-10 DIAGNOSIS — R3915 Urgency of urination: Secondary | ICD-10-CM

## 2018-12-10 LAB — POCT URINALYSIS DIPSTICK
Bilirubin, UA: NEGATIVE
Blood, UA: NEGATIVE
Glucose, UA: NEGATIVE
Ketones, UA: NEGATIVE
Leukocytes, UA: NEGATIVE
Nitrite, UA: NEGATIVE
Protein, UA: NEGATIVE
Spec Grav, UA: 1.015 (ref 1.010–1.025)
Urobilinogen, UA: 0.2 E.U./dL
pH, UA: 5 (ref 5.0–8.0)

## 2018-12-10 MED ORDER — CEPHALEXIN 500 MG PO CAPS: 500.0000 mg | ORAL_CAPSULE | Freq: Two times a day (BID) | ORAL | 0 refills | Status: DC

## 2018-12-10 NOTE — Patient Instructions (Signed)

## 2018-12-10 NOTE — Progress Notes (Signed)
Patient: Tara Potter Female    DOB: 05/04/1946   73 y.o.   MRN: [id number] Visit Date: 12/10/2018  Today's Provider: Trey SailorsAdriana M Pollak, PA-C   No chief complaint on file.  Subjective:     HPI  Patient is having full feeling in bladder, urgency, and nocturia. Symptoms started 1 week ago. Patient is using generic AZO pills, but doesn't help much. She reports urinary frequency is only at night. Denies fevers, chills, nausea, abdominal pain. She is taking a diuretic but has done so for years. She believes she has sleep apnea - has had witnessed episodes of apnea - but had trial of CPAP machine that she is unable to tolerate.   Allergies  Allergen Reactions  . Amoxicillin Diarrhea    Augmentin  . Entex T [Pseudoephedrine-Guaifenesin]     keeps awake; pt states she still takes this  . Erythromycin Diarrhea     Current Outpatient Medications:  .  aspirin EC 81 MG tablet, Take 81 mg by mouth daily. , Disp: , Rfl:  .  bisoprolol-hydrochlorothiazide (ZIAC) 5-6.25 MG tablet, TAKE 1 TABLET BY MOUTH ONCE DAILY, Disp: 90 tablet, Rfl: 3 .  diclofenac (VOLTAREN) 75 MG EC tablet, TAKE 1 TABLET BY MOUTH TWICE DAILY, Disp: 180 tablet, Rfl: 3 .  fluticasone (FLONASE) 50 MCG/ACT nasal spray, Place 2 sprays into both nostrils daily., Disp: 16 g, Rfl: 11 .  gabapentin (NEURONTIN) 100 MG capsule, Take 1 capsule (100 mg total) by mouth 3 (three) times daily., Disp: 90 capsule, Rfl: 3 .  levothyroxine (SYNTHROID, LEVOTHROID) 75 MCG tablet, TAKE 1 TABLET BY MOUTH ONCE DAILY, Disp: 90 tablet, Rfl: 3 .  lovastatin (MEVACOR) 20 MG tablet, Take 1 tablet (20 mg total) by mouth at bedtime., Disp: 90 tablet, Rfl: 1 .  diclofenac sodium (VOLTAREN) 1 % GEL, Apply 2 g topically 4 (four) times daily., Disp: 100 g, Rfl: 3 .  Multiple Vitamins-Minerals (MULTIVITAL PO), Take by mouth daily., Disp: , Rfl:  .  nystatin (MYCOSTATIN/NYSTOP) powder, Apply topically 4 (four) times daily. To rash (Patient not taking:  Reported on 12/10/2018), Disp: 60 g, Rfl: 5 .  predniSONE (DELTASONE) 20 MG tablet, 60 mg for 2 days, 40 mg for 2 days, 20 mg for 3 days. (Patient not taking: Reported on 12/10/2018), Disp: 13 tablet, Rfl: 0  Review of Systems  Genitourinary: Positive for urgency.  All other systems reviewed and are negative.   Social History   Tobacco Use  . Smoking status: Never Smoker  . Smokeless tobacco: Never Used  Substance Use Topics  . Alcohol use: Not Currently      Objective:   BP 136/82 (BP Location: Left Arm, Patient Position: Sitting, Cuff Size: Large)   Pulse 78   Temp 97.8 F (36.6 C) (Oral)   Resp 17   Wt (!) 314 lb (142.4 kg)   BMI 45.05 kg/m  Vitals:   12/10/18 1432  BP: 136/82  Pulse: 78  Resp: 17  Temp: 97.8 F (36.6 C)  TempSrc: Oral  Weight: (!) 314 lb (142.4 kg)     Physical Exam Constitutional:      General: She is not in acute distress.    Appearance: Normal appearance. She is well-developed. She is obese. She is not diaphoretic.  Cardiovascular:     Rate and Rhythm: Normal rate and regular rhythm.  Pulmonary:     Effort: Pulmonary effort is normal.     Breath sounds: Normal breath sounds.  Abdominal:     General: Bowel sounds are normal. There is no distension.     Palpations: Abdomen is soft.     Tenderness: There is abdominal tenderness in the suprapubic area. There is no guarding or rebound.  Skin:    General: Skin is warm and dry.  Neurological:     Mental Status: She is alert and oriented to person, place, and time.  Psychiatric:        Mood and Affect: Mood normal.        Behavior: Behavior normal.         Assessment & Plan    1. Urgency of urination  Will treat as below based on last urine culture and allergies. Urine dipstick unreadable due to AZO, will await culture. If not UTI and symptoms persist, might consider bladder prolapse or untreated sleep apnea.   - POCT urinalysis dipstick - cephALEXin (KEFLEX) 500 MG capsule; Take 1  capsule (500 mg total) by mouth 2 (two) times daily for 7 days.  Dispense: 14 capsule; Refill: 0  The entirety of the information documented in the History of Present Illness, Review of Systems and Physical Exam were personally obtained by me. Portions of this information were initially documented by Lexine Baton, LPN and reviewed by me for thoroughness and accuracy.   F/u PRN.     Trey Sailors, PA-C  [facility]

## 2018-12-16 ENCOUNTER — Telehealth: Payer: Self-pay

## 2018-12-16 DIAGNOSIS — N3090 Cystitis, unspecified without hematuria: Secondary | ICD-10-CM

## 2018-12-16 MED ORDER — CIPROFLOXACIN HCL 250 MG PO TABS: 250.0000 mg | ORAL_TABLET | Freq: Two times a day (BID) | ORAL | 0 refills | Status: DC

## 2018-12-16 NOTE — Telephone Encounter (Signed)
Sent in cipro 250 mg BID x 3 days. Risk of achilles tendon rupture with this medication. She can do trial of this medicationbut if symptoms not resolved we will have to consider other causes like OAB, bladder prolapse, untreated sleep apnea, etc and she'd likely need follow up visit.

## 2018-12-16 NOTE — Telephone Encounter (Signed)
Patient called to check on the results of her urine culture from the last visit. I don't see an order for a urine culture. Was this done. She says the antibiotic that she was placed in is starting to upset her stomach.

## 2018-12-16 NOTE — Telephone Encounter (Signed)
I'm sorry, I don't see the culture was sent. I apologize to her. How is she feeling symptoms wise? If she is feeling better symptomatically she can stop the antibiotics.

## 2018-12-16 NOTE — Telephone Encounter (Signed)
Patient states symptoms are slightly improved. Patient states the symptoms are worse at night. During the day she is experiencing urinary urgency. She states previously she was prescribed Cipro for similar symptoms and tolerated the medication well. If willing [facility] [place]

## 2018-12-17 NOTE — Telephone Encounter (Signed)
Spoke to pt and advised there information given per Adrianna.  Pt advised that the other medication keflex made sick to her stomach and nausea.  I advised pt not to take the medication.  dbs

## 2018-12-19 ENCOUNTER — Other Ambulatory Visit: Payer: Self-pay | Admitting: *Deleted

## 2018-12-19 NOTE — Telephone Encounter (Signed)
Patient called office requesting Cipro rx be extended to 5 days instead of 3 days. Please advise.

## 2018-12-19 NOTE — Telephone Encounter (Signed)
[id number] 

## 2018-12-23 NOTE — Telephone Encounter (Signed)
I spoke with patient and she reports her symptoms are resolved.

## 2018-12-23 NOTE — Telephone Encounter (Signed)
If she is still having symptoms I think she should be seen again, we did talk about other causes of her symptoms including prolapse and untreated sleep apnea. 3 days of cipro should clear a urinary tract infection and she was treated with an antibiotic before this.

## 2018-12-31 ENCOUNTER — Ambulatory Visit: Payer: Self-pay | Admitting: Family Medicine

## 2018-12-31 ENCOUNTER — Ambulatory Visit: Payer: Self-pay | Admitting: Physician Assistant

## 2019-01-01 ENCOUNTER — Other Ambulatory Visit: Payer: Self-pay

## 2019-01-01 ENCOUNTER — Ambulatory Visit (INDEPENDENT_AMBULATORY_CARE_PROVIDER_SITE_OTHER): Payer: Medicare Other | Admitting: Family Medicine

## 2019-01-01 ENCOUNTER — Encounter: Payer: Self-pay | Admitting: Family Medicine

## 2019-01-01 VITALS — BP 128/80 | HR 79 | Temp 98.1°F | Wt 317.6 lb

## 2019-01-01 DIAGNOSIS — M5432 Sciatica, left side: Secondary | ICD-10-CM

## 2019-01-01 DIAGNOSIS — I10 Essential (primary) hypertension: Secondary | ICD-10-CM

## 2019-01-01 DIAGNOSIS — M170 Bilateral primary osteoarthritis of knee: Secondary | ICD-10-CM | POA: Diagnosis not present

## 2019-01-01 DIAGNOSIS — E7800 Pure hypercholesterolemia, unspecified: Secondary | ICD-10-CM

## 2019-01-01 MED ORDER — LIDOCAINE HCL (PF) 1 % IJ SOLN
4.0000 mL | Freq: Once | INTRAMUSCULAR | Status: AC
  Administered 2019-01-01: 4 mL

## 2019-01-01 MED ORDER — METHYLPREDNISOLONE ACETATE 40 MG/ML IJ SUSP
40.0000 mg | Freq: Once | INTRAMUSCULAR | Status: AC
  Administered 2019-01-01: 40 mg

## 2019-01-01 NOTE — Assessment & Plan Note (Signed)
Stable Continue lovastatin

## 2019-01-01 NOTE — Progress Notes (Signed)
Patient: Tara Potter Female    DOB: 11/27/1945   73 y.o.   MRN: [id number] Visit Date: 01/01/2019  Today's Provider: Shirlee LatchAngela Bronson Bressman, MD   Chief Complaint  Patient presents with  . Hypertension  . Hyperlipidemia  . Sciatica   Subjective:    I, Presley RaddleNikki Walston, CMA, am acting as a scribe for Shirlee LatchAngela Ariann Khaimov, MD.    HPI   Patient continues to have b/l knee pain.  Last corticosteroid injections in 07/2018.  She gets about 3 months of relief from these.  She is not a good candidate for knee replacement due to morbid obesity.  She finds that getting regular corticosteroid injections in her knees helps her to be able to function and walk around more  She states that her sciatica pain is significantly better since starting gabapentin.  She is only taking this twice daily due to sedation effects.  Allergies  Allergen Reactions  . Amoxicillin Diarrhea    Augmentin  . Entex T [Pseudoephedrine-Guaifenesin]     keeps awake; pt states she still takes this  . Erythromycin Diarrhea     Current Outpatient Medications:  .  aspirin EC 81 MG tablet, Take 81 mg by mouth daily. , Disp: , Rfl:  .  bisoprolol-hydrochlorothiazide (ZIAC) 5-6.25 MG tablet, TAKE 1 TABLET BY MOUTH ONCE DAILY, Disp: 90 tablet, Rfl: 3 .  ciprofloxacin (CIPRO) 250 MG tablet, Take 1 tablet (250 mg total) by mouth 2 (two) times daily., Disp: 6 tablet, Rfl: 0 .  diclofenac (VOLTAREN) 75 MG EC tablet, TAKE 1 TABLET BY MOUTH TWICE DAILY, Disp: 180 tablet, Rfl: 3 .  diclofenac sodium (VOLTAREN) 1 % GEL, Apply 2 g topically 4 (four) times daily., Disp: 100 g, Rfl: 3 .  fluticasone (FLONASE) 50 MCG/ACT nasal spray, Place 2 sprays into both nostrils daily., Disp: 16 g, Rfl: 11 .  gabapentin (NEURONTIN) 100 MG capsule, Take 1 capsule (100 mg total) by mouth 3 (three) times daily., Disp: 90 capsule, Rfl: 3 .  levothyroxine (SYNTHROID, LEVOTHROID) 75 MCG tablet, TAKE 1 TABLET BY MOUTH ONCE DAILY, Disp: 90 tablet, Rfl: 3 .   lovastatin (MEVACOR) 20 MG tablet, Take 1 tablet (20 mg total) by mouth at bedtime., Disp: 90 tablet, Rfl: 1 .  Multiple Vitamins-Minerals (MULTIVITAL PO), Take by mouth daily., Disp: , Rfl:   Current Facility-Administered Medications:  .  lidocaine (PF) (XYLOCAINE) 1 % injection 4 mL, 4 mL, Other, Once, Tipton Ballow, Marzella SchleinAngela M, MD .  lidocaine (PF) (XYLOCAINE) 1 % injection 4 mL, 4 mL, Other, Once, Ina Scrivens, Marzella SchleinAngela M, MD .  methylPREDNISolone acetate (DEPO-MEDROL) injection 40 mg, 40 mg, Other, Once, Jamell Opfer, Marzella SchleinAngela M, MD .  methylPREDNISolone acetate (DEPO-MEDROL) injection 40 mg, 40 mg, Other, Once, Hibo Blasdell, Marzella SchleinAngela M, MD   Review of Systems  Constitutional: Negative.   Respiratory: Negative.   Cardiovascular: Negative.   Musculoskeletal: Positive for arthralgias.    Social History   Tobacco Use  . Smoking status: Never Smoker  . Smokeless tobacco: Never Used  Substance Use Topics  . Alcohol use: Not Currently      Objective:   BP 128/80 (BP Location: Left Arm, Patient Position: Sitting, Cuff Size: Large)   Pulse 79   Temp 98.1 F (36.7 C) (Oral)   Wt (!) 317 lb 9.6 oz (144.1 kg)   SpO2 98%   BMI 45.57 kg/m  Vitals:   01/01/19 1450  BP: 128/80  Pulse: 79  Temp: 98.1 F (36.7 C)  TempSrc: Oral  SpO2: 98%  Weight: (!) 317 lb 9.6 oz (144.1 kg)     Physical Exam Constitutional:      Appearance: Normal appearance.  HENT:     Head: Normocephalic and atraumatic.  Eyes:     Conjunctiva/sclera: Conjunctivae normal.  Cardiovascular:     Rate and Rhythm: Normal rate and regular rhythm.     Pulses: Normal pulses.  Pulmonary:     Effort: Pulmonary effort is normal. No respiratory distress.  Musculoskeletal:     Comments: Antalgic gait.  Walks with cane.  Difficulty stepping up to get on the exam table.  Bossing of bilateral knees.  No tenderness to palpation.  Skin:    General: Skin is warm and dry.     Capillary Refill: Capillary refill takes less than 2  seconds.     Findings: No rash.  Neurological:     Mental Status: She is alert and oriented to person, place, and time. Mental status is at baseline.  Psychiatric:        Mood and Affect: Mood normal.        Behavior: Behavior normal.         Assessment & Plan   Problem List Items Addressed This Visit      Cardiovascular and Mediastinum   Essential (primary) hypertension    Well-controlled today Continue current medications Reviewed metabolic panel Follow-up in 3 to 6 months        Nervous and Auditory   Sciatica of left side    Pain is significantly improved Continue gabapentin 100 mg twice daily She seems to be well controlled on this dose, so we do not need to titrate it No red flags or need for imaging at this time Return precautions discussed        Musculoskeletal and Integument   Arthritis, degenerative - Primary    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 Repeat steroid injections in bilateral knees again today Discussed that we cannot continue these no more than every 3 months She is not currently a candidate for total knee replacement given her morbid obesity Advised on weight loss, diet, exercise Can use Voltaren gel as needed Discussed long-term risks of chronic NSAIDs She can also continue scheduled Tylenol to help with arthritis pain See procedure note below      Relevant Medications   lidocaine (PF) (XYLOCAINE) 1 % injection 4 mL (Start on 01/01/2019  3:30 PM)   lidocaine (PF) (XYLOCAINE) 1 % injection 4 mL (Start on 01/01/2019  3:30 PM)   methylPREDNISolone acetate (DEPO-MEDROL) injection 40 mg (Start on 01/01/2019  3:30 PM)   methylPREDNISolone acetate (DEPO-MEDROL) injection 40 mg (Start on 01/01/2019  3:30 PM)     Other   HLD (hyperlipidemia)    Stable Continue lovastatin         INJECTION: Patient was given informed consent,. Appropriate time out was taken. Area prepped and draped in usual sterile fashion.  1 cc  of depo-medrol 40 mg/ml plus 4 cc of 1% lidocaine without epinephrine was injected into the bilateral knees using a(n) anterior lateral approach. The patient tolerated the procedure well. There were no complications. Post procedure instructions were given.   Return in about 3 months (around 04/03/2019) for Chronic disease follow-up and repeat knee injections if needed.   The entirety of the information documented in the History of Present Illness, Review of Systems and Physical Exam were personally obtained by me. Portions of this information were initially documented by  [name], CMA and reviewed by me for thoroughness and accuracy.    Glory Graefe, Marzella Schlein, MD MPH [facility]

## 2019-01-01 NOTE — Assessment & Plan Note (Signed)
Well-controlled today Continue current medications Reviewed metabolic panel Follow-up in 3 to 6 months

## 2019-01-01 NOTE — Assessment & Plan Note (Signed)
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

## 2019-01-01 NOTE — Assessment & Plan Note (Signed)
Pain is significantly improved Continue gabapentin 100 mg twice daily She seems to be well controlled on this dose, so we do not need to titrate it No red flags or need for imaging at this time Return precautions discussed

## 2019-04-01 ENCOUNTER — Other Ambulatory Visit: Payer: Self-pay | Admitting: Family Medicine

## 2019-04-01 DIAGNOSIS — I10 Essential (primary) hypertension: Secondary | ICD-10-CM

## 2019-04-06 ENCOUNTER — Other Ambulatory Visit: Payer: Self-pay

## 2019-04-06 ENCOUNTER — Encounter: Payer: Self-pay | Admitting: Family Medicine

## 2019-04-06 ENCOUNTER — Ambulatory Visit (INDEPENDENT_AMBULATORY_CARE_PROVIDER_SITE_OTHER): Payer: Medicare Other | Admitting: Family Medicine

## 2019-04-06 VITALS — BP 136/70 | HR 66 | Temp 96.9°F | Wt 312.0 lb

## 2019-04-06 DIAGNOSIS — M170 Bilateral primary osteoarthritis of knee: Secondary | ICD-10-CM

## 2019-04-06 DIAGNOSIS — R7303 Prediabetes: Secondary | ICD-10-CM

## 2019-04-06 DIAGNOSIS — E039 Hypothyroidism, unspecified: Secondary | ICD-10-CM

## 2019-04-06 DIAGNOSIS — E7800 Pure hypercholesterolemia, unspecified: Secondary | ICD-10-CM

## 2019-04-06 DIAGNOSIS — Z23 Encounter for immunization: Secondary | ICD-10-CM

## 2019-04-06 DIAGNOSIS — I10 Essential (primary) hypertension: Secondary | ICD-10-CM | POA: Diagnosis not present

## 2019-04-06 MED ORDER — LIDOCAINE HCL (PF) 1 % IJ SOLN
2.0000 mL | Freq: Once | INTRAMUSCULAR | Status: AC
  Administered 2019-04-06: 2 mL via INTRADERMAL

## 2019-04-06 MED ORDER — METHYLPREDNISOLONE ACETATE 40 MG/ML IJ SUSP
40.0000 mg | Freq: Once | INTRAMUSCULAR | Status: AC
  Administered 2019-04-06: 40 mg via INTRAMUSCULAR

## 2019-04-06 NOTE — Patient Instructions (Signed)
Journal for Nurse Practitioners, 15(4), 263-267. Retrieved May 19, 2018 from http://clinicalkey.com/nursing">  Knee Exercises Ask your health care provider which exercises are safe for you. Do exercises exactly as told by your health care provider and adjust them as directed. It is normal to feel mild stretching, pulling, tightness, or discomfort as you do these exercises. Stop right away if you feel sudden pain or your pain gets worse. Do not begin these exercises until told by your health care provider. Stretching and range-of-motion exercises These exercises warm up your muscles and joints and improve the movement and flexibility of your knee. These exercises also help to relieve pain and swelling. Knee extension, prone 1. Lie on your abdomen (prone position) on a bed. 2. Place your left / right knee just beyond the edge of the surface so your knee is not on the bed. You can put a towel under your left / right thigh just above your kneecap for comfort. 3. Relax your leg muscles and allow gravity to straighten your knee (extension). You should feel a stretch behind your left / right knee. 4. Hold this position for __________ seconds. 5. Scoot up so your knee is supported between repetitions. Repeat __________ times. Complete this exercise __________ times a day. Knee flexion, active  1. Lie on your back with both legs straight. If this causes back discomfort, bend your left / right knee so your foot is flat on the floor. 2. Slowly slide your left / right heel back toward your buttocks. Stop when you feel a gentle stretch in the front of your knee or thigh (flexion). 3. Hold this position for __________ seconds. 4. Slowly slide your left / right heel back to the starting position. Repeat __________ times. Complete this exercise __________ times a day. Quadriceps stretch, prone  1. Lie on your abdomen on a firm surface, such as a bed or padded floor. 2. Bend your left / right knee and hold  your ankle. If you cannot reach your ankle or pant leg, loop a belt around your foot and grab the belt instead. 3. Gently pull your heel toward your buttocks. Your knee should not slide out to the side. You should feel a stretch in the front of your thigh and knee (quadriceps). 4. Hold this position for __________ seconds. Repeat __________ times. Complete this exercise __________ times a day. Hamstring, supine 1. Lie on your back (supine position). 2. Loop a belt or towel over the ball of your left / right foot. The ball of your foot is on the walking surface, right under your toes. 3. Straighten your left / right knee and slowly pull on the belt to raise your leg until you feel a gentle stretch behind your knee (hamstring). ? Do not let your knee bend while you do this. ? Keep your other leg flat on the floor. 4. Hold this position for __________ seconds. Repeat __________ times. Complete this exercise __________ times a day. Strengthening exercises These exercises build strength and endurance in your knee. Endurance is the ability to use your muscles for a long time, even after they get tired. Quadriceps, isometric This exercise stretches the muscles in front of your thigh (quadriceps) without moving your knee joint (isometric). 1. Lie on your back with your left / right leg extended and your other knee bent. Put a rolled towel or small pillow under your knee if told by your health care provider. 2. Slowly tense the muscles in the front of your left /   right thigh. You should see your kneecap slide up toward your hip or see increased dimpling just above the knee. This motion will push the back of the knee toward the floor. 3. For __________ seconds, hold the muscle as tight as you can without increasing your pain. 4. Relax the muscles slowly and completely. Repeat __________ times. Complete this exercise __________ times a day. Straight leg raises This exercise stretches the muscles in front  of your thigh (quadriceps) and the muscles that move your hips (hip flexors). 1. Lie on your back with your left / right leg extended and your other knee bent. 2. Tense the muscles in the front of your left / right thigh. You should see your kneecap slide up or see increased dimpling just above the knee. Your thigh may even shake a bit. 3. Keep these muscles tight as you raise your leg 4-6 inches (10-15 cm) off the floor. Do not let your knee bend. 4. Hold this position for __________ seconds. 5. Keep these muscles tense as you lower your leg. 6. Relax your muscles slowly and completely after each repetition. Repeat __________ times. Complete this exercise __________ times a day. Hamstring, isometric 1. Lie on your back on a firm surface. 2. Bend your left / right knee about __________ degrees. 3. Dig your left / right heel into the surface as if you are trying to pull it toward your buttocks. Tighten the muscles in the back of your thighs (hamstring) to "dig" as hard as you can without increasing any pain. 4. Hold this position for __________ seconds. 5. Release the tension gradually and allow your muscles to relax completely for __________ seconds after each repetition. Repeat __________ times. Complete this exercise __________ times a day. Hamstring curls If told by your health care provider, do this exercise while wearing ankle weights. Begin with __________ lb weights. Then increase the weight by 1 lb (0.5 kg) increments. Do not wear ankle weights that are more than __________ lb. 1. Lie on your abdomen with your legs straight. 2. Bend your left / right knee as far as you can without feeling pain. Keep your hips flat against the floor. 3. Hold this position for __________ seconds. 4. Slowly lower your leg to the starting position. Repeat __________ times. Complete this exercise __________ times a day. Squats This exercise strengthens the muscles in front of your thigh and knee  (quadriceps). 1. Stand in front of a table, with your feet and knees pointing straight ahead. You may rest your hands on the table for balance but not for support. 2. Slowly bend your knees and lower your hips like you are going to sit in a chair. ? Keep your weight over your heels, not over your toes. ? Keep your lower legs upright so they are parallel with the table legs. ? Do not let your hips go lower than your knees. ? Do not bend lower than told by your health care provider. ? If your knee pain increases, do not bend as low. 3. Hold the squat position for __________ seconds. 4. Slowly push with your legs to return to standing. Do not use your hands to pull yourself to standing. Repeat __________ times. Complete this exercise __________ times a day. Wall slides This exercise strengthens the muscles in front of your thigh and knee (quadriceps). 1. Lean your back against a smooth wall or door, and walk your feet out 18-24 inches (46-61 cm) from it. 2. Place your feet hip-width apart. 3.   Slowly slide down the wall or door until your knees bend __________ degrees. Keep your knees over your heels, not over your toes. Keep your knees in line with your hips. 4. Hold this position for __________ seconds. Repeat __________ times. Complete this exercise __________ times a day. Straight leg raises This exercise strengthens the muscles that rotate the leg at the hip and move it away from your body (hip abductors). 1. Lie on your side with your left / right leg in the top position. Lie so your head, shoulder, knee, and hip line up. You may bend your bottom knee to help you keep your balance. 2. Roll your hips slightly forward so your hips are stacked directly over each other and your left / right knee is facing forward. 3. Leading with your heel, lift your top leg 4-6 inches (10-15 cm). You should feel the muscles in your outer hip lifting. ? Do not let your foot drift forward. ? Do not let your knee  roll toward the ceiling. 4. Hold this position for __________ seconds. 5. Slowly return your leg to the starting position. 6. Let your muscles relax completely after each repetition. Repeat __________ times. Complete this exercise __________ times a day. Straight leg raises This exercise stretches the muscles that move your hips away from the front of the pelvis (hip extensors). 1. Lie on your abdomen on a firm surface. You can put a pillow under your hips if that is more comfortable. 2. Tense the muscles in your buttocks and lift your left / right leg about 4-6 inches (10-15 cm). Keep your knee straight as you lift your leg. 3. Hold this position for __________ seconds. 4. Slowly lower your leg to the starting position. 5. Let your leg relax completely after each repetition. Repeat __________ times. Complete this exercise __________ times a day. This information is not intended to replace advice given to you by your health care provider. Make sure you discuss any questions you have with your health care provider. Document Released: 06/13/2005 Document Revised: 05/20/2018 Document Reviewed: 05/20/2018 Elsevier Patient Education  2020 [employer]  

## 2019-04-06 NOTE — Assessment & Plan Note (Signed)
Recheck A1c Discussed diet and exercise 

## 2019-04-06 NOTE — Assessment & Plan Note (Signed)
Discussed importance of healthy weight management ?Discussed diet and exercise  ?Congratulated on weight loss ?

## 2019-04-06 NOTE — Assessment & Plan Note (Signed)
Stable Continue statin Recheck lipid panel and CMP 

## 2019-04-06 NOTE — Assessment & Plan Note (Signed)
Well controlled Continue current medications Recheck metabolic panel F/u in 3-6 months  

## 2019-04-06 NOTE — Assessment & Plan Note (Signed)
Stable Recheck TSH Continue synthroid at current dose pending TSH results

## 2019-04-06 NOTE — Progress Notes (Signed)
Patient: Tara Potter Threats Female    DOB: 04/26/1946   73 y.o.   MRN: [id number] Visit Date: 04/06/2019  Today's Provider: Shirlee LatchAngela Pelagia Iacobucci, MD   Chief Complaint  Patient presents with  . Hypertension  . Hyperlipidemia  . Knee Pain   Subjective:    I, Porsha McClurkin CMA, am acting as a scribe for Shirlee LatchAngela Jeronica Stlouis, MD.   HPI   Hypertension, follow-up:  BP Readings from Last 3 Encounters:  04/06/19 136/70  01/01/19 128/80  12/10/18 136/82    She was last seen for hypertension 3 months ago.  BP at that visit was 128/80. Management since that visit includes none.She reports good compliance with treatment. She is not having side effects.  She is not exercising. She is adherent to low salt diet.   Outside blood pressures are not checked. She is experiencing none.  Patient denies chest pain, chest pressure/discomfort, exertional chest pressure/discomfort, fatigue, irregular heart beat, palpitations and tachypnea.   Cardiovascular risk factors include advanced age (older than 5355 for men, 6665 for women) and hypertension.  Use of agents associated with hypertension: none.   ------------------------------------------------------------------------    Lipid/Cholesterol, Follow-up:   Last seen for this 3 months ago.  Management since that visit includes none.  Last Lipid Panel:    Component Value Date/Time   CHOL 163 04/30/2018 1047   TRIG 121 04/30/2018 1047   HDL 45 04/30/2018 1047   CHOLHDL 3.6 04/30/2018 1047   LDLCALC 94 04/30/2018 1047    She reports good compliance with treatment. She is not having side effects.   Wt Readings from Last 3 Encounters:  04/06/19 (!) 312 lb (141.5 kg)  01/01/19 (!) 317 lb 9.6 oz (144.1 kg)  12/10/18 (!) 314 lb (142.4 kg)    ------------------------------------------------------------------------  Knee Pain Patient presents today for bilateral knee pain. Patient last office visit was 01/01/2019. Patient had knee injections  and states good compliance with that treatment. Patient states her knee pain now is from running more errands lately due to her husband having a recent fall.  Feels like it is not as helpful anymore. Also tried Synvisc in the past without improvement.   Allergies  Allergen Reactions  . Amoxicillin Diarrhea    Augmentin  . Entex T [Pseudoephedrine-Guaifenesin]     keeps awake; pt states she still takes this  . Erythromycin Diarrhea     Current Outpatient Medications:  .  aspirin EC 81 MG tablet, Take 81 mg by mouth daily. , Disp: , Rfl:  .  bisoprolol-hydrochlorothiazide (ZIAC) 5-6.25 MG tablet, Take 1 tablet by mouth once daily, Disp: 90 tablet, Rfl: 1 .  ciprofloxacin (CIPRO) 250 MG tablet, Take 1 tablet (250 mg total) by mouth 2 (two) times daily., Disp: 6 tablet, Rfl: 0 .  diclofenac (VOLTAREN) 75 MG EC tablet, TAKE 1 TABLET BY MOUTH TWICE DAILY, Disp: 180 tablet, Rfl: 3 .  diclofenac sodium (VOLTAREN) 1 % GEL, Apply 2 g topically 4 (four) times daily., Disp: 100 g, Rfl: 3 .  fluticasone (FLONASE) 50 MCG/ACT nasal spray, Place 2 sprays into both nostrils daily., Disp: 16 g, Rfl: 11 .  gabapentin (NEURONTIN) 100 MG capsule, Take 1 capsule (100 mg total) by mouth 3 (three) times daily., Disp: 90 capsule, Rfl: 3 .  levothyroxine (SYNTHROID, LEVOTHROID) 75 MCG tablet, TAKE 1 TABLET BY MOUTH ONCE DAILY, Disp: 90 tablet, Rfl: 3 .  lovastatin (MEVACOR) 20 MG tablet, Take 1 tablet (20 mg total) by mouth at bedtime.,  Disp: 90 tablet, Rfl: 1 .  Multiple Vitamins-Minerals (MULTIVITAL PO), Take by mouth daily., Disp: , Rfl:   Review of Systems  Constitutional: Negative.   Respiratory: Negative.   Musculoskeletal: Negative.   Hematological: Negative.     Social History   Tobacco Use  . Smoking status: Never Smoker  . Smokeless tobacco: Never Used  Substance Use Topics  . Alcohol use: Not Currently      Objective:   BP 136/70 (BP Location: Left Arm, Patient Position: Sitting, Cuff  Size: Large)   Pulse 66   Temp (!) 96.9 F (36.1 Potter) (Temporal)   Wt (!) 312 lb (141.5 kg)   SpO2 97%   BMI 44.77 kg/m  Vitals:   04/06/19 1335  BP: 136/70  Pulse: 66  Temp: (!) 96.9 F (36.1 Potter)  TempSrc: Temporal  SpO2: 97%  Weight: (!) 312 lb (141.5 kg)     Physical Exam Vitals signs reviewed.  Constitutional:      General: She is not in acute distress.    Appearance: Normal appearance. She is well-developed. She is not diaphoretic.  HENT:     Head: Normocephalic and atraumatic.  Eyes:     General: No scleral icterus.    Conjunctiva/sclera: Conjunctivae normal.  Neck:     Musculoskeletal: Neck supple.     Thyroid: No thyromegaly.  Cardiovascular:     Rate and Rhythm: Normal rate and regular rhythm.     Pulses: Normal pulses.     Heart sounds: Normal heart sounds. No murmur.  Pulmonary:     Effort: Pulmonary effort is normal. No respiratory distress.     Breath sounds: Normal breath sounds. No wheezing, rhonchi or rales.  Musculoskeletal:     Right lower leg: No edema.     Left lower leg: No edema.     Comments: Antalgic gait.  Walks with cane.  Difficulty stepping up to get on the exam table.  Bossing of bilateral knees.  No tenderness to palpation.   Lymphadenopathy:     Cervical: No cervical adenopathy.  Skin:    General: Skin is warm and dry.     Capillary Refill: Capillary refill takes less than 2 seconds.     Findings: No rash.  Neurological:     Mental Status: She is alert and oriented to person, place, and time. Mental status is at baseline.  Psychiatric:        Mood and Affect: Mood normal.        Behavior: Behavior normal.      No results found for any visits on 04/06/19.     Assessment & Plan   Problem List Items Addressed This Visit      Cardiovascular and Mediastinum   Essential (primary) hypertension - Primary    Well controlled Continue current medications Recheck metabolic panel F/u in 3-6 months       Relevant Orders    Comprehensive metabolic panel     Endocrine   Adult hypothyroidism    Stable Recheck TSH Continue synthroid at current dose pending TSH results      Relevant Orders   TSH     Musculoskeletal and Integument   Arthritis, degenerative    OA of b/l knees, T/L spine Repeat steroid injections in bilateral knees again today If helping, would continue no more than q3 months If no longer helping, may need orhto referral Not sure if she'd be a candidate to try Synvisc again Not currently a candidate for TKA given morbid  obesity Continue voltaren gel prn Continue scheduled tylenol and gabapentin qhs        Other   HLD (hyperlipidemia)    Stable Continue statin Recheck lipid panel and CMP      Relevant Orders   Comprehensive metabolic panel   Lipid panel   Prediabetes    Recheck A1c Discussed diet and exercise      Relevant Orders   Hemoglobin A1c   Morbid obesity (HCC)    Discussed importance of healthy weight management Discussed diet and exercise  Congratulated on weight loss       Other Visit Diagnoses    Need for immunization against influenza       Relevant Orders   Flu Vaccine QUAD High Dose(Fluad) (Completed)      INJECTION: Patient was given informed consent,. Appropriate time out was taken. Area prepped and draped in usual sterile fashion. 1 cc of depo-medrol 40 mg/ml plus  4 cc of 1% lidocaine without epinephrine was injected into each of bilateral knees using a(n) anterior lateral approach. The patient tolerated the procedure well. There were no complications. Post procedure instructions were given.    Return in about 3 months (around 07/07/2019) for CPE/AWV.   The entirety of the information documented in the History of Present Illness, Review of Systems and Physical Exam were personally obtained by me. Portions of this information were initially documented by [facility] McClurkin, CMA and reviewed by me for thoroughness and accuracy.    Laure Leone,  Marzella SchleinAngela M, MD MPH [facility]

## 2019-04-06 NOTE — Assessment & Plan Note (Signed)
OA of b/l knees, T/L spine Repeat steroid injections in bilateral knees again today If helping, would continue no more than q3 months If no longer helping, may need orhto referral Not sure if she'd be a candidate to try Synvisc again Not currently a candidate for TKA given morbid obesity Continue voltaren gel prn Continue scheduled tylenol and gabapentin qhs

## 2019-04-07 ENCOUNTER — Telehealth: Payer: Self-pay

## 2019-04-07 LAB — LIPID PANEL
Chol/HDL Ratio: 4.4 ratio (ref 0.0–4.4)
Cholesterol, Total: 192 mg/dL (ref 100–199)
HDL: 44 mg/dL (ref >39)
LDL Calculated: 103 mg/dL — ABNORMAL HIGH (ref 0–99)
Triglycerides: 226 mg/dL — ABNORMAL HIGH (ref 0–149)
VLDL Cholesterol Cal: 45 mg/dL — ABNORMAL HIGH (ref 5–40)

## 2019-04-07 LAB — COMPREHENSIVE METABOLIC PANEL
ALT: 12 IU/L (ref 0–32)
AST: 14 IU/L (ref 0–40)
Albumin/Globulin Ratio: 1.9 (ref 1.2–2.2)
Albumin: 4.1 g/dL (ref 3.7–4.7)
Alkaline Phosphatase: 103 IU/L (ref 39–117)
BUN/Creatinine Ratio: 18 (ref 12–28)
BUN: 19 mg/dL (ref 8–27)
Bilirubin Total: 0.3 mg/dL (ref 0.0–1.2)
CO2: 24 mmol/L (ref 20–29)
Calcium: 9.9 mg/dL (ref 8.7–10.3)
Chloride: 101 mmol/L (ref 96–106)
Creatinine, Ser: 1.04 mg/dL — ABNORMAL HIGH (ref 0.57–1.00)
GFR calc Af Amer: 61 mL/min/{1.73_m2} (ref >59)
GFR calc non Af Amer: 53 mL/min/{1.73_m2} — ABNORMAL LOW (ref >59)
Globulin, Total: 2.2 g/dL (ref 1.5–4.5)
Glucose: 98 mg/dL (ref 65–99)
Potassium: 4.2 mmol/L (ref 3.5–5.2)
Sodium: 140 mmol/L (ref 134–144)
Total Protein: 6.3 g/dL (ref 6.0–8.5)

## 2019-04-07 LAB — HEMOGLOBIN A1C
Est. average glucose Bld gHb Est-mCnc: 120 mg/dL
Hgb A1c MFr Bld: 5.8 % — ABNORMAL HIGH (ref 4.8–5.6)

## 2019-04-07 LAB — TSH: TSH: 2.63 u[IU]/mL (ref 0.450–4.500)

## 2019-04-07 MED ORDER — ATORVASTATIN CALCIUM 10 MG PO TABS: 10.0000 mg | ORAL_TABLET | Freq: Every day | ORAL | 3 refills | Status: DC

## 2019-04-07 NOTE — Telephone Encounter (Signed)
rx sent

## 2019-04-07 NOTE — Telephone Encounter (Signed)
Patient advised. She agrees to switch to Atorvastatin. Pharmacy- [facility].

## 2019-04-07 NOTE — Telephone Encounter (Signed)
-----   Message from Virginia Crews, MD sent at 04/07/2019  9:42 AM EDT ----- Normal electrolytes and liver function.  Kidney function is slightly decreased.  Be sure to stay well hydrated and cut back on NSAIDs. Cholesterol is not to goal, despite Lovastatin.  I'd recommend Atorvastatin low dose instead as this will likely work better.  A1c and TSH stable.

## 2019-04-15 ENCOUNTER — Other Ambulatory Visit: Payer: Self-pay | Admitting: Family Medicine

## 2019-04-15 ENCOUNTER — Telehealth: Payer: Self-pay | Admitting: Family Medicine

## 2019-04-15 DIAGNOSIS — E785 Hyperlipidemia, unspecified: Secondary | ICD-10-CM

## 2019-04-15 MED ORDER — ATORVASTATIN CALCIUM 10 MG PO TABS: 10.0000 mg | ORAL_TABLET | Freq: Every day | ORAL | 3 refills | Status: DC

## 2019-04-15 NOTE — Telephone Encounter (Signed)
atorvastatin (LIPITOR) 10 MG tablet was sent to the wrong pharmacy.  Please resend to  [id number] - [place], [place] - [address] [phone] (Phone) [phone] (Fax)   Thanks, [employer]

## 2019-04-24 ENCOUNTER — Other Ambulatory Visit: Payer: Self-pay | Admitting: Family Medicine

## 2019-05-01 ENCOUNTER — Telehealth: Payer: Self-pay | Admitting: Family Medicine

## 2019-05-01 DIAGNOSIS — M170 Bilateral primary osteoarthritis of knee: Secondary | ICD-10-CM

## 2019-05-01 MED ORDER — GABAPENTIN 300 MG PO CAPS: 300.0000 mg | ORAL_CAPSULE | Freq: Three times a day (TID) | ORAL | 0 refills | Status: DC

## 2019-05-01 MED ORDER — PREDNISONE 20 MG PO TABS: ORAL_TABLET | ORAL | 0 refills | Status: DC

## 2019-05-01 NOTE — Telephone Encounter (Signed)
Rx for prednisone taper sent to pharmacy.  Would also recommend increasing gabapentin to 300mg  TID.  OK to send new Rx #[id number] if patient agrees.

## 2019-05-01 NOTE — Telephone Encounter (Signed)
Patient was advised. Patient states that she is going to try take 2 of the Gabapentin 300mg  at night time just incase is becomes sleep. FYI

## 2019-05-01 NOTE — Addendum Note (Signed)
Addended by: Dan Maker on: 05/01/2019 05:13 PM   Modules accepted: Orders

## 2019-05-01 NOTE — Telephone Encounter (Signed)
Pt has a severe sciatic flare up going down into left leg.  Pt has been taking thegabapentin (NEURONTIN) 100 MG capsule and asking if Dr. Jacinto Reap. Can prescribe her some prednisone to help with pain and relief.  Pt cannot get to office because she is taking care of home bound husband.  Please let pt know.   Pt uses: [id number] - [place], [place] - [address] [phone] (Phone) [phone] (Fax)   Thanks, [facility]

## 2019-05-29 IMAGING — CR DG SHOULDER 2+V*R*
1 series · 4 of 4 positions shown · non-contrast
Comparison: None.

CLINICAL DATA: Approximate three-month history of right shoulder
pain which the patient states happened after she lifted her
grandson. No discrete injury.

EXAM:
RIGHT SHOULDER - 2+ VIEW

[Series 1: dg shoulder right · 0.14mm/px · 4 of 4 slices shown]
[im 1/4]
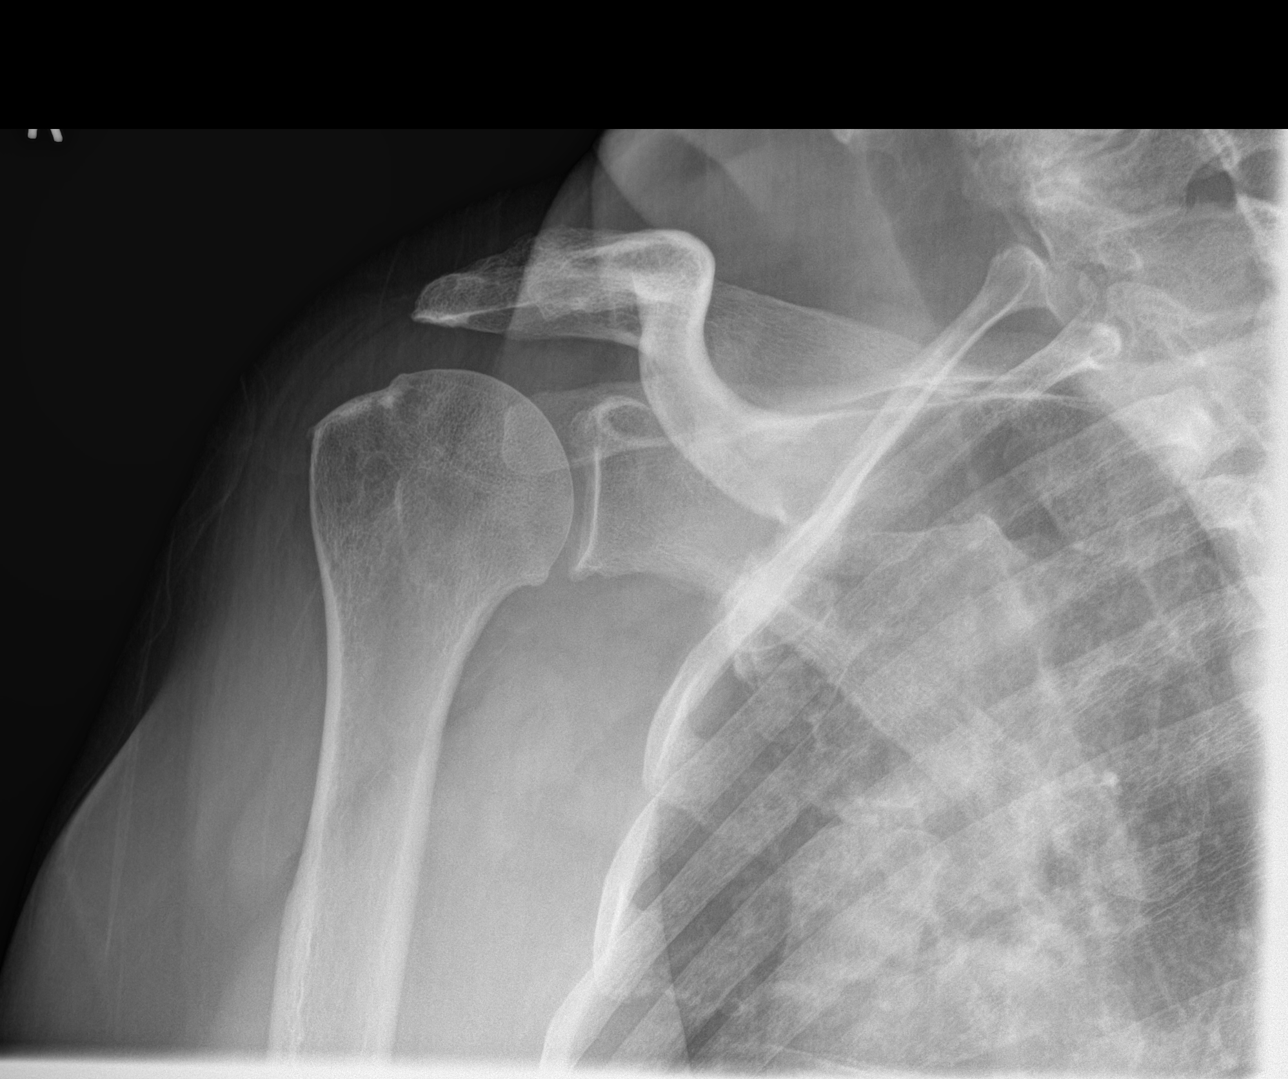
[im 2/4]
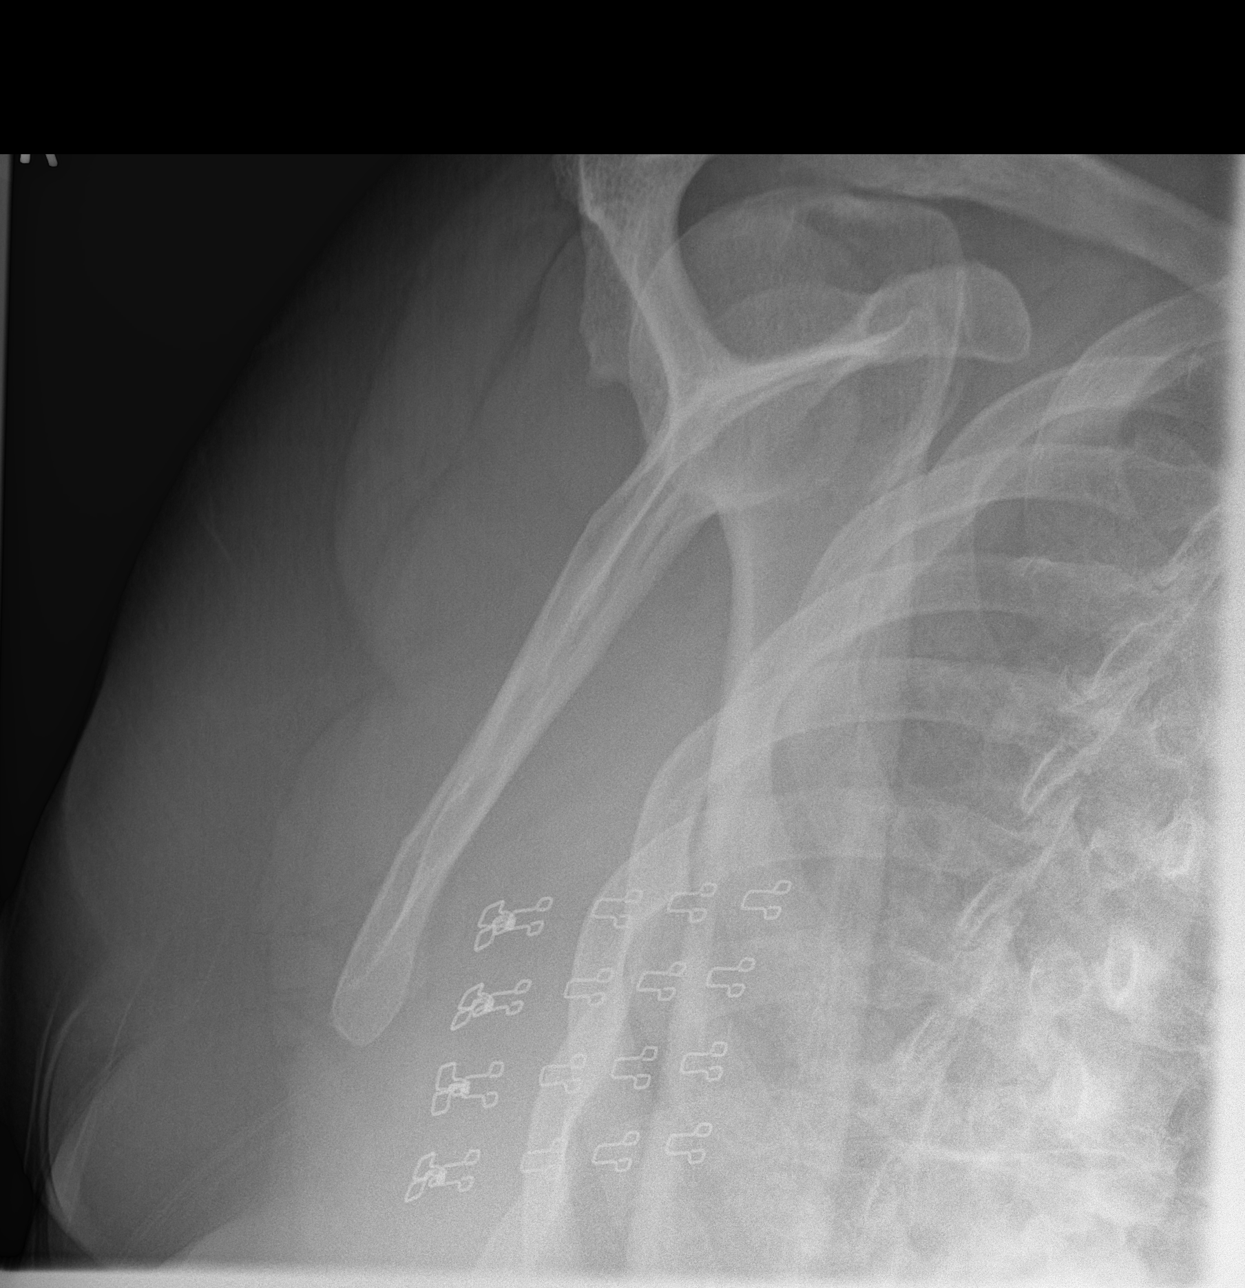
[im 3/4]
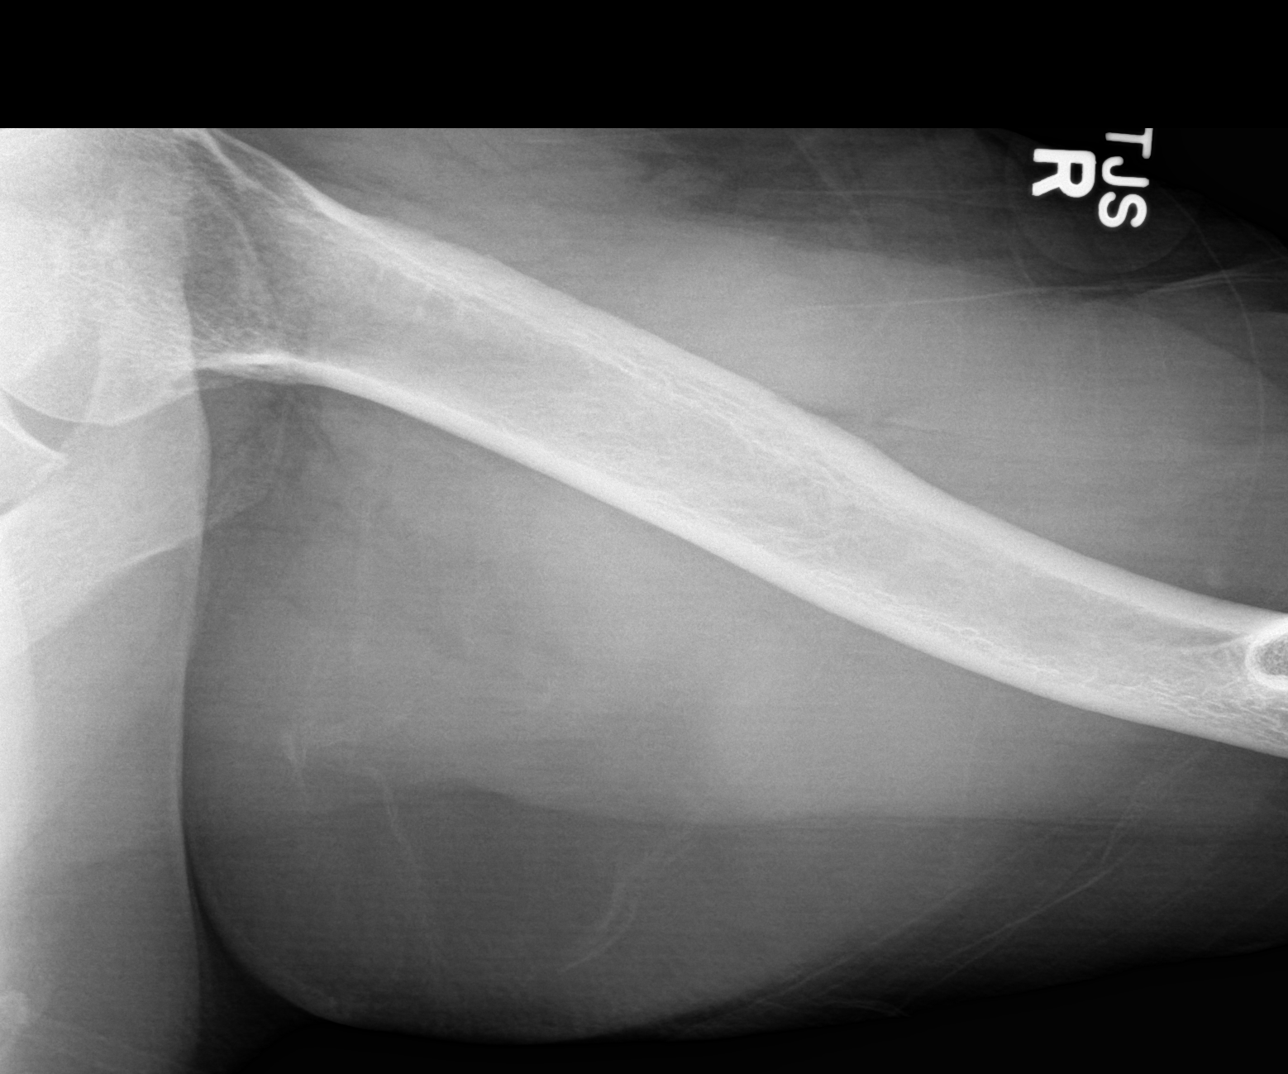
[im 4/4]
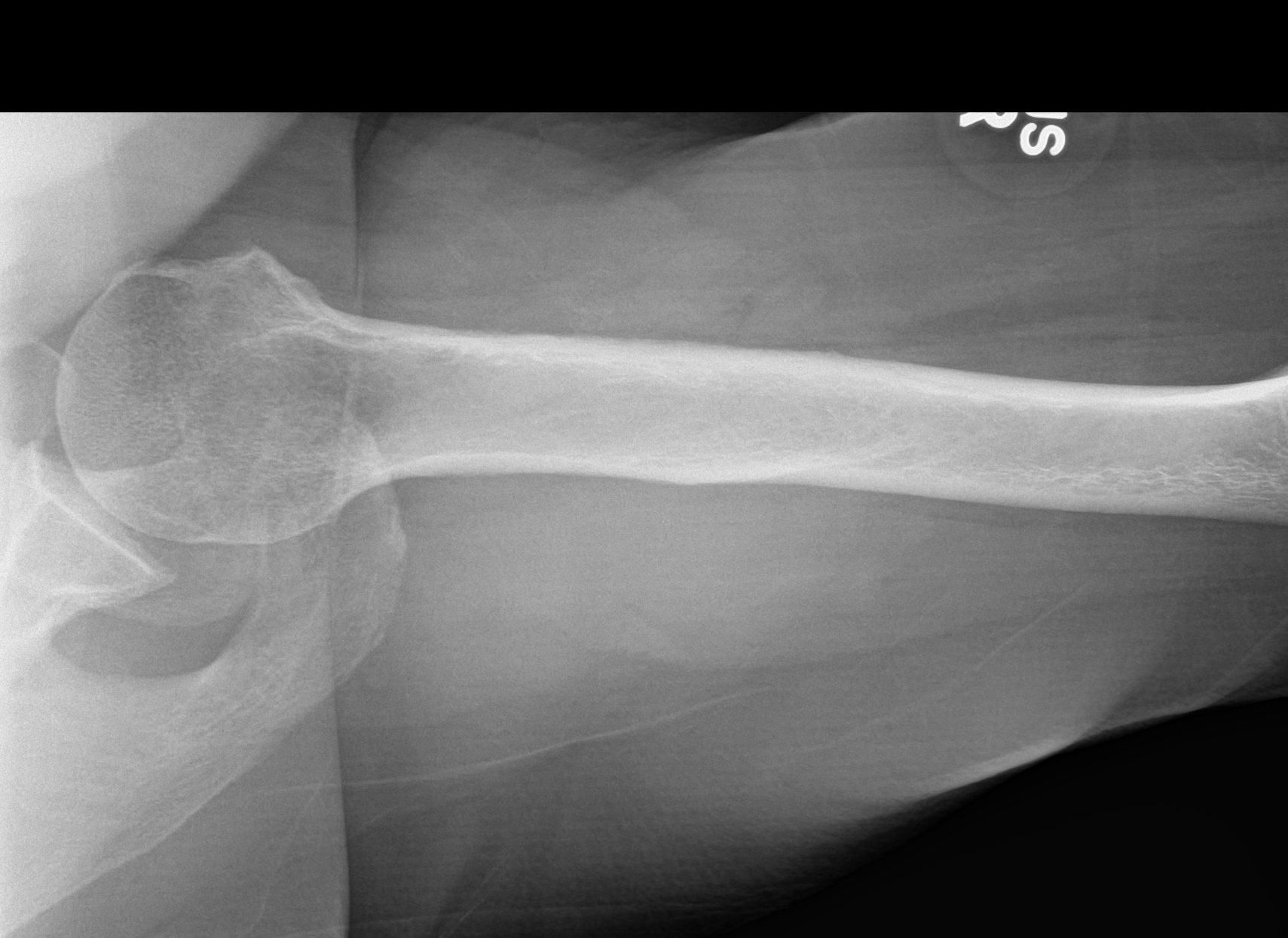

[4 of 4 positions shown; findings below may reference images not displayed]

FINDINGS: No evidence of acute fracture or glenohumeral dislocation.
Glenohumeral joint space well-preserved. Subacromial space
well-preserved. Minimal calcification at the insertion of the
supraspinatus tendon on the greater tuberosity of the humeral head.
Acromioclavicular joint intact. Well preserved bone mineral density.
No intrinsic osseous abnormality.
IMPRESSION: Minimal chronic calcific supraspinatus tendinitis. Otherwise normal
examination.

## 2019-06-09 ENCOUNTER — Other Ambulatory Visit: Payer: Self-pay | Admitting: Family Medicine

## 2019-06-23 ENCOUNTER — Other Ambulatory Visit: Payer: Self-pay | Admitting: Family Medicine

## 2019-06-25 ENCOUNTER — Ambulatory Visit (INDEPENDENT_AMBULATORY_CARE_PROVIDER_SITE_OTHER): Payer: Medicare Other | Admitting: Family Medicine

## 2019-06-25 DIAGNOSIS — R350 Frequency of micturition: Secondary | ICD-10-CM | POA: Diagnosis not present

## 2019-06-25 DIAGNOSIS — R300 Dysuria: Secondary | ICD-10-CM | POA: Diagnosis not present

## 2019-06-25 MED ORDER — CEPHALEXIN 500 MG PO CAPS: 500.0000 mg | ORAL_CAPSULE | Freq: Two times a day (BID) | ORAL | 0 refills | Status: AC

## 2019-06-25 NOTE — Progress Notes (Signed)
Patient: Tara Potter Female    DOB: [birth year]   73 y.o.   MRN: [id number] Visit Date: 06/25/2019  Today's Provider: Shirlee Latch, MD   Chief Complaint  Patient presents with  . Urinary Tract Infection   Subjective:     HPI Urinary Tract Infection: Patient complains of frequency, suprapubic pressure and urgency She has had symptoms for 4 days. Patient also complains of none. Patient denies back pain, fever and vaginal discharge. Patient does have a history of recurrent UTI.  Patient does not have a history of pyelonephritis. Patient reports she has taken AZO and reports mild symptom control.  Allergies  Allergen Reactions  . Amoxicillin Diarrhea    Augmentin  . Entex T [Pseudoephedrine-Guaifenesin]     keeps awake; pt states she still takes this  . Erythromycin Diarrhea     Current Outpatient Medications:  .  aspirin EC 81 MG tablet, Take 81 mg by mouth daily. , Disp: , Rfl:  .  atorvastatin (LIPITOR) 10 MG tablet, Take 1 tablet (10 mg total) by mouth daily., Disp: 90 tablet, Rfl: 3 .  bisoprolol-hydrochlorothiazide (ZIAC) 5-6.25 MG tablet, Take 1 tablet by mouth once daily, Disp: 90 tablet, Rfl: 1 .  diclofenac (VOLTAREN) 75 MG EC tablet, Take 1 tablet by mouth twice daily, Disp: 180 tablet, Rfl: 0 .  EUTHYROX 75 MCG tablet, Take 1 tablet by mouth once daily, Disp: 90 tablet, Rfl: 3 .  fluticasone (FLONASE) 50 MCG/ACT nasal spray, Place 2 sprays into both nostrils daily., Disp: 16 g, Rfl: 11 .  gabapentin (NEURONTIN) 100 MG capsule, TAKE 1 CAPSULE BY MOUTH THREE TIMES A DAY, Disp: 90 capsule, Rfl: 3 .  Multiple Vitamins-Minerals (MULTIVITAL PO), Take by mouth daily., Disp: , Rfl:   Review of Systems  Constitutional: Negative.   Genitourinary: Positive for frequency and pelvic pain.    Social History   Tobacco Use  . Smoking status: Never Smoker  . Smokeless tobacco: Never Used  Substance Use Topics  . Alcohol use: Not Currently      Objective:   There were no vitals taken for this visit. There were no vitals filed for this visit.There is no height or weight on file to calculate BMI.   Physical Exam Speaking in full sentences, NAD  No results found for any visits on 06/25/19.     Assessment & Plan   1. Dysuria 2. Urinary frequency - Patient unable to come into the office today due to bad weather and COVID 19 pandemic - discussed that ideally we would check UA and UCx - given that symptoms are classic for patient's typical UTIs, will go ahead and treat with empiric abx -No systemic symptoms or signs of pyelonephritis -Will start treatment with 5day course of Keflex after reviewing previous urine culture -Discussed return precautions     Meds ordered this encounter  Medications  . cephALEXin (KEFLEX) 500 MG capsule    Sig: Take 1 capsule (500 mg total) by mouth 2 (two) times daily for 5 days.    Dispense:  10 capsule    Refill:  0     Return if symptoms worsen or fail to improve.   The entirety of the information documented in the History of Present Illness, Review of Systems and Physical Exam were personally obtained by me. Portions of this information were initially documented by Rondel Baton, CMA and reviewed by me for thoroughness and accuracy.    , Marzella Schlein, MD MPH [facility]

## 2019-07-15 ENCOUNTER — Encounter: Payer: Self-pay | Admitting: Family Medicine

## 2019-07-30 ENCOUNTER — Other Ambulatory Visit: Payer: Self-pay | Admitting: Family Medicine

## 2019-07-30 DIAGNOSIS — E785 Hyperlipidemia, unspecified: Secondary | ICD-10-CM

## 2019-08-26 ENCOUNTER — Other Ambulatory Visit: Payer: Self-pay | Admitting: Family Medicine

## 2019-08-26 DIAGNOSIS — I10 Essential (primary) hypertension: Secondary | ICD-10-CM

## 2019-08-28 DIAGNOSIS — H903 Sensorineural hearing loss, bilateral: Secondary | ICD-10-CM | POA: Diagnosis not present

## 2019-09-01 NOTE — Progress Notes (Signed)
Subjective:   Tara Potter is a 74 y.o. female who presents for Medicare Annual (Subsequent) preventive examination.    This visit is being conducted through telemedicine due to the COVID-19 pandemic. This patient has given me verbal consent via doximity to conduct this visit, patient states they are participating from their home address. Some vital signs may be absent or patient reported.    Patient identification: identified by name, DOB, and current address  Review of Systems:  N/A  Cardiac Risk Factors include: advanced age (>63men, >19 women);dyslipidemia;hypertension     Objective:     Vitals: There were no vitals taken for this visit.  There is no height or weight on file to calculate BMI. Unable to obtain vitals due to visit being conducted via telephonically.   Advanced Directives 09/02/2019 04/21/2018 04/11/2017 03/12/2016  Does Patient Have a Medical Advance Directive? No No No No  Would patient like information on creating a medical advance directive? No - Patient declined No - Patient declined Yes (ED - Information included in AVS) -    Tobacco Social History   Tobacco Use  Smoking Status Never Smoker  Smokeless Tobacco Never Used     Counseling given: Not Answered   Clinical Intake:  Pre-visit preparation completed: Yes  Pain : No/denies pain Pain Score: 0-No pain     Nutritional Risks: None Diabetes: No  How often do you need to have someone help you when you read instructions, pamphlets, or other written materials from your doctor or pharmacy?: 1 - Never  Interpreter Needed?: No  Information entered by :: [facility], LPN  Past Medical History:  Diagnosis Date  . Arthritis   . Hyperlipidemia   . Hypertension    Past Surgical History:  Procedure Laterality Date  . DILATION AND CURETTAGE OF UTERUS    . TUBAL LIGATION     Family History  Problem Relation Age of Onset  . Hypertension Mother   . Macular degeneration Mother   . Atrial  fibrillation Mother   . Kidney disease Mother   . Heart disease Father   . Arthritis Sister   . Breast cancer Neg Hx    Social History   Socioeconomic History  . Marital status: Widowed    Spouse name: Not on file  . Number of children: 2  . Years of education: Not on file  . Highest education level: Bachelor's degree (e.g., BA, AB, BS)  Occupational History  . Occupation: retired  Tobacco Use  . Smoking status: Never Smoker  . Smokeless tobacco: Never Used  Substance and Sexual Activity  . Alcohol use: Not Currently  . Drug use: No  . Sexual activity: Not on file  Other Topics Concern  . Not on file  Social History Narrative  . Not on file   Social Determinants of Health   Financial Resource Strain: Low Risk   . Difficulty of Paying Living Expenses: Not hard at all  Food Insecurity: No Food Insecurity  . Worried About Programme researcher, broadcasting/film/video in the Last Year: Never true  . Ran Out of Food in the Last Year: Never true  Transportation Needs: No Transportation Needs  . Lack of Transportation (Medical): No  . Lack of Transportation (Non-Medical): No  Physical Activity: Inactive  . Days of Exercise per Week: 0 days  . Minutes of Exercise per Session: 0 min  Stress: Stress Concern Present  . Feeling of Stress : To some extent  Social Connections: Moderately Isolated  .  Frequency of Communication with Friends and Family: More than three times a week  . Frequency of Social Gatherings with Friends and Family: More than three times a week  . Attends Religious Services: Never  . Active Member of Clubs or Organizations: No  . Attends Banker Meetings: Never  . Marital Status: Widowed    Outpatient Encounter Medications as of 09/02/2019  Medication Sig  . acetaminophen (TYLENOL) 650 MG CR tablet Take 650 mg by mouth every 8 (eight) hours as needed for pain.  Marland Kitchen aspirin EC 81 MG tablet Take 81 mg by mouth daily.   Marland Kitchen atorvastatin (LIPITOR) 10 MG tablet Take 1 tablet  (10 mg total) by mouth daily.  . bisoprolol-hydrochlorothiazide (ZIAC) 5-6.25 MG tablet Take 1 tablet by mouth once daily  . diclofenac (VOLTAREN) 75 MG EC tablet Take 1 tablet by mouth twice daily  . EUTHYROX 75 MCG tablet Take 1 tablet by mouth once daily  . fluticasone (FLONASE) 50 MCG/ACT nasal spray Place 2 sprays into both nostrils daily.  Marland Kitchen gabapentin (NEURONTIN) 100 MG capsule TAKE 1 CAPSULE BY MOUTH THREE TIMES A DAY (Patient taking differently: Take 100 mg by mouth at bedtime. )  . Multiple Vitamins-Minerals (MULTIVITAL PO) Take by mouth daily. When remembers   No facility-administered encounter medications on file as of 09/02/2019.    Activities of Daily Living In your present state of health, do you have any difficulty performing the following activities: 09/02/2019  Hearing? Y  Comment Has hearing loss, is currently waiting on hearing aids.  Vision? N  Difficulty concentrating or making decisions? N  Walking or climbing stairs? N  Dressing or bathing? N  Doing errands, shopping? N  Preparing Food and eating ? N  Using the Toilet? N  In the past six months, have you accidently leaked urine? Y  Comment Occasionally with urges.  Do you have problems with loss of bowel control? N  Managing your Medications? N  Managing your Finances? N  Housekeeping or managing your Housekeeping? N  Some recent data might be hidden    Patient Care Team: Erasmo Downer, MD as PCP - General (Family Medicine) Dingeldein, Viviann Spare, MD as Consulting Physician (Ophthalmology)    Assessment:   This is a routine wellness examination for Tara Potter.  Exercise Activities and Dietary recommendations Current Exercise Habits: The patient does not participate in regular exercise at present, Exercise limited by: orthopedic condition(s)  Goals    . Reduce sugar in daily diet     Recommend cutting out all extra sugars in daily diet, no more sweets.        Fall Risk: Fall Risk  09/02/2019  04/21/2018 04/11/2017 03/12/2016  Falls in the past year? 0 Yes No No  Number falls in past yr: 0 1 - -  Injury with Fall? 0 No - -  Follow up - Falls prevention discussed - -    FALL RISK PREVENTION PERTAINING TO THE HOME:  Any stairs in or around the home? Yes  If so, are there any without handrails? No   Home free of loose throw rugs in walkways, pet beds, electrical cords, etc? Yes  Adequate lighting in your home to reduce risk of falls? Yes   ASSISTIVE DEVICES UTILIZED TO PREVENT FALLS:  Life alert? No  Use of a cane, walker or w/c? Yes  Grab bars in the bathroom? Yes  Shower chair or bench in shower? No  Elevated toilet seat or a handicapped toilet? Yes  TIMED UP AND GO:  Was the test performed? No .    Depression Screen PHQ 2/9 Scores 09/02/2019 09/02/2019 04/21/2018 04/21/2018  PHQ - 2 Score 1 1 1 1   PHQ- 9 Score - - 3 -     Cognitive Function: Declined today.     6CIT Screen 04/21/2018  What Year? 0 points  What month? 0 points  What time? 0 points  Count back from 20 0 points  Months in reverse 0 points  Repeat phrase 0 points  Total Score 0    Immunization History  Administered Date(s) Administered  . Fluad Quad(high Dose 65+) 04/06/2019  . Influenza, High Dose Seasonal PF 06/22/2015, 06/07/2016, 04/11/2017, 04/28/2018  . Pneumococcal Conjugate-13 04/11/2017  . Pneumococcal Polysaccharide-23 05/18/2013  . Td 12/29/2004    Qualifies for Shingles Vaccine? Yes . Due for Shingrix. Pt has been advised to call insurance company to determine out of pocket expense. Advised may also receive vaccine at local pharmacy or Health Dept. Verbalized acceptance and understanding.  Tdap: Although this vaccine is not a covered service during a Wellness Exam, does the patient still wish to receive this vaccine today?  No . Advised may receive this vaccine at local pharmacy or Health Dept. Aware to provide a copy of the vaccination record if obtained from local pharmacy or  Health Dept. Verbalized acceptance and understanding.  Flu Vaccine: Up to date  Pneumococcal Vaccine: Completed series  Screening Tests Health Maintenance  Topic Date Due  . TETANUS/TDAP  12/30/2014  . COLON CANCER SCREENING ANNUAL FOBT  06/06/2019  . MAMMOGRAM  07/10/2019  . COLONOSCOPY  09/01/2020 (Originally 01/12/1995)  . DEXA SCAN  08/13/2026 (Originally 01/11/2010)  . INFLUENZA VACCINE  Completed  . Hepatitis C Screening  Completed  . PNA vac Low Risk Adult  Completed    Cancer Screenings:  Colorectal Screening: Currently due. Pt declined referral and cologuard order today.   Mammogram: Completed 07/09/17. Repeat every 1-2 years as advised. Ordered today. Pt provided with contact info and advised to call to schedule appt. Pt aware the office will call re: appt.  Bone Density: Currently due. Ordered today. Pt provided with contact info and advised to call to schedule appt. Pt aware the office will call re: appt.  Lung Cancer Screening: (Low Dose CT Chest recommended if Age 75-80 years, 30 pack-year currently smoking OR have quit w/in 15years.) does not qualify.   Additional Screening:  Hepatitis C Screening: Up to date  Vision Screening: Recommended annual ophthalmology exams for early detection of glaucoma and other disorders of the eye.  Dental Screening: Recommended annual dental exams for proper oral hygiene  Community Resource Referral:  CRR required this visit?  No       Plan:  I have personally reviewed and addressed the Medicare Annual Wellness questionnaire and have noted the following in the patient's chart:  A. Medical and social history B. Use of alcohol, tobacco or illicit drugs  C. Current medications and supplements D. Functional ability and status E.  Nutritional status F.  Physical activity G. Advance directives H. List of other physicians I.  Hospitalizations, surgeries, and ER visits in previous 12 months J.  Vitals K. Screenings such as  hearing and vision if needed, cognitive and depression L. Referrals and appointments   In addition, I have reviewed and discussed with patient certain preventive protocols, quality metrics, and best practice recommendations. A written personalized care plan for preventive services as well as general preventive health recommendations were provided  to patient. Nurse Health Advisor  Signed,    Elvyn Krohn [place], [place]  01/24/4314 Nurse Health Advisor   Nurse Notes: Pt declined a colonoscopy referral and cologuard order today. Pt states she has had a tetanus vaccine in the past 10 years. Requested records to be sent to clinic.

## 2019-09-02 ENCOUNTER — Ambulatory Visit (INDEPENDENT_AMBULATORY_CARE_PROVIDER_SITE_OTHER): Payer: Medicare PPO

## 2019-09-02 ENCOUNTER — Other Ambulatory Visit: Payer: Self-pay

## 2019-09-02 DIAGNOSIS — E2839 Other primary ovarian failure: Secondary | ICD-10-CM

## 2019-09-02 DIAGNOSIS — Z1231 Encounter for screening mammogram for malignant neoplasm of breast: Secondary | ICD-10-CM | POA: Diagnosis not present

## 2019-09-02 DIAGNOSIS — Z0000 Encounter for general adult medical examination without abnormal findings: Secondary | ICD-10-CM

## 2019-09-02 NOTE — Patient Instructions (Signed)
Tara Potter , Thank you for taking time to come for your Medicare Wellness Visit. I appreciate your ongoing commitment to your health goals. Please review the following plan we discussed and let me know if I can assist you in the future.   Screening recommendations/referrals: Colonoscopy: Currently due. Pt declined referral and cologuard order today. Mammogram: Ordered today. Pt provided with contact info and advised to call to schedule appt.  Bone Density: Ordered today. Pt aware the office will call re: appt. Recommended yearly ophthalmology/optometry visit for glaucoma screening and checkup Recommended yearly dental visit for hygiene and checkup  Vaccinations: Influenza vaccine: Up to date Pneumococcal vaccine: Completed series Tdap vaccine: Pt declines today.  Shingles vaccine: Pt declines today.     Advanced directives: Advance directive discussed with you today. Even though you declined this today please call our office should you change your mind and we can give you the proper paperwork for you to fill out.  Conditions/risks identified: Recommend to continue to cut back on sugar in diet and avoid all sweets.   Next appointment: 09/11/19 @ 11:00 AM with Dr Beryle Flock. Declined scheduling an AWV for 2022 at this time.   Preventive Care 20 Years and Older, Female Preventive care refers to lifestyle choices and visits with your health care provider that can promote health and wellness. What does preventive care include?  A yearly physical exam. This is also called an annual well check.  Dental exams once or twice a year.  Routine eye exams. Ask your health care provider how often you should have your eyes checked.  Personal lifestyle choices, including:  Daily care of your teeth and gums.  Regular physical activity.  Eating a healthy diet.  Avoiding tobacco and drug use.  Limiting alcohol use.  Practicing safe sex.  Taking low-dose aspirin every day.  Taking vitamin and  mineral supplements as recommended by your health care provider. What happens during an annual well check? The services and screenings done by your health care provider during your annual well check will depend on your age, overall health, lifestyle risk factors, and family history of disease. Counseling  Your health care provider may ask you questions about your:  Alcohol use.  Tobacco use.  Drug use.  Emotional well-being.  Home and relationship well-being.  Sexual activity.  Eating habits.  History of falls.  Memory and ability to understand (cognition).  Work and work Astronomer.  Reproductive health. Screening  You may have the following tests or measurements:  Height, weight, and BMI.  Blood pressure.  Lipid and cholesterol levels. These may be checked every 5 years, or more frequently if you are over 8 years old.  Skin check.  Lung cancer screening. You may have this screening every year starting at age 53 if you have a 30-pack-year history of smoking and currently smoke or have quit within the past 15 years.  Fecal occult blood test (FOBT) of the stool. You may have this test every year starting at age 61.  Flexible sigmoidoscopy or colonoscopy. You may have a sigmoidoscopy every 5 years or a colonoscopy every 10 years starting at age 76.  Hepatitis C blood test.  Hepatitis B blood test.  Sexually transmitted disease (STD) testing.  Diabetes screening. This is done by checking your blood sugar (glucose) after you have not eaten for a while (fasting). You may have this done every 1-3 years.  Bone density scan. This is done to screen for osteoporosis. You may have this done  starting at age 41.  Mammogram. This may be done every 1-2 years. Talk to your health care provider about how often you should have regular mammograms. Talk with your health care provider about your test results, treatment options, and if necessary, the need for more tests. Vaccines   Your health care provider may recommend certain vaccines, such as:  Influenza vaccine. This is recommended every year.  Tetanus, diphtheria, and acellular pertussis (Tdap, Td) vaccine. You may need a Td booster every 10 years.  Zoster vaccine. You may need this after age 44.  Pneumococcal 13-valent conjugate (PCV13) vaccine. One dose is recommended after age 57.  Pneumococcal polysaccharide (PPSV23) vaccine. One dose is recommended after age 27. Talk to your health care provider about which screenings and vaccines you need and how often you need them. This information is not intended to replace advice given to you by your health care provider. Make sure you discuss any questions you have with your health care provider. Document Released: 08/26/2015 Document Revised: 04/18/2016 Document Reviewed: 05/31/2015 Elsevier Interactive Patient Education  2017 [place] Prevention in the Home Falls can cause injuries. They can happen to people of all ages. There are many things you can do to make your home safe and to help prevent falls. What can I do on the outside of my home?  Regularly fix the edges of walkways and driveways and fix any cracks.  Remove anything that might make you trip as you walk through a door, such as a raised step or threshold.  Trim any bushes or trees on the path to your home.  Use bright outdoor lighting.  Clear any walking paths of anything that might make someone trip, such as rocks or tools.  Regularly check to see if handrails are loose or broken. Make sure that both sides of any steps have handrails.  Any raised decks and porches should have guardrails on the edges.  Have any leaves, snow, or ice cleared regularly.  Use sand or salt on walking paths during winter.  Clean up any spills in your garage right away. This includes oil or grease spills. What can I do in the bathroom?  Use night lights.  Install grab bars by the toilet and in the  tub and shower. Do not use towel bars as grab bars.  Use non-skid mats or decals in the tub or shower.  If you need to sit down in the shower, use a plastic, non-slip stool.  Keep the floor dry. Clean up any water that spills on the floor as soon as it happens.  Remove soap buildup in the tub or shower regularly.  Attach bath mats securely with double-sided non-slip rug tape.  Do not have throw rugs and other things on the floor that can make you trip. What can I do in the bedroom?  Use night lights.  Make sure that you have a light by your bed that is easy to reach.  Do not use any sheets or blankets that are too big for your bed. They should not hang down onto the floor.  Have a firm chair that has side arms. You can use this for support while you get dressed.  Do not have throw rugs and other things on the floor that can make you trip. What can I do in the kitchen?  Clean up any spills right away.  Avoid walking on wet floors.  Keep items that you use a lot in easy-to-reach places.  If you need to reach something above you, use a strong step stool that has a grab bar.  Keep electrical cords out of the way.  Do not use floor polish or wax that makes floors slippery. If you must use wax, use non-skid floor wax.  Do not have throw rugs and other things on the floor that can make you trip. What can I do with my stairs?  Do not leave any items on the stairs.  Make sure that there are handrails on both sides of the stairs and use them. Fix handrails that are broken or loose. Make sure that handrails are as long as the stairways.  Check any carpeting to make sure that it is firmly attached to the stairs. Fix any carpet that is loose or worn.  Avoid having throw rugs at the top or bottom of the stairs. If you do have throw rugs, attach them to the floor with carpet tape.  Make sure that you have a light switch at the top of the stairs and the bottom of the stairs. If you  do not have them, ask someone to add them for you. What else can I do to help prevent falls?  Wear shoes that:  Do not have high heels.  Have rubber bottoms.  Are comfortable and fit you well.  Are closed at the toe. Do not wear sandals.  If you use a stepladder:  Make sure that it is fully opened. Do not climb a closed stepladder.  Make sure that both sides of the stepladder are locked into place.  Ask someone to hold it for you, if possible.  Clearly mark and make sure that you can see:  Any grab bars or handrails.  First and last steps.  Where the edge of each step is.  Use tools that help you move around (mobility aids) if they are needed. These include:  Canes.  Walkers.  Scooters.  Crutches.  Turn on the lights when you go into a dark area. Replace any light bulbs as soon as they burn out.  Set up your furniture so you have a clear path. Avoid moving your furniture around.  If any of your floors are uneven, fix them.  If there are any pets around you, be aware of where they are.  Review your medicines with your doctor. Some medicines can make you feel dizzy. This can increase your chance of falling. Ask your doctor what other things that you can do to help prevent falls. This information is not intended to replace advice given to you by your health care provider. Make sure you discuss any questions you have with your health care provider. Document Released: 05/26/2009 Document Revised: 01/05/2016 Document Reviewed: 09/03/2014 Elsevier Interactive Patient Education  2017 [employer].

## 2019-09-10 ENCOUNTER — Encounter: Payer: Self-pay | Admitting: Family Medicine

## 2019-09-11 ENCOUNTER — Encounter: Payer: Self-pay | Admitting: Family Medicine

## 2019-09-11 ENCOUNTER — Other Ambulatory Visit: Payer: Self-pay

## 2019-09-11 ENCOUNTER — Ambulatory Visit (INDEPENDENT_AMBULATORY_CARE_PROVIDER_SITE_OTHER): Payer: Medicare PPO | Admitting: Family Medicine

## 2019-09-11 VITALS — BP 118/70 | HR 67 | Temp 96.9°F | Resp 16 | Ht 69.0 in | Wt 281.6 lb

## 2019-09-11 DIAGNOSIS — R7303 Prediabetes: Secondary | ICD-10-CM | POA: Diagnosis not present

## 2019-09-11 DIAGNOSIS — E039 Hypothyroidism, unspecified: Secondary | ICD-10-CM

## 2019-09-11 DIAGNOSIS — M170 Bilateral primary osteoarthritis of knee: Secondary | ICD-10-CM

## 2019-09-11 DIAGNOSIS — Z0000 Encounter for general adult medical examination without abnormal findings: Secondary | ICD-10-CM | POA: Diagnosis not present

## 2019-09-11 DIAGNOSIS — Z5320 Procedure and treatment not carried out because of patient's decision for unspecified reasons: Secondary | ICD-10-CM

## 2019-09-11 DIAGNOSIS — E7800 Pure hypercholesterolemia, unspecified: Secondary | ICD-10-CM | POA: Diagnosis not present

## 2019-09-11 DIAGNOSIS — I10 Essential (primary) hypertension: Secondary | ICD-10-CM | POA: Diagnosis not present

## 2019-09-11 MED ORDER — METHYLPREDNISOLONE ACETATE 40 MG/ML IJ SUSP
40.0000 mg | Freq: Once | INTRAMUSCULAR | Status: AC
  Administered 2019-09-11: 40 mg via INTRAMUSCULAR

## 2019-09-11 MED ORDER — LIDOCAINE HCL (PF) 1 % IJ SOLN
2.0000 mL | Freq: Once | INTRAMUSCULAR | Status: AC
  Administered 2019-09-11: 2 mL via INTRADERMAL

## 2019-09-11 NOTE — Assessment & Plan Note (Signed)
Previously well controlled Continue Synthroid at current dose  Recheck TSH and adjust Synthroid as indicated   

## 2019-09-11 NOTE — Assessment & Plan Note (Signed)
Stable Continue statin Recheck lipid panel and CMP

## 2019-09-11 NOTE — Patient Instructions (Addendum)
 The CDC recommends two doses of Shingrix (the shingles vaccine) separated by 2 to 6 months for adults age 75 years and older. I recommend checking with your insurance plan regarding coverage for this vaccine.     Preventive Care 65 Years and Older, Female Preventive care refers to lifestyle choices and visits with your health care provider that can promote health and wellness. This includes:  A yearly physical exam. This is also called an annual well check.  Regular dental and eye exams.  Immunizations.  Screening for certain conditions.  Healthy lifestyle choices, such as diet and exercise. What can I expect for my preventive care visit? Physical exam Your health care provider will check:  Height and weight. These may be used to calculate body mass index (BMI), which is a measurement that tells if you are at a healthy weight.  Heart rate and blood pressure.  Your skin for abnormal spots. Counseling Your health care provider may ask you questions about:  Alcohol, tobacco, and drug use.  Emotional well-being.  Home and relationship well-being.  Sexual activity.  Eating habits.  History of falls.  Memory and ability to understand (cognition).  Work and work environment.  Pregnancy and menstrual history. What immunizations do I need?  Influenza (flu) vaccine  This is recommended every year. Tetanus, diphtheria, and pertussis (Tdap) vaccine  You may need a Td booster every 10 years. Varicella (chickenpox) vaccine  You may need this vaccine if you have not already been vaccinated. Zoster (shingles) vaccine  You may need this after age 60. Pneumococcal conjugate (PCV13) vaccine  One dose is recommended after age 65. Pneumococcal polysaccharide (PPSV23) vaccine  One dose is recommended after age 65. Measles, mumps, and rubella (MMR) vaccine  You may need at least one dose of MMR if you were born in 1957 or later. You may also need a second  dose. Meningococcal conjugate (MenACWY) vaccine  You may need this if you have certain conditions. Hepatitis A vaccine  You may need this if you have certain conditions or if you travel or work in places where you may be exposed to hepatitis A. Hepatitis B vaccine  You may need this if you have certain conditions or if you travel or work in places where you may be exposed to hepatitis B. Haemophilus influenzae type b (Hib) vaccine  You may need this if you have certain conditions. You may receive vaccines as individual doses or as more than one vaccine together in one shot (combination vaccines). Talk with your health care provider about the risks and benefits of combination vaccines. What tests do I need? Blood tests  Lipid and cholesterol levels. These may be checked every 5 years, or more frequently depending on your overall health.  Hepatitis C test.  Hepatitis B test. Screening  Lung cancer screening. You may have this screening every year starting at age 55 if you have a 30-pack-year history of smoking and currently smoke or have quit within the past 15 years.  Colorectal cancer screening. All adults should have this screening starting at age 75 and continuing until age 75. Your health care provider may recommend screening at age 45 if you are at increased risk. You will have tests every 1-10 years, depending on your results and the type of screening test.  Diabetes screening. This is done by checking your blood sugar (glucose) after you have not eaten for a while (fasting). You may have this done every 1-3 years.  Mammogram. This may   be done every 1-2 years. Talk with your health care provider about how often you should have regular mammograms.  BRCA-related cancer screening. This may be done if you have a family history of breast, ovarian, tubal, or peritoneal cancers. Other tests  Sexually transmitted disease (STD) testing.  Bone density scan. This is done to screen for  osteoporosis. You may have this done starting at age 65. Follow these instructions at home: Eating and drinking  Eat a diet that includes fresh fruits and vegetables, whole grains, lean protein, and low-fat dairy products. Limit your intake of foods with high amounts of sugar, saturated fats, and salt.  Take vitamin and mineral supplements as recommended by your health care provider.  Do not drink alcohol if your health care provider tells you not to drink.  If you drink alcohol: ? Limit how much you have to 0-1 drink a day. ? Be aware of how much alcohol is in your drink. In the [place] one drink equals one 12 oz bottle of beer (355 mL), one 5 oz glass of wine (148 mL), or one 1 oz glass of hard liquor (44 mL). Lifestyle  Take daily care of your teeth and gums.  Stay active. Exercise for at least 30 minutes on 5 or more days each week.  Do not use any products that contain nicotine or tobacco, such as cigarettes, e-cigarettes, and chewing tobacco. If you need help quitting, ask your health care provider.  If you are sexually active, practice safe sex. Use a condom or other form of protection in order to prevent STIs (sexually transmitted infections).  Talk with your health care provider about taking a low-dose aspirin or statin. What's next?  Go to your health care provider once a year for a well check visit.  Ask your health care provider how often you should have your eyes and teeth checked.  Stay up to date on all vaccines. This information is not intended to replace advice given to you by your health care provider. Make sure you discuss any questions you have with your health care provider. Document Revised: 07/24/2018 Document Reviewed: 07/24/2018 Elsevier Patient Education  2020 Elsevier Inc.  

## 2019-09-11 NOTE — Assessment & Plan Note (Signed)
Well controlled Continue current medications Recheck metabolic panel F/u in 6 months  

## 2019-09-11 NOTE — Assessment & Plan Note (Signed)
OA of bilateral knees, T/L spine Repeat steroid injections in bilateral knees again today If helping, will continue no more than q3 months If no longer helping, will need Ortho referral Not sure if she'd be a candidate to try Synvisc again Not currently a candidate for TKA given morbid obesity Continue voltaren gel prn Continue scheduled tylenol and gabapentin qhs

## 2019-09-11 NOTE — Assessment & Plan Note (Signed)
Encourage low carb diet Recheck A1c 

## 2019-09-11 NOTE — Assessment & Plan Note (Signed)
Discussed importance of healthy weight management Discussed diet and exercise  Has lost weight over last several months related to decreased intake due to illness and death of her husband Will continue to monitor

## 2019-09-11 NOTE — Progress Notes (Signed)
Patient: Tara Potter, Female    DOB: 21-Nov-1945, 74 y.o.   MRN: [id number] Visit Date: 09/11/2019  Today's Provider: Shirlee Latch, MD   Chief Complaint  Patient presents with  . Annual Exam   Subjective:     Complete Physical Tara Potter is a 74 y.o. female. She feels well. She reports exercising no. She reports she is sleeping well.  BP Readings from Last 3 Encounters:  09/11/19 118/70  04/06/19 136/70  01/01/19 128/80   Wt Readings from Last 3 Encounters:  09/11/19 281 lb 9.6 oz (127.7 kg)  04/06/19 (!) 312 lb (141.5 kg)  01/01/19 (!) 317 lb 9.6 oz (144.1 kg)    -----------------------------------------------------------   Review of Systems  Constitutional: Negative.   HENT: Negative.   Eyes: Negative.   Respiratory: Negative.   Cardiovascular: Negative.   Gastrointestinal: Negative.   Endocrine: Negative.   Genitourinary: Negative.   Musculoskeletal: Positive for arthralgias and back pain.  Skin: Negative.   Allergic/Immunologic: Negative.   Neurological: Negative.   Hematological: Negative.   Psychiatric/Behavioral: Negative.     Social History   Socioeconomic History  . Marital status: Widowed    Spouse name: Not on file  . Number of children: 2  . Years of education: Not on file  . Highest education level: Bachelor's degree (e.g., BA, AB, BS)  Occupational History  . Occupation: retired  Tobacco Use  . Smoking status: Never Smoker  . Smokeless tobacco: Never Used  Substance and Sexual Activity  . Alcohol use: Not Currently  . Drug use: No  . Sexual activity: Not on file  Other Topics Concern  . Not on file  Social History Narrative  . Not on file   Social Determinants of Health   Financial Resource Strain: Low Risk   . Difficulty of Paying Living Expenses: Not hard at all  Food Insecurity: No Food Insecurity  . Worried About Programme researcher, broadcasting/film/video in the Last Year: Never true  . Ran Out of Food in the Last Year: Never true    Transportation Needs: No Transportation Needs  . Lack of Transportation (Medical): No  . Lack of Transportation (Non-Medical): No  Physical Activity: Inactive  . Days of Exercise per Week: 0 days  . Minutes of Exercise per Session: 0 min  Stress: Stress Concern Present  . Feeling of Stress : To some extent  Social Connections: Moderately Isolated  . Frequency of Communication with Friends and Family: More than three times a week  . Frequency of Social Gatherings with Friends and Family: More than three times a week  . Attends Religious Services: Never  . Active Member of Clubs or Organizations: No  . Attends Banker Meetings: Never  . Marital Status: Widowed  Intimate Partner Violence: Not At Risk  . Fear of Current or Ex-Partner: No  . Emotionally Abused: No  . Physically Abused: No  . Sexually Abused: No    Past Medical History:  Diagnosis Date  . Arthritis   . Hyperlipidemia   . Hypertension      Patient Active Problem List   Diagnosis Date Noted  . Sciatica of left side 11/19/2018  . DJD (degenerative joint disease), lumbar 07/15/2017  . Bursitis of right shoulder 04/11/2017  . Morbid obesity (HCC) 04/11/2017  . Healthcare maintenance 04/11/2017  . Prediabetes 03/14/2016  . Arthritis, degenerative 09/29/2008  . Essential (primary) hypertension 02/25/2008  . Cardiac murmur 01/29/2008  . HLD (hyperlipidemia) 08/13/1998  .  Adult hypothyroidism 08/13/1998  . Hay fever 08/13/1998    Past Surgical History:  Procedure Laterality Date  . DILATION AND CURETTAGE OF UTERUS    . TUBAL LIGATION      Her family history includes Arthritis in her sister; Atrial fibrillation in her mother; Heart disease in her father; Hypertension in her mother; Kidney disease in her mother; Macular degeneration in her mother. There is no history of Breast cancer.   Current Outpatient Medications:  .  acetaminophen (TYLENOL) 650 MG CR tablet, Take 650 mg by mouth every 8  (eight) hours as needed for pain., Disp: , Rfl:  .  aspirin EC 81 MG tablet, Take 81 mg by mouth daily. , Disp: , Rfl:  .  atorvastatin (LIPITOR) 10 MG tablet, Take 1 tablet (10 mg total) by mouth daily., Disp: 90 tablet, Rfl: 3 .  bisoprolol-hydrochlorothiazide (ZIAC) 5-6.25 MG tablet, Take 1 tablet by mouth once daily, Disp: 90 tablet, Rfl: 0 .  diclofenac (VOLTAREN) 75 MG EC tablet, Take 1 tablet by mouth twice daily, Disp: 180 tablet, Rfl: 0 .  EUTHYROX 75 MCG tablet, Take 1 tablet by mouth once daily, Disp: 90 tablet, Rfl: 3 .  fluticasone (FLONASE) 50 MCG/ACT nasal spray, Place 2 sprays into both nostrils daily., Disp: 16 g, Rfl: 11 .  gabapentin (NEURONTIN) 100 MG capsule, TAKE 1 CAPSULE BY MOUTH THREE TIMES A DAY (Patient taking differently: Take 100 mg by mouth at bedtime. ), Disp: 90 capsule, Rfl: 3 .  Multiple Vitamins-Minerals (MULTIVITAL PO), Take by mouth daily. When remembers, Disp: , Rfl:   Patient Care Team: Virginia Crews, MD as PCP - General (Family Medicine) Dingeldein, Remo Lipps, MD as Consulting Physician (Ophthalmology)     Objective:    Vitals: BP 118/70 (BP Location: Left Arm, Patient Position: Sitting, Cuff Size: Large)   Pulse 67   Temp (!) 96.9 F (36.1 C) (Temporal)   Resp 16   Ht 5\' 9"  (1.753 m)   Wt 281 lb 9.6 oz (127.7 kg)   BMI 41.59 kg/m   Physical Exam Vitals reviewed.  Constitutional:      General: She is not in acute distress.    Appearance: Normal appearance. She is well-developed. She is not diaphoretic.  HENT:     Head: Normocephalic and atraumatic.     Right Ear: External ear normal.     Left Ear: External ear normal.  Eyes:     General: No scleral icterus.    Conjunctiva/sclera: Conjunctivae normal.     Pupils: Pupils are equal, round, and reactive to light.  Neck:     Thyroid: No thyromegaly.  Cardiovascular:     Rate and Rhythm: Normal rate and regular rhythm.     Pulses: Normal pulses.     Heart sounds: Normal heart sounds.  No murmur.  Pulmonary:     Effort: Pulmonary effort is normal. No respiratory distress.     Breath sounds: Normal breath sounds. No wheezing or rales.  Abdominal:     General: There is no distension.     Palpations: Abdomen is soft.     Tenderness: There is no abdominal tenderness.  Musculoskeletal:        General: No deformity.     Cervical back: Neck supple.     Right lower leg: No edema.     Left lower leg: No edema.     Comments: Bilateral knees with bossing, stiffness  Lymphadenopathy:     Cervical: No cervical adenopathy.  Skin:  General: Skin is warm and dry.     Capillary Refill: Capillary refill takes less than 2 seconds.     Findings: No rash.  Neurological:     Mental Status: She is alert and oriented to person, place, and time. Mental status is at baseline.  Psychiatric:        Mood and Affect: Mood normal.        Behavior: Behavior normal.        Thought Content: Thought content normal.     Activities of Daily Living In your present state of health, do you have any difficulty performing the following activities: 09/02/2019  Hearing? Y  Comment Has hearing loss, is currently waiting on hearing aids.  Vision? N  Difficulty concentrating or making decisions? N  Walking or climbing stairs? N  Dressing or bathing? N  Doing errands, shopping? N  Preparing Food and eating ? N  Using the Toilet? N  In the past six months, have you accidently leaked urine? Y  Comment Occasionally with urges.  Do you have problems with loss of bowel control? N  Managing your Medications? N  Managing your Finances? N  Housekeeping or managing your Housekeeping? N  Some recent data might be hidden    Fall Risk Assessment Fall Risk  09/02/2019 04/21/2018 04/11/2017 03/12/2016  Falls in the past year? 0 Yes No No  Number falls in past yr: 0 1 - -  Injury with Fall? 0 No - -  Follow up - Falls prevention discussed - -     Depression Screen PHQ 2/9 Scores 09/02/2019 09/02/2019  04/21/2018 04/21/2018  PHQ - 2 Score 1 1 1 1   PHQ- 9 Score - - 3 -    6CIT Screen 09/11/2019  What Year? 0 points  What month? 0 points  What time? 0 points  Count back from 20 0 points  Months in reverse 0 points  Repeat phrase 2 points  Total Score 2   Audit-C Alcohol Use Screening   Alcohol Use Disorder Test (AUDIT) 09/02/2019  1. How often do you have a drink containing alcohol? 0  2. How many drinks containing alcohol do you have on a typical day when you are drinking? 0  3. How often do you have six or more drinks on one occasion? 0  AUDIT-C Score 0    A score of 3 or more in women, and 4 or more in men indicates increased risk for alcohol abuse, EXCEPT if all of the points are from question 1     Assessment & Plan:    Annual Physical Reviewed patient's Family Medical History Reviewed and updated list of patient's medical providers Assessment of cognitive impairment was done Assessed patient's functional ability Established a written schedule for health screening services Health Risk Assessent Completed and Reviewed  Exercise Activities and Dietary recommendations Goals    . Reduce sugar in daily diet     Recommend cutting out all extra sugars in daily diet, no more sweets.        Immunization History  Administered Date(s) Administered  . Fluad Quad(high Dose 65+) 04/06/2019  . Influenza, High Dose Seasonal PF 06/22/2015, 06/07/2016, 04/11/2017, 04/28/2018  . Pneumococcal Conjugate-13 04/11/2017  . Pneumococcal Polysaccharide-23 05/18/2013  . Td 12/29/2004    Health Maintenance  Topic Date Due  . TETANUS/TDAP  12/30/2014  . COLON CANCER SCREENING ANNUAL FOBT  06/06/2019  . MAMMOGRAM  07/10/2019  . COLONOSCOPY  09/01/2020 (Originally 01/12/1995)  . DEXA SCAN  08/13/2026 (Originally 01/11/2010)  . INFLUENZA VACCINE  Completed  . Hepatitis C Screening  Completed  . PNA vac Low Risk Adult  Completed     Discussed health benefits of physical activity, and  encouraged her to engage in regular exercise appropriate for her age and condition.    ------------------------------------------------------------------------------------------------------------  Problem List Items Addressed This Visit      Cardiovascular and Mediastinum   Essential (primary) hypertension    Well controlled Continue current medications Recheck metabolic panel F/u in 6 months       Relevant Orders   Comprehensive metabolic panel     Endocrine   Adult hypothyroidism    Previously well controlled Continue Synthroid at current dose  Recheck TSH and adjust Synthroid as indicated       Relevant Orders   TSH     Musculoskeletal and Integument   Arthritis, degenerative    OA of bilateral knees, T/L spine Repeat steroid injections in bilateral knees again today If helping, will continue no more than q3 months If no longer helping, will need Ortho referral Not sure if she'd be a candidate to try Synvisc again Not currently a candidate for TKA given morbid obesity Continue voltaren gel prn Continue scheduled tylenol and gabapentin qhs        Other   HLD (hyperlipidemia)    Stable Continue statin Recheck lipid panel and CMP      Relevant Orders   Comprehensive metabolic panel   Lipid panel   Prediabetes    Encourage low carb diet Recheck A1c      Relevant Orders   Hemoglobin A1c   Morbid obesity (HCC)    Discussed importance of healthy weight management Discussed diet and exercise  Has lost weight over last several months related to decreased intake due to illness and death of her husband Will continue to monitor      Relevant Orders   TSH   Hemoglobin A1c   Comprehensive metabolic panel   Lipid panel    Other Visit Diagnoses    Encounter for annual physical exam    -  Primary   Relevant Orders   TSH   Hemoglobin A1c   Comprehensive metabolic panel   Lipid panel   Colon cancer screening declined        - decliens any colon cancer  screening at this time due to all of the other things she has going on - will discuss cologuard again at next f/u appt   INJECTION: Patient was given informed consent,. Appropriate time out was taken. Area prepped and draped in usual sterile fashion. 1 cc of depo-medrol 40 mg/ml plus  4 cc of 1% lidocaine without epinephrine was injected into each of the bilateral knees using a(n) anterolateral approach. The patient tolerated the procedure well. There were no complications. Post procedure instructions were given.    Return in about 6 months (around 03/10/2020) for chronic disease f/u.   The entirety of the information documented in the History of Present Illness, Review of Systems and Physical Exam were personally obtained by me. Portions of this information were initially documented by Rondel Baton, CMA and reviewed by me for thoroughness and accuracy.    Codi Folkerts, Marzella Schlein, MD MPH [facility]

## 2019-09-11 NOTE — Addendum Note (Signed)
Addended by: Hyacinth Meeker on: 09/11/2019 01:26 PM   Modules accepted: Orders

## 2019-09-12 LAB — COMPREHENSIVE METABOLIC PANEL
ALT: 9 IU/L (ref 0–32)
AST: 13 IU/L (ref 0–40)
Albumin/Globulin Ratio: 1.5 (ref 1.2–2.2)
Albumin: 4 g/dL (ref 3.7–4.7)
Alkaline Phosphatase: 141 IU/L — ABNORMAL HIGH (ref 39–117)
BUN/Creatinine Ratio: 22 (ref 12–28)
BUN: 22 mg/dL (ref 8–27)
Bilirubin Total: 0.4 mg/dL (ref 0.0–1.2)
CO2: 24 mmol/L (ref 20–29)
Calcium: 10 mg/dL (ref 8.7–10.3)
Chloride: 101 mmol/L (ref 96–106)
Creatinine, Ser: 1.01 mg/dL — ABNORMAL HIGH (ref 0.57–1.00)
GFR calc Af Amer: 63 mL/min/{1.73_m2} (ref >59)
GFR calc non Af Amer: 55 mL/min/{1.73_m2} — ABNORMAL LOW (ref >59)
Globulin, Total: 2.7 g/dL (ref 1.5–4.5)
Glucose: 86 mg/dL (ref 65–99)
Potassium: 4.2 mmol/L (ref 3.5–5.2)
Sodium: 141 mmol/L (ref 134–144)
Total Protein: 6.7 g/dL (ref 6.0–8.5)

## 2019-09-12 LAB — HEMOGLOBIN A1C
Est. average glucose Bld gHb Est-mCnc: 111 mg/dL
Hgb A1c MFr Bld: 5.5 % (ref 4.8–5.6)

## 2019-09-12 LAB — LIPID PANEL
Chol/HDL Ratio: 4.3 ratio (ref 0.0–4.4)
Cholesterol, Total: 180 mg/dL (ref 100–199)
HDL: 42 mg/dL (ref >39)
LDL Chol Calc (NIH): 114 mg/dL — ABNORMAL HIGH (ref 0–99)
Triglycerides: 137 mg/dL (ref 0–149)
VLDL Cholesterol Cal: 24 mg/dL (ref 5–40)

## 2019-09-12 LAB — TSH: TSH: 2.25 u[IU]/mL (ref 0.450–4.500)

## 2019-09-14 ENCOUNTER — Telehealth: Payer: Self-pay

## 2019-09-14 NOTE — Telephone Encounter (Signed)
Comment seen by patient Tara Potter on 09/14/2019 12:03 PM EST

## 2019-09-14 NOTE — Telephone Encounter (Signed)
Attempted to call Patient.   No ans.  No voice mail  Set up.

## 2019-09-14 NOTE — Telephone Encounter (Signed)
-----  Message from Virginia Crews, MD sent at 09/14/2019 11:31 AM EST ----- Normal labs, except slightly elevated Alk Phos (will recheck at next visit, but let us know if she develops any right upper quadrant pain).  Cholesterol is not to goal.  Taking atorvastatin regularly?  If not, she should, and if she is already, would increase dose to '20mg'$  daily.

## 2019-09-14 NOTE — Telephone Encounter (Signed)
Tried calling; pt's voicemail box is full.  PEC please advise pt below if she calls back.  Thanks,   -Alysson Geist  

## 2019-09-14 NOTE — Telephone Encounter (Signed)
Patient reports that she is taking atorvastatin daily. Patient refuses to increase medication for now.

## 2019-09-22 ENCOUNTER — Other Ambulatory Visit: Payer: Self-pay | Admitting: Family Medicine

## 2019-09-22 IMAGING — CR DG KNEE STANDING AP BILAT
1 series · 1 of 1 positions shown · non-contrast
Comparison: None in PACs

CLINICAL DATA: Bilateral knee pain for the past 45 years. Symptoms
greatest on the right.

EXAM:
BILATERAL KNEES STANDING - 1 VIEW

[dg knee bilateral standing ap]
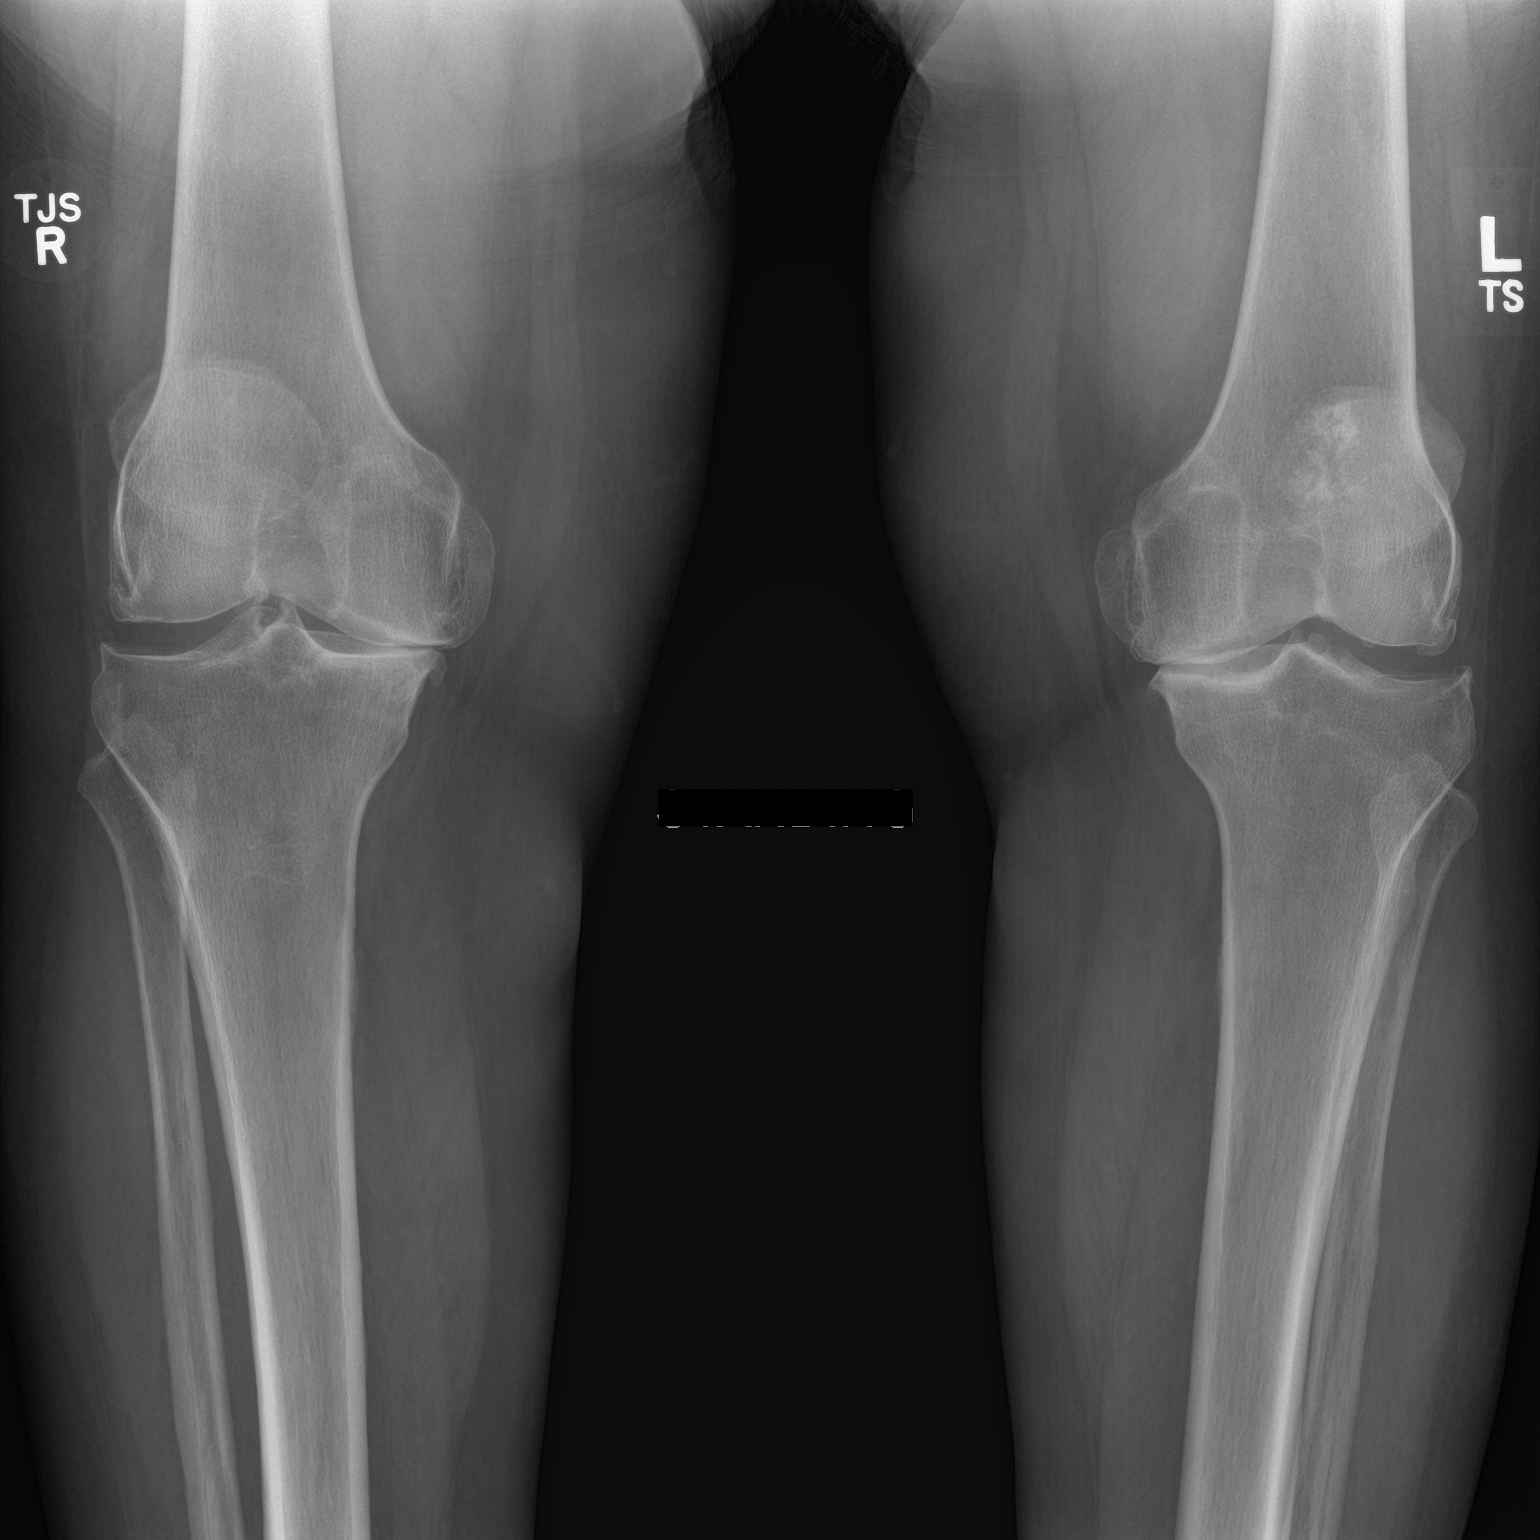

[1 of 1 positions shown; findings below may reference images not displayed]

FINDINGS: AP standing views of both knees are reviewed.

Right knee: There is moderate to severe joint space loss of the
medial compartment. There is slight subluxation of the tibia
laterally with respect to the femoral condyles. Spurs arise from the
articular margins of the tibial plateaus, the lateral femoral
condyle, and from the tibial spines.

Left knee: There is moderate joint space loss of the medial
compartment. Small spurs arise from the margins of the tibial
plateaus and from the articular margin of the lateral femoral
condyle. There is beaking of the tibial spines.
IMPRESSION: Osteoarthritic changes of both knees greatest on the right where
there is moderate to severe joint space loss medially. On the left
the degree of joint space loss medially is only moderate.

## 2019-09-22 NOTE — Telephone Encounter (Signed)
Requested Prescriptions  Pending Prescriptions Disp Refills  . diclofenac (VOLTAREN) 75 MG EC tablet [Pharmacy Med Name: Diclofenac Sodium 75 MG Oral Tablet Delayed Release] 180 tablet 0    Sig: Take 1 tablet by mouth twice daily     Analgesics:  NSAIDS Failed - 09/22/2019 10:52 AM      Failed - Cr in normal range and within 360 days    Creatinine, Ser  Date Value Ref Range Status  09/11/2019 1.01 (H) 0.57 - 1.00 mg/dL Final         Failed - HGB in normal range and within 360 days    Hemoglobin  Date Value Ref Range Status  04/30/2018 14.4 11.1 - 15.9 g/dL Final         Passed - Patient is not pregnant      Passed - Valid encounter within last 12 months    Recent Outpatient Visits          1 week ago Encounter for annual physical exam   [facility], Marzella Schlein, MD   2 months ago Dysuria   [facility], Marzella Schlein, MD   5 months ago Essential (primary) hypertension   [facility], Marzella Schlein, MD   8 months ago Primary osteoarthritis of both knees   [facility] [place], Marzella Schlein, MD   9 months ago Urgency of urination   [facility] [place], Lavella [place], [place]      Future Appointments            In 2 months Bacigalupo, Marzella Schlein, MD [facility], [facility]   In 5 months Bacigalupo, Marzella Schlein, MD [facility], [facility]

## 2019-10-05 ENCOUNTER — Other Ambulatory Visit: Payer: Self-pay | Admitting: Family Medicine

## 2019-10-05 NOTE — Telephone Encounter (Signed)
Requested medications are due for refill today?  Prescription expired on 10/14/2018  Requested medications are on active medication list?  Yes  Last Refill:  10/14/2017 16 g with 11 refills.    Future visit scheduled?  Yes  Notes to Clinic:  Prescription expired on 10/14/2018.

## 2019-11-09 ENCOUNTER — Ambulatory Visit: Payer: Medicare PPO | Admitting: Family Medicine

## 2019-11-09 ENCOUNTER — Encounter: Payer: Self-pay | Admitting: Family Medicine

## 2019-11-09 ENCOUNTER — Other Ambulatory Visit: Payer: Self-pay

## 2019-11-09 VITALS — BP 115/74 | HR 74 | Temp 96.8°F | Wt 278.0 lb

## 2019-11-09 DIAGNOSIS — K219 Gastro-esophageal reflux disease without esophagitis: Secondary | ICD-10-CM

## 2019-11-09 DIAGNOSIS — L723 Sebaceous cyst: Secondary | ICD-10-CM

## 2019-11-09 MED ORDER — FAMOTIDINE 20 MG PO TABS: 20.0000 mg | ORAL_TABLET | Freq: Two times a day (BID) | ORAL | 0 refills | Status: DC

## 2019-11-09 NOTE — Patient Instructions (Signed)

## 2019-11-09 NOTE — Progress Notes (Signed)
Patient: Tara Potter Female    DOB: [birth year]   74 y.o.   MRN: [id number] Visit Date: 11/09/2019  Today's Provider: Shirlee Latch, MD   Chief Complaint  Patient presents with  . Abdominal Pain    Upper right sided abdominal pain, comes and goes.   . Cyst    right back   Subjective:     Abdominal Pain This is a new problem. Episode onset: Started about two weeks ago. The problem has been waxing and waning. The pain is located in the RUQ. Quality: Stabbing  The abdominal pain does not radiate. Associated symptoms include belching, flatus and nausea. Pertinent negatives include no constipation, diarrhea, dysuria, fever, frequency or vomiting. Exacerbated by: Hunger, empty stomach. The pain is relieved by eating and passing flatus. Treatments tried: Maloxx. The treatment provided mild relief.  Tums helps (alka seltzer brand)/. Maloxx works well. Feels some acid with burping.  No change with positioning  Sebaceous cyst on R mid back around bra line  Allergies  Allergen Reactions  . Amoxicillin Diarrhea    Augmentin  . Entex T [Pseudoephedrine-Guaifenesin]     keeps awake; pt states she still takes this  . Erythromycin Diarrhea     Current Outpatient Medications:  .  acetaminophen (TYLENOL) 650 MG CR tablet, Take 650 mg by mouth every 8 (eight) hours as needed for pain., Disp: , Rfl:  .  aspirin EC 81 MG tablet, Take 81 mg by mouth daily. , Disp: , Rfl:  .  atorvastatin (LIPITOR) 10 MG tablet, Take 1 tablet (10 mg total) by mouth daily., Disp: 90 tablet, Rfl: 3 .  bisoprolol-hydrochlorothiazide (ZIAC) 5-6.25 MG tablet, Take 1 tablet by mouth once daily, Disp: 90 tablet, Rfl: 0 .  diclofenac (VOLTAREN) 75 MG EC tablet, Take 1 tablet by mouth twice daily, Disp: 180 tablet, Rfl: 0 .  diclofenac Sodium (VOLTAREN) 1 % GEL, Apply topically 4 (four) times daily., Disp: , Rfl:  .  EUTHYROX 75 MCG tablet, Take 1 tablet by mouth once daily, Disp: 90 tablet, Rfl: 3 .   fluticasone (FLONASE) 50 MCG/ACT nasal spray, Use 2 spray(s) in each nostril once daily, Disp: 48 g, Rfl: 6 .  gabapentin (NEURONTIN) 100 MG capsule, TAKE 1 CAPSULE BY MOUTH THREE TIMES A DAY (Patient taking differently: Take 100 mg by mouth at bedtime. ), Disp: 90 capsule, Rfl: 3 .  Multiple Vitamins-Minerals (MULTIVITAL PO), Take by mouth daily. When remembers, Disp: , Rfl:   Review of Systems  Constitutional: Negative.  Negative for fever.  Gastrointestinal: Positive for abdominal distention (occasionlly), abdominal pain, flatus and nausea. Negative for anal bleeding, blood in stool, constipation, diarrhea, rectal pain and vomiting.       Also trouble with reflux   Genitourinary: Negative for dysuria and frequency.  Psychiatric/Behavioral: The patient is nervous/anxious.     Social History   Tobacco Use  . Smoking status: Never Smoker  . Smokeless tobacco: Never Used  Substance Use Topics  . Alcohol use: Not Currently      Objective:   BP 115/74 (BP Location: Right Wrist, Patient Position: Sitting, Cuff Size: Normal)   Pulse 74   Temp (!) 96.8 F (36 C) (Temporal)   Wt 278 lb (126.1 kg)   BMI 41.05 kg/m  Vitals:   11/09/19 1437  BP: 115/74  Pulse: 74  Temp: (!) 96.8 F (36 C)  TempSrc: Temporal  Weight: 278 lb (126.1 kg)  Body mass index is 41.05 kg/m.  Physical Exam Vitals reviewed.  Constitutional:      General: She is not in acute distress.    Appearance: She is well-developed.  HENT:     Head: Normocephalic and atraumatic.  Eyes:     General: No scleral icterus.    Conjunctiva/sclera: Conjunctivae normal.  Cardiovascular:     Rate and Rhythm: Normal rate and regular rhythm.     Heart sounds: Normal heart sounds.  Pulmonary:     Effort: Pulmonary effort is normal. No respiratory distress.     Breath sounds: Normal breath sounds. No wheezing.  Abdominal:     General: Bowel sounds are normal. There is no distension.     Palpations: Abdomen is soft.      Tenderness: There is no abdominal tenderness. There is no right CVA tenderness, left CVA tenderness, guarding or rebound. Negative signs include Murphy's sign.  Skin:    General: Skin is warm and dry.     Capillary Refill: Capillary refill takes less than 2 seconds.     Findings: No rash.     Comments: Sebaceous cyst of R thoracic back  Neurological:     Mental Status: She is alert and oriented to person, place, and time.  Psychiatric:        Behavior: Behavior normal.      No results found for any visits on 11/09/19.     Assessment & Plan    1. Gastroesophageal reflux disease, unspecified whether esophagitis present - history and exam c/w GERD - discussed dietary precautions - benign exam and no red flags - start pepcid BID which has helped previously - can continue OTC remedies as well - may need to cut back on NSAIDs - return precautions discussed  2. Sebaceous cyst - ongoing issue - needs to have excision with Derm - she will call and make appt   Meds ordered this encounter  Medications  . famotidine (PEPCID) 20 MG tablet    Sig: Take 1 tablet (20 mg total) by mouth 2 (two) times daily.    Dispense:  60 tablet    Refill:  0     Return if symptoms worsen or fail to improve.   The entirety of the information documented in the History of Present Illness, Review of Systems and Physical Exam were personally obtained by me. Portions of this information were initially documented by Ashley Royalty, CMA and reviewed by me for thoroughness and accuracy.    Jerris Fleer, Dionne Bucy, MD MPH [facility]

## 2019-11-17 ENCOUNTER — Encounter: Payer: Self-pay | Admitting: Adult Health

## 2019-11-17 ENCOUNTER — Telehealth: Payer: Self-pay | Admitting: Family Medicine

## 2019-11-17 ENCOUNTER — Ambulatory Visit (INDEPENDENT_AMBULATORY_CARE_PROVIDER_SITE_OTHER): Payer: Medicare PPO | Admitting: Adult Health

## 2019-11-17 DIAGNOSIS — R350 Frequency of micturition: Secondary | ICD-10-CM

## 2019-11-17 DIAGNOSIS — R300 Dysuria: Secondary | ICD-10-CM

## 2019-11-17 MED ORDER — CEPHALEXIN 500 MG PO CAPS: 500.0000 mg | ORAL_CAPSULE | Freq: Three times a day (TID) | ORAL | 0 refills | Status: DC

## 2019-11-17 NOTE — Telephone Encounter (Signed)
Pt  Has called in from out of town, and states she needs Dr B to send her medication for a UTI with Frequented urination and burning. Pt was told that she needed to come in the office to be seen but she was adamantly insistent. She states Dr B has done before and she has history and would have to cut her trip with grandchildren short. She asked me to at least let Dr B know this. She can only be contacted at [phone]. She would like something called in to [facility] on  [address]  In [place].

## 2019-11-17 NOTE — Progress Notes (Signed)
Virtual telephone visit      Virtual Visit via Telephone Note   This visit type was conducted due to national recommendations for restrictions regarding the COVID-19 Pandemic (e.g. social distancing) in an effort to limit this patient's exposure and mitigate transmission in our community. Due to her co-morbid illnesses, this patient is at least at moderate risk for complications without adequate follow up. This format is felt to be most appropriate for this patient at this time. The patient did not have access to video technology or had technical difficulties with video requiring transitioning to audio format only (telephone). Physical exam was limited to content and character of the telephone converstion.    Patient location: at home Provider location: Provider: Provider's office at  [facility], [place].       Patient: Tara Potter   DOB: 10-Feb-1946   74 y.o. Female  MRN: [id number] Visit Date: 11/17/2019  Today's Provider: Jairo Ben, FNP  Subjective:    Chief Complaint  Patient presents with  . Dysuria   Dysuria  This is a new problem. The current episode started in the past 7 days. The problem occurs intermittently. The problem has been rapidly improving. The quality of the pain is described as burning. There has been no fever. Associated symptoms include frequency and urgency. Pertinent negatives include no chills, discharge, flank pain, hematuria, hesitancy, nausea or vomiting. She has tried home medications and increased fluids (azo) for the symptoms. Her past medical history is significant for recurrent UTIs.   She reports this time with urinary symptoms is not as worse as she has had in the past with her typical urinary tract infection.  Her last UTI was seen virtually on 06/2019 and she was treated with Keflex 500 mg BID x 5 days, she reports she started getting better on last day of treatment and symptoms resolved. She has intolerance to  Amoxicillin with diarrhea, however did well on Keflex. Patient prefers Cipro - however with black box warning will avoid unless necessary.   She is in [place] visiting her son out of town. She is unable to come to the office today for recommended urine check.   Patient  denies any fever, body aches,chills, rash, chest pain, shortness of breath, nausea, vomiting, or diarrhea.  Denies any trauma or injury. Denies any recent illness.  She has no other concerns at today's visit.    She saw her  PCP on 11/09/2019 Vella Kohler, MD, MPH, and was placed on Famotidine 20 mg oral BID. The medication is working well for her. She has no diarrhea any longer, she feels she could have transferred stool during her bouts of diarrhea and feels this may have caused her urinary tract infection. She denies back pain or abdominal pain. Denies hematuria. Onset 11/15/2019 she has taken one AZO tablet  a day with some relief.      Patient Active Problem List   Diagnosis Date Noted  . Sciatica of left side 11/19/2018  . DJD (degenerative joint disease), lumbar 07/15/2017  . Bursitis of right shoulder 04/11/2017  . Morbid obesity (HCC) 04/11/2017  . Healthcare maintenance 04/11/2017  . Prediabetes 03/14/2016  . Arthritis, degenerative 09/29/2008  . Essential (primary) hypertension 02/25/2008  . Cardiac murmur 01/29/2008  . HLD (hyperlipidemia) 08/13/1998  . Adult hypothyroidism 08/13/1998  . Hay fever 08/13/1998      Medications: Outpatient Medications Prior to Visit  Medication Sig  . acetaminophen (TYLENOL) 650 MG CR tablet Take 650  mg by mouth every 8 (eight) hours as needed for pain.  Marland Kitchen aspirin EC 81 MG tablet Take 81 mg by mouth daily.   Marland Kitchen atorvastatin (LIPITOR) 10 MG tablet Take 1 tablet (10 mg total) by mouth daily.  . bisoprolol-hydrochlorothiazide (ZIAC) 5-6.25 MG tablet Take 1 tablet by mouth once daily  . diclofenac (VOLTAREN) 75 MG EC tablet Take 1 tablet by mouth twice daily  . diclofenac  Sodium (VOLTAREN) 1 % GEL Apply topically 4 (four) times daily.  Janann August 75 MCG tablet Take 1 tablet by mouth once daily  . famotidine (PEPCID) 20 MG tablet Take 1 tablet (20 mg total) by mouth 2 (two) times daily.  . fluticasone (FLONASE) 50 MCG/ACT nasal spray Use 2 spray(s) in each nostril once daily  . gabapentin (NEURONTIN) 100 MG capsule TAKE 1 CAPSULE BY MOUTH THREE TIMES A DAY (Patient taking differently: Take 100 mg by mouth at bedtime. )  . Multiple Vitamins-Minerals (MULTIVITAL PO) Take by mouth daily. When remembers   No facility-administered medications prior to visit.    Review of Systems  Constitutional: Negative for activity change, appetite change, chills, diaphoresis, fatigue, fever and unexpected weight change.  Respiratory: Negative.   Cardiovascular: Negative.   Gastrointestinal: Negative for abdominal distention, abdominal pain, constipation, diarrhea, nausea and vomiting.  Genitourinary: Positive for dysuria, frequency and urgency. Negative for decreased urine volume, difficulty urinating, enuresis, flank pain, genital sores, hematuria, hesitancy, menstrual problem, pelvic pain, vaginal bleeding, vaginal discharge and vaginal pain.  Musculoskeletal: Negative for arthralgias and back pain.  Neurological: Negative for dizziness, speech difficulty, weakness and light-headedness.  Psychiatric/Behavioral: Negative for agitation, behavioral problems and decreased concentration.    Last CBC Lab Results  Component Value Date   WBC 10.2 04/30/2018   HGB 14.4 04/30/2018   HCT 41.9 04/30/2018   MCV 88 04/30/2018   MCH 30.2 04/30/2018   RDW 14.1 04/30/2018   PLT 312 04/30/2018   Last metabolic panel Lab Results  Component Value Date   GLUCOSE 86 09/11/2019   NA 141 09/11/2019   K 4.2 09/11/2019   CL 101 09/11/2019   CO2 24 09/11/2019   BUN 22 09/11/2019   CREATININE 1.01 (H) 09/11/2019   GFRNONAA 55 (L) 09/11/2019   GFRAA 63 09/11/2019   CALCIUM 10.0  09/11/2019   PROT 6.7 09/11/2019   ALBUMIN 4.0 09/11/2019   LABGLOB 2.7 09/11/2019   AGRATIO 1.5 09/11/2019   BILITOT 0.4 09/11/2019   ALKPHOS 141 (H) 09/11/2019   AST 13 09/11/2019   ALT 9 09/11/2019        Objective:   No vital signs available for telephone visit, no video capabilities. Patient denies any fever or chills.  There were no vitals taken for this visit. BP Readings from Last 3 Encounters:  11/09/19 115/74  09/11/19 118/70  04/06/19 136/70      Patient is alert and oriented and responsive to questions Engages in conversation with provider. Speaks in full sentences without any pauses without any shortness of breath or distress.        Assessment & Plan:     The primary encounter diagnosis was Dysuria. A diagnosis of Urinary frequency was also pertinent to this visit. Meds ordered this encounter  Medications  . cephALEXin (KEFLEX) 500 MG capsule    Sig: Take 1 capsule (500 mg total) by mouth 3 (three) times daily.    Dispense:  15 capsule    Refill:  0  Will dose Keflex TID as above x 5  days given patient's reported slow response to [place] November.     Discussed hydration with clear liquids. Discontinuing Azo. Discussed need for urine test, however she is unable to be seen in office she is at her sons house in [place]. She is advised if symptoms not improving in 3 to 5 days or if any symptoms persisting or worsening she should be seen in person immediately while out of town.   Advised patient call the office or your primary care doctor for an appointment if no improvement within 72 hours or if any symptoms change or worsen at any time  Advised ER or urgent Care if after hours or on weekend. Call 911 for emergency symptoms at any time.Patinet verbalized understanding of all instructions given/reviewed and treatment plan and has no further questions or concerns at this time.      I discussed the assessment and treatment plan with the patient. The patient was  provided an opportunity to ask questions and all were answered. The patient agreed with the plan and demonstrated an understanding of the instructions.   The patient was advised to call back or seek an in-person evaluation if the symptoms worsen or if the condition fails to improve as anticipated.  I provided  20 minutes of non-face-to-face time during this encounter.  The entirety of the information documented in the History of Present Illness, Review of Systems and Physical Exam were personally obtained by me. Portions of this information were initially documented by the  Certified Medical Assistant whose name is documented in [place] and reviewed by me for thoroughness and accuracy.  I have personally performed the exam and reviewed the chart and it is accurate to the best of my knowledge.  Haematologist has been used and any errors in dictation or transcription are unintentional.  Kelby Aline. [employer], [place] [phone] (phone) [phone] (fax)  [place]

## 2019-11-17 NOTE — Telephone Encounter (Signed)
Patient scheduled for telephone visit

## 2019-11-17 NOTE — Patient Instructions (Signed)
Cephalexin Tablets or Capsules What is this medicine? CEPHALEXIN (sef a LEX in) is a cephalosporin antibiotic. It treats some infections caused by bacteria. It will not work for colds, the flu, or other viruses. This medicine may be used for other purposes; ask your health care provider or pharmacist if you have questions. COMMON BRAND NAME(S): Biocef, Daxbia, Keflex, Keftab What should I tell my health care provider before I take this medicine? They need to know if you have any of these conditions:  kidney disease  stomach or intestine problems, especially colitis  an unusual or allergic reaction to cephalexin, other cephalosporins, penicillins, other antibiotics, medicines, foods, dyes or preservatives  pregnant or trying to get pregnant  breast-feeding How should I use this medicine? Take this drug by mouth. Take it as directed on the prescription label at the same time every day. You can take it with or without food. If it upsets your stomach, take it with food. Take all of this drug unless your health care provider tells you to stop it early. Keep taking it even if you think you are better. Talk to your health care provider about the use of this drug in children. While it may be prescribed for selected conditions, precautions do apply. Overdosage: If you think you have taken too much of this medicine contact a poison control center or emergency room at once. NOTE: This medicine is only for you. Do not share this medicine with others. What if I miss a dose? If you miss a dose, take it as soon as you can. If it is almost time for your next dose, take only that dose. Do not take double or extra doses. What may interact with this medicine?  probenecid  some other antibiotics This list may not describe all possible interactions. Give your health care provider a list of all the medicines, herbs, non-prescription drugs, or dietary supplements you use. Also tell them if you smoke, drink  alcohol, or use illegal drugs. Some items may interact with your medicine. What should I watch for while using this medicine? Tell your doctor or health care provider if your symptoms do not begin to improve in a few days. This medicine may cause serious skin reactions. They can happen weeks to months after starting the medicine. Contact your health care provider right away if you notice fevers or flu-like symptoms with a rash. The rash may be red or purple and then turn into blisters or peeling of the skin. Or, you might notice a red rash with swelling of the face, lips or lymph nodes in your neck or under your arms. Do not treat diarrhea with over the counter products. Contact your doctor if you have diarrhea that lasts more than 2 days or if it is severe and watery. If you have diabetes, you may get a false-positive result for sugar in your urine. Check with your doctor or health care provider. What side effects may I notice from receiving this medicine? Side effects that you should report to your doctor or health care professional as soon as possible:  allergic reactions like skin rash, itching or hives, swelling of the face, lips, or tongue  breathing problems  pain or trouble passing urine  redness, blistering, peeling or loosening of the skin, including inside the mouth  severe or watery diarrhea  unusually weak or tired  yellowing of the eyes, skin Side effects that usually do not require medical attention (report to your doctor or health care professional   if they continue or are bothersome):  gas or heartburn  genital or anal irritation  headache  joint or muscle pain  nausea, vomiting This list may not describe all possible side effects. Call your doctor for medical advice about side effects. You may report side effects to FDA at 1-800-FDA-1088. Where should I keep my medicine? Keep out of the reach of children and pets. Store at room temperature between 20 and 25  degrees C (68 and 77 degrees F). Throw away any unused drug after the expiration date. NOTE: This sheet is a summary. It may not cover all possible information. If you have questions about this medicine, talk to your doctor, pharmacist, or health care provider.  2020 Elsevier/Gold Standard (2019-03-06 11:27:00) Phenazopyridine tablets What is this medicine? PHENAZOPYRIDINE (fen az oh PEER i deen) is a pain reliever. It is used to stop the pain, burning, or discomfort caused by infection or irritation of the urinary tract. This medicine is not an antibiotic. It will not cure a urinary tract infection. This medicine may be used for other purposes; ask your health care provider or pharmacist if you have questions. COMMON BRAND NAME(S): AZO, Azo-100, Azo-Gesic, Azo-Septic, Azo-Standard, Phenazo, Prodium, Pyridium, Urinary Analgesic, Uristat, Uristat Ultra What should I tell my health care provider before I take this medicine? They need to know if you have any of these conditions:  glucose-6-phosphate dehydrogenase (G6PD) deficiency  kidney disease  an unusual or allergic reaction to phenazopyridine, other medicines, foods, dyes, or preservatives  pregnant or trying to get pregnant  breast-feeding How should I use this medicine? Take this medicine by mouth with a glass of water. Follow the directions on the prescription label. Take after meals. Take your doses at regular intervals. Do not take your medicine more often than directed. Do not skip doses or stop your medicine early even if you feel better. Do not stop taking except on your doctor's advice. Talk to your pediatrician regarding the use of this medicine in children. Special care may be needed. Overdosage: If you think you have taken too much of this medicine contact a poison control center or emergency room at once. NOTE: This medicine is only for you. Do not share this medicine with others. What if I miss a dose? If you miss a dose,  take it as soon as you can. If it is almost time for your next dose, take only that dose. Do not take double or extra doses. What may interact with this medicine? Interactions are not expected. This list may not describe all possible interactions. Give your health care provider a list of all the medicines, herbs, non-prescription drugs, or dietary supplements you use. Also tell them if you smoke, drink alcohol, or use illegal drugs. Some items may interact with your medicine. What should I watch for while using this medicine? Tell your doctor or health care professional if your symptoms do not improve or if they get worse. This medicine colors body fluids red. This effect is harmless and will go away after you are done taking the medicine. It will change urine to an dark orange or red color. The red color may stain clothing. Soft contact lenses may become permanently stained. It is best not to wear soft contact lenses while taking this medicine. If you are diabetic you may get a false positive result for sugar in your urine. Talk to your health care provider. What side effects may I notice from receiving this medicine? Side effects that you  should report to your doctor or health care professional as soon as possible:  allergic reactions like skin rash, itching or hives, swelling of the face, lips, or tongue  blue or purple color of the skin  difficulty breathing  fever  less urine  unusual bleeding, bruising  unusual tired, weak  vomiting  yellowing of the eyes or skin Side effects that usually do not require medical attention (report to your doctor or health care professional if they continue or are bothersome):  dark urine  headache  stomach upset This list may not describe all possible side effects. Call your doctor for medical advice about side effects. You may report side effects to FDA at 1-800-FDA-1088. Where should I keep my medicine? Keep out of the reach of  children. Store at room temperature between 15 and 30 degrees C (59 and 86 degrees F). Protect from light and moisture. Throw away any unused medicine after the expiration date. NOTE: This sheet is a summary. It may not cover all possible information. If you have questions about this medicine, talk to your doctor, pharmacist, or health care provider.  2020 Elsevier/Gold Standard (2008-02-26 11:04:07) Dysuria Dysuria is pain or discomfort while urinating. The pain or discomfort may be felt in the part of your body that drains urine from the bladder (urethra) or in the surrounding tissue of the genitals. The pain may also be felt in the groin area, lower abdomen, or lower back. You may have to urinate frequently or have the sudden feeling that you have to urinate (urgency). Dysuria can affect both men and women, but it is more common in women. Dysuria can be caused by many different things, including:  Urinary tract infection.  Kidney stones or bladder stones.  Certain sexually transmitted infections (STIs), such as chlamydia.  Dehydration.  Inflammation of the tissues of the vagina.  Use of certain medicines.  Use of certain soaps or scented products that cause irritation. Follow these instructions at home: General instructions  Watch your condition for any changes.  Urinate often. Avoid holding urine for long periods of time.  After a bowel movement or urination, women should cleanse from front to back, using each tissue only once.  Urinate after sexual intercourse.  Keep all follow-up visits as told by your health care provider. This is important.  If you had any tests done to find the cause of dysuria, it is up to you to get your test results. Ask your health care provider, or the department that is doing the test, when your results will be ready. Eating and drinking   Drink enough fluid to keep your urine pale yellow.  Avoid caffeine, tea, and alcohol. They can irritate  the bladder and make dysuria worse. In men, alcohol may irritate the prostate. Medicines  Take over-the-counter and prescription medicines only as told by your health care provider.  If you were prescribed an antibiotic medicine, take it as told by your health care provider. Do not stop taking the antibiotic even if you start to feel better. Contact a health care provider if:  You have a fever.  You develop pain in your back or sides.  You have nausea or vomiting.  You have blood in your urine.  You are not urinating as often as you usually do. Get help right away if:  Your pain is severe and not relieved with medicines.  You cannot eat or drink without vomiting.  You are confused.  You have a rapid heartbeat while  at rest.  You have shaking or chills.  You feel extremely weak. Summary  Dysuria is pain or discomfort while urinating. Many different conditions can lead to dysuria.  If you have dysuria, you may have to urinate frequently or have the sudden feeling that you have to urinate (urgency).  Watch your condition for any changes. Keep all follow-up visits as told by your health care provider.  Make sure that you urinate often and drink enough fluid to keep your urine pale yellow. This information is not intended to replace advice given to you by your health care provider. Make sure you discuss any questions you have with your health care provider. Document Revised: 07/12/2017 Document Reviewed: 05/16/2017 Elsevier Patient Education  [place].

## 2019-11-26 ENCOUNTER — Other Ambulatory Visit: Payer: Self-pay | Admitting: Family Medicine

## 2019-11-26 DIAGNOSIS — I10 Essential (primary) hypertension: Secondary | ICD-10-CM

## 2019-12-10 ENCOUNTER — Other Ambulatory Visit: Payer: Self-pay

## 2019-12-10 ENCOUNTER — Ambulatory Visit: Payer: Medicare PPO | Admitting: Family Medicine

## 2019-12-10 ENCOUNTER — Other Ambulatory Visit: Payer: Self-pay | Admitting: Family Medicine

## 2019-12-10 ENCOUNTER — Encounter: Payer: Self-pay | Admitting: Family Medicine

## 2019-12-10 VITALS — BP 116/66 | HR 66 | Temp 96.9°F | Wt 275.0 lb

## 2019-12-10 DIAGNOSIS — R350 Frequency of micturition: Secondary | ICD-10-CM | POA: Diagnosis not present

## 2019-12-10 DIAGNOSIS — M170 Bilateral primary osteoarthritis of knee: Secondary | ICD-10-CM | POA: Diagnosis not present

## 2019-12-10 LAB — POCT URINALYSIS DIPSTICK
Bilirubin, UA: NEGATIVE
Blood, UA: NEGATIVE
Glucose, UA: NEGATIVE
Ketones, UA: NEGATIVE
Nitrite, UA: NEGATIVE
Protein, UA: POSITIVE — AB
Spec Grav, UA: 1.025 (ref 1.010–1.025)
Urobilinogen, UA: 0.2 E.U./dL
pH, UA: 5 (ref 5.0–8.0)

## 2019-12-10 NOTE — Progress Notes (Signed)
I,Laura E Walsh,acting as a scribe for Shirlee Latch, MD.,have documented all relevant documentation on the behalf of Shirlee Latch, MD,as directed by  Shirlee Latch, MD while in the presence of Shirlee Latch, MD.  Established patient visit   Patient: Tara Potter   DOB: 01/22/1946   74 y.o. Female  MRN: [id number] Visit Date: 12/11/2019  Today's healthcare provider: Shirlee Latch, MD   Chief Complaint  Patient presents with  . Knee Pain  . Urinary Tract Infection   Subjective    Knee Pain  There was no injury mechanism (Chronic Bilateral knee pain.  Pt comes in every three months for an injection. ). The pain is present in the left knee and right knee. Pertinent negatives include no inability to bear weight, loss of motion, loss of sensation, muscle weakness, numbness or tingling. The symptoms are aggravated by movement and weight bearing.  Urinary Tract Infection  This is a recurrent problem. The problem has been waxing and waning. Associated symptoms include frequency and urgency. Pertinent negatives include no flank pain, hematuria, nausea or vomiting. She has tried antibiotics for the symptoms. The treatment provided mild relief.    Patient Active Problem List   Diagnosis Date Noted  . Frequency of urination 12/10/2019  . Sciatica of left side 11/19/2018  . DJD (degenerative joint disease), lumbar 07/15/2017  . Bursitis of right shoulder 04/11/2017  . Morbid obesity (HCC) 04/11/2017  . Healthcare maintenance 04/11/2017  . Prediabetes 03/14/2016  . Arthritis, degenerative 09/29/2008  . Essential (primary) hypertension 02/25/2008  . Cardiac murmur 01/29/2008  . HLD (hyperlipidemia) 08/13/1998  . Adult hypothyroidism 08/13/1998  . Hay fever 08/13/1998   Past Medical History:  Diagnosis Date  . Arthritis   . Hyperlipidemia   . Hypertension    Social History   Tobacco Use  . Smoking status: Never Smoker  . Smokeless tobacco: Never Used   Substance Use Topics  . Alcohol use: Not Currently  . Drug use: No   Allergies  Allergen Reactions  . Amoxicillin Diarrhea    Augmentin  . Entex T [Pseudoephedrine-Guaifenesin]     keeps awake; pt states she still takes this  . Erythromycin Diarrhea       Medications: Outpatient Medications Prior to Visit  Medication Sig  . acetaminophen (TYLENOL) 650 MG CR tablet Take 650 mg by mouth every 8 (eight) hours as needed for pain.  Marland Kitchen aspirin EC 81 MG tablet Take 81 mg by mouth daily.   Marland Kitchen atorvastatin (LIPITOR) 10 MG tablet Take 1 tablet (10 mg total) by mouth daily.  . bisoprolol-hydrochlorothiazide (ZIAC) 5-6.25 MG tablet Take 1 tablet by mouth once daily  . diclofenac (VOLTAREN) 75 MG EC tablet Take 1 tablet by mouth twice daily  . diclofenac Sodium (VOLTAREN) 1 % GEL Apply topically 4 (four) times daily.  Janann August 75 MCG tablet Take 1 tablet by mouth once daily  . famotidine (PEPCID) 20 MG tablet Take 1 tablet by mouth twice daily  . fluticasone (FLONASE) 50 MCG/ACT nasal spray Use 2 spray(s) in each nostril once daily  . gabapentin (NEURONTIN) 100 MG capsule TAKE 1 CAPSULE BY MOUTH THREE TIMES A DAY (Patient taking differently: Take 100 mg by mouth at bedtime. )  . Multiple Vitamins-Minerals (MULTIVITAL PO) Take by mouth daily. When remembers  . cephALEXin (KEFLEX) 500 MG capsule Take 1 capsule (500 mg total) by mouth 3 (three) times daily.   No facility-administered medications prior to visit.    Review of Systems  Constitutional: Negative.   Gastrointestinal: Positive for constipation. Negative for abdominal distention, abdominal pain, anal bleeding, blood in stool, diarrhea, nausea, rectal pain and vomiting.  Genitourinary: Positive for frequency and urgency. Negative for difficulty urinating, dyspareunia, dysuria, enuresis, flank pain, hematuria, menstrual problem, pelvic pain, vaginal bleeding, vaginal discharge and vaginal pain.  Musculoskeletal: Positive for arthralgias  and gait problem. Negative for back pain, joint swelling, myalgias, neck pain and neck stiffness.  Neurological: Negative for dizziness, tingling, light-headedness, numbness and headaches.    Last CBC Lab Results  Component Value Date   WBC 10.2 04/30/2018   HGB 14.4 04/30/2018   HCT 41.9 04/30/2018   MCV 88 04/30/2018   MCH 30.2 04/30/2018   RDW 14.1 04/30/2018   PLT 312 04/30/2018   Last metabolic panel Lab Results  Component Value Date   GLUCOSE 86 09/11/2019   NA 141 09/11/2019   K 4.2 09/11/2019   CL 101 09/11/2019   CO2 24 09/11/2019   BUN 22 09/11/2019   CREATININE 1.01 (H) 09/11/2019   GFRNONAA 55 (L) 09/11/2019   GFRAA 63 09/11/2019   CALCIUM 10.0 09/11/2019   PROT 6.7 09/11/2019   ALBUMIN 4.0 09/11/2019   LABGLOB 2.7 09/11/2019   AGRATIO 1.5 09/11/2019   BILITOT 0.4 09/11/2019   ALKPHOS 141 (H) 09/11/2019   AST 13 09/11/2019   ALT 9 09/11/2019   Last lipids Lab Results  Component Value Date   CHOL 180 09/11/2019   HDL 42 09/11/2019   LDLCALC 114 (H) 09/11/2019   TRIG 137 09/11/2019   CHOLHDL 4.3 09/11/2019   Last hemoglobin A1c Lab Results  Component Value Date   HGBA1C 5.5 09/11/2019   Last thyroid functions Lab Results  Component Value Date   TSH 2.250 09/11/2019   Last vitamin D No results found for: 25OHVITD2, 25OHVITD3, VD25OH Last vitamin B12 and Folate No results found for: VITAMINB12, FOLATE    Objective    BP 116/66 (BP Location: Left Arm, Patient Position: Sitting, Cuff Size: Large)   Pulse 66   Temp (!) 96.9 F (36.1 C) (Temporal)   Wt 275 lb (124.7 kg)   SpO2 97%   BMI 40.61 kg/m  BP Readings from Last 3 Encounters:  12/10/19 116/66  11/09/19 115/74  09/11/19 118/70      Physical Exam Vitals and nursing note reviewed.  Constitutional:      General: She is not in acute distress.    Appearance: Normal appearance. She is not diaphoretic.  Eyes:     Conjunctiva/sclera: Conjunctivae normal.  Cardiovascular:      Rate and Rhythm: Normal rate and regular rhythm.     Pulses: Normal pulses.     Heart sounds: Normal heart sounds.  Pulmonary:     Effort: Pulmonary effort is normal. No respiratory distress.     Breath sounds: Normal breath sounds. No wheezing.  Abdominal:     General: There is no distension.     Palpations: Abdomen is soft.     Tenderness: There is no abdominal tenderness.  Musculoskeletal:     Right knee: No effusion, erythema or ecchymosis. Tenderness present over the medial joint line and lateral joint line. No LCL laxity, MCL laxity, ACL laxity or PCL laxity.     Left knee: No effusion, erythema or ecchymosis. Tenderness present over the medial joint line and lateral joint line. No LCL laxity, MCL laxity, ACL laxity or PCL laxity.    Right lower leg: No edema.     Left lower leg:  No edema.  Skin:    General: Skin is warm and dry.     Findings: No rash.  Neurological:     Mental Status: She is alert and oriented to person, place, and time. Mental status is at baseline.     Gait: Gait abnormal (antalgic).  Psychiatric:        Mood and Affect: Mood normal.        Behavior: Behavior normal.        Thought Content: Thought content normal.        Judgment: Judgment normal.      Results for orders placed or performed in visit on 12/10/19  POCT urinalysis dipstick  Result Value Ref Range   Color, UA     Clarity, UA     Glucose, UA Negative Negative   Bilirubin, UA Negative    Ketones, UA Negative    Spec Grav, UA 1.025 1.010 - 1.025   Blood, UA Negative    pH, UA 5.0 5.0 - 8.0   Protein, UA Positive (A) Negative   Urobilinogen, UA 0.2 0.2 or 1.0 E.U./dL   Nitrite, UA Negative    Leukocytes, UA Trace (A) Negative   Appearance     Odor      Assessment & Plan     Problem List Items Addressed This Visit      Musculoskeletal and Integument   RESOLVED: Primary osteoarthritis of both knees   Relevant Orders   Ambulatory referral to Pain Clinic     Other   Morbid  obesity ([place])    Discussed importance of healthy weight management Discussed diet and exercise       Frequency of urination - Primary    Pt completed a round of Keflex 11/17/2019  Pt reports improvement in dysuria which is some back pain remaining I suspect the back pain is actually musculoskeletal and not related to UTI UA is benign today We will send urine culture to confirm clearance of bacteria Return precautions discussed      Relevant Orders   POCT urinalysis dipstick (Completed)   CULTURE, URINE COMPREHENSIVE      INJECTION: Patient was given informed consent,. Appropriate time out was taken. Area prepped and draped in usual sterile fashion. 1 cc of depo-medrol 40 mg/ml plus  4 cc of 1% lidocaine without epinephrine was injected into each of the knees using a(n) anteromedial approach for R knee and anterolateral for L knee. The patient tolerated the procedure well. There were no complications. Post procedure instructions were given.    Return for as scheduled.      I, [name], MD, have reviewed all documentation for this visit. The documentation on 12/11/19 for the exam, diagnosis, procedures, and orders are all accurate and complete.   Mavryk Pino, Dionne Bucy, MD, MPH [facility]

## 2019-12-10 NOTE — Assessment & Plan Note (Addendum)
Pt completed a round of Keflex 11/17/2019  Pt reports improvement in dysuria which is some back pain remaining I suspect the back pain is actually musculoskeletal and not related to UTI UA is benign today We will send urine culture to confirm clearance of bacteria Return precautions discussed

## 2019-12-11 DIAGNOSIS — L538 Other specified erythematous conditions: Secondary | ICD-10-CM | POA: Diagnosis not present

## 2019-12-11 DIAGNOSIS — L820 Inflamed seborrheic keratosis: Secondary | ICD-10-CM | POA: Diagnosis not present

## 2019-12-11 NOTE — Assessment & Plan Note (Signed)
Discussed importance of healthy weight management Discussed diet and exercise  

## 2019-12-11 NOTE — Assessment & Plan Note (Signed)
OA of bilateral knees, T/L spine Repeat steroid injections in bilateral knees again today If helping, will continue no more than q3 months Has failed Synvisc previously Not a candidate for TKA given morbid obesity Continue voltaren gel prn Continue scheduled tylenol and gabapentin qhs Discussed interventional pain management with Dr Cherylann Ratel - patient agrees to referral today to explore other options for chronic knee pain

## 2019-12-13 LAB — CULTURE, URINE COMPREHENSIVE

## 2019-12-14 ENCOUNTER — Telehealth: Payer: Self-pay

## 2019-12-14 MED ORDER — SULFAMETHOXAZOLE-TRIMETHOPRIM 800-160 MG PO TABS: 1.0000 | ORAL_TABLET | Freq: Two times a day (BID) | ORAL | 0 refills | Status: AC

## 2019-12-14 NOTE — Telephone Encounter (Signed)
Patient advised as below. Patient verbalizes understanding and is in agreement with treatment plan.  

## 2019-12-14 NOTE — Telephone Encounter (Signed)
-----   Message from Erasmo Downer, MD sent at 12/14/2019  8:42 AM EDT ----- Urine culture confirms UTI that is not completely resolved.  Recommend treatment with Bactrim DS 1 tab BID x7 days - ok to ERx

## 2019-12-16 ENCOUNTER — Telehealth: Payer: Self-pay | Admitting: Family Medicine

## 2019-12-16 MED ORDER — PREDNISONE 20 MG PO TABS: ORAL_TABLET | ORAL | 0 refills | Status: DC

## 2019-12-16 NOTE — Telephone Encounter (Signed)
Rx sent. We discussed at her visit the other day when she thought it was UTI

## 2019-12-16 NOTE — Telephone Encounter (Signed)
Patient advised as below.  

## 2019-12-16 NOTE — Telephone Encounter (Signed)
Medication Refill - Medication: Prednisone, pt says this is the best for her back flare ups ([place])   Has the patient contacted their pharmacy? Yes.   (Agent: If no, request that the patient contact the pharmacy for the refill.) (Agent: If yes, when and what did the pharmacy advise?)  Preferred Pharmacy (with phone number or street name):  [id number] - [place], [place] - [address]  [address] [place] [address]  Phone: [phone] Fax: [phone]     Agent: Please be advised that RX refills may take up to 3 business days. We ask that you follow-up with your pharmacy.

## 2019-12-23 ENCOUNTER — Other Ambulatory Visit: Payer: Self-pay | Admitting: Family Medicine

## 2020-01-27 ENCOUNTER — Telehealth: Payer: Self-pay

## 2020-01-27 NOTE — Telephone Encounter (Signed)
Copied from CRM [id number]. Topic: General - Other >> Jan 27, 2020  9:49 AM Marylen Ponto wrote: Reason for CRM: Pt stated she was prescribed an ointment (clobetasol) for ant bites. Pt requests that a Rx refill be sent to [facility] [address], [place] - [address]. Pt declined to schedule an appt as she has an appt scheduled for 03/10/20.

## 2020-01-28 NOTE — Telephone Encounter (Signed)
I do not see where medication is on history list  - was this recent ? If she has a new rash or bites she needs to schedule office visit virtual or in person okay.

## 2020-01-28 NOTE — Telephone Encounter (Signed)
?   I checked the old system [facility]) and it looks like she was prescribed Clobetasol several times for contact dermatitis. Her last refill was prescribed by [facility] on 11/06/2014. The prescription and instructions are below.    ?  Clobetasol Propionate 0.05%, 1 (one) applicaton applicaton three times daily, as needed, 60 Gram   Would you still like me to advise patient that she needs OV for evaluation?

## 2020-01-28 NOTE — Telephone Encounter (Signed)
Tried calling patient. Left message to call back. OK for [facility] to advise and schedule appointment.  

## 2020-01-28 NOTE — Telephone Encounter (Signed)
Yes an appointment to know exactly what diagnosis is  advised since las was 2018.

## 2020-01-29 NOTE — Telephone Encounter (Signed)
Call to patient:  She states she has one small area and can not come in for appointment now- she states she wants to be prepared for exposure to poison oak because she is starting to work in her yard. Advised her to come to office if she gets worse- wear protective gloves and sleeves while working outside.

## 2020-02-02 ENCOUNTER — Ambulatory Visit
Admission: RE | Admit: 2020-02-02 | Discharge: 2020-02-02 | Disposition: A | Discharge status: Home / Self Care | Payer: Medicare PPO | Source: Ambulatory Visit | Attending: Student in an Organized Health Care Education/Training Program | Admitting: Student in an Organized Health Care Education/Training Program

## 2020-02-02 ENCOUNTER — Other Ambulatory Visit: Payer: Self-pay

## 2020-02-02 ENCOUNTER — Ambulatory Visit: Payer: Medicare PPO | Admitting: Student in an Organized Health Care Education/Training Program

## 2020-02-02 ENCOUNTER — Encounter: Payer: Self-pay | Admitting: Student in an Organized Health Care Education/Training Program

## 2020-02-02 VITALS — BP 126/77 | HR 63 | Temp 97.5°F | Resp 20 | Ht 69.0 in | Wt 275.0 lb

## 2020-02-02 DIAGNOSIS — M25561 Pain in right knee: Secondary | ICD-10-CM | POA: Diagnosis not present

## 2020-02-02 DIAGNOSIS — M160 Bilateral primary osteoarthritis of hip: Secondary | ICD-10-CM | POA: Diagnosis not present

## 2020-02-02 DIAGNOSIS — M533 Sacrococcygeal disorders, not elsewhere classified: Secondary | ICD-10-CM | POA: Diagnosis not present

## 2020-02-02 DIAGNOSIS — M47816 Spondylosis without myelopathy or radiculopathy, lumbar region: Secondary | ICD-10-CM | POA: Diagnosis not present

## 2020-02-02 DIAGNOSIS — M1611 Unilateral primary osteoarthritis, right hip: Secondary | ICD-10-CM | POA: Diagnosis not present

## 2020-02-02 DIAGNOSIS — G894 Chronic pain syndrome: Secondary | ICD-10-CM | POA: Insufficient documentation

## 2020-02-02 DIAGNOSIS — M25551 Pain in right hip: Secondary | ICD-10-CM | POA: Insufficient documentation

## 2020-02-02 DIAGNOSIS — M170 Bilateral primary osteoarthritis of knee: Secondary | ICD-10-CM | POA: Diagnosis not present

## 2020-02-02 DIAGNOSIS — M25552 Pain in left hip: Secondary | ICD-10-CM

## 2020-02-02 DIAGNOSIS — M5136 Other intervertebral disc degeneration, lumbar region: Secondary | ICD-10-CM | POA: Insufficient documentation

## 2020-02-02 DIAGNOSIS — G8929 Other chronic pain: Secondary | ICD-10-CM | POA: Insufficient documentation

## 2020-02-02 DIAGNOSIS — M25562 Pain in left knee: Secondary | ICD-10-CM | POA: Diagnosis not present

## 2020-02-02 DIAGNOSIS — M1612 Unilateral primary osteoarthritis, left hip: Secondary | ICD-10-CM | POA: Diagnosis not present

## 2020-02-02 NOTE — Progress Notes (Signed)
Patient: Tara Potter  Service Category: E/M  Provider: Gillis Santa, MD  DOB: 13-Dec-1945  DOS: 02/02/2020  Referring Provider: Virginia Crews, MD  MRN: [id number]  Setting: Ambulatory outpatient  PCP: Virginia Crews, MD  Type: New Patient  Specialty: Interventional Pain Management    Location: Office  Delivery: Face-to-face     Primary Reason(s) for Visit: Encounter for initial evaluation of one or more chronic problems (new to examiner) potentially causing chronic pain, and posing a threat to normal musculoskeletal function. (Level of risk: High) CC: Knee Pain (bilateral, right is worse) and Back Pain (low)  HPI  Tara Potter is a 74 y.o. year old, female patient, who comes today to see [place] for the first time for an initial evaluation of her chronic pain. She has HLD (hyperlipidemia); Adult hypothyroidism; Hay fever; Essential (primary) hypertension; Arthritis, degenerative; Cardiac murmur; Prediabetes; Bursitis of right shoulder; Morbid obesity ([place]); Healthcare maintenance; Lumbar facet arthropathy; Sciatica of left side; Bilateral primary osteoarthritis of knee; Frequency of urination; Lumbar degenerative disc disease; Chronic pain of both knees; Sacroiliac joint pain; Bilateral hip pain; and Chronic pain syndrome on their problem list. Today she comes in for evaluation of her Knee Pain (bilateral, right is worse) and Back Pain (low)  Pain Assessment: Location: Right, Left (right knee is worse) Knee Radiating: denies Onset: More than a month ago Duration: Chronic pain Quality: Aching, Sharp, Stabbing Severity: 7 /10 (subjective, self-reported pain score)  Effect on ADL: limits activities Timing: Intermittent Modifying factors: sitting, lying BP: 126/77  HR: 63  Onset and Duration: Gradual and Present longer than 3 months Cause of pain: Unknown Severity: Getting worse, NAS-11 at its worse: 9/10, NAS-11 at its best: 2/10, NAS-11 now: 5/10 and NAS-11 on the average: 6/10 Timing: Not  influenced by the time of the day, During activity or exercise and After a period of immobility Aggravating Factors: Climbing, Motion, Prolonged sitting, Prolonged standing and Walking Alleviating Factors: Hot packs, Medications and Sitting Associated Problems: Pain that wakes patient up Quality of Pain: Aching, Annoying, Dull, Sharp, Stabbing and Uncomfortable Previous Examinations or Tests: X-rays, Orthopedic evaluation and Chiropractic evaluation Previous Treatments: Chiropractic manipulations, Epidural steroid injections and Steroid treatments by mouth  The patient comes into the clinics today for the first time for a chronic pain management evaluation.   Tara Potter is a pleasant 74 year old female who presents with a chief complaint of low back pain and bilateral knee pain, left greater than right.  She also has a history of morbid obesity.  She is being referred from her primary care provider, Dr.Bacigalupo for persistent bilateral knee pain related to knee osteoarthritis.  Patient has tried intra-articular steroid injections as well as viscosupplementation which over time has become less effective.  She does find it difficult to bear weight.  She ambulates with a cane.  She utilizes Voltaren gel as well as p.o. Voltaren.  She wants to avoid any medications that could increase her risk of sedation or drowsiness.  Patient was a middle Education officer, museum.  Unfortunately she lost her husband last year.  She lives by herself.  She performs all ADLs by herself.  Historic Controlled Substance Pharmacotherapy Review  Historical Background Evaluation: [place] PMP: PDMP not reviewed this encounter. Six (6) year initial data search conducted.             [place] Department of public safety, offender search: Editor, commissioning Information) Non-contributory Risk Assessment Profile: Aberrant behavior: None observed or detected today Risk factors for fatal opioid overdose:  None identified today Fatal overdose hazard ratio (HR):  Calculation deferred Non-fatal overdose hazard ratio (HR): Calculation deferred Risk of opioid abuse or dependence: 0.7-3.0% with doses ? 36 MME/day and 6.1-26% with doses ? 120 MME/day. Substance use disorder (SUD) risk level: See below Personal History of Substance Abuse (SUD-Substance use disorder):  Alcohol: Negative  Illegal Drugs: Negative  Rx Drugs: Negative  ORT Risk Level calculation: Low Risk  Opioid Risk Tool - 02/02/20 1318      Family History of Substance Abuse   Alcohol Negative    Illegal Drugs Negative    Rx Drugs Negative      Personal History of Substance Abuse   Alcohol Negative    Illegal Drugs Negative    Rx Drugs Negative      Age   Age between 26-45 years  No      History of Preadolescent Sexual Abuse   History of Preadolescent Sexual Abuse Negative or Female      Psychological Disease   Psychological Disease Negative    Depression Negative      Total Score   Opioid Risk Tool Scoring 0    Opioid Risk Interpretation Low Risk          ORT Scoring interpretation table:  Score <3 = Low Risk for SUD  Score between 4-7 = Moderate Risk for SUD  Score >8 = High Risk for Opioid Abuse   PHQ-2 Depression Scale:  Total score: 0  PHQ-2 Scoring interpretation table: (Score and probability of major depressive disorder)  Score 0 = No depression  Score 1 = 15.4% Probability  Score 2 = 21.1% Probability  Score 3 = 38.4% Probability  Score 4 = 45.5% Probability  Score 5 = 56.4% Probability  Score 6 = 78.6% Probability   PHQ-9 Depression Scale:  Total score: 0  PHQ-9 Scoring interpretation table:  Score 0-4 = No depression  Score 5-9 = Mild depression  Score 10-14 = Moderate depression  Score 15-19 = Moderately severe depression  Score 20-27 = Severe depression (2.4 times higher risk of SUD and 2.89 times higher risk of overuse)   Pharmacologic Plan: Tara Potter indicated having a preference to stay away from opioid analgesics.            Initial  impression: The patient indicated having no interest, at this time.  Will focus on interventional treatments.  Meds   Current Outpatient Medications:  .  acetaminophen (TYLENOL) 650 MG CR tablet, Take 650 mg by mouth every 8 (eight) hours as needed for pain., Disp: , Rfl:  .  aspirin EC 81 MG tablet, Take 81 mg by mouth every other day. , Disp: , Rfl:  .  atorvastatin (LIPITOR) 10 MG tablet, Take 1 tablet (10 mg total) by mouth daily., Disp: 90 tablet, Rfl: 3 .  bisoprolol-hydrochlorothiazide (ZIAC) 5-6.25 MG tablet, Take 1 tablet by mouth once daily, Disp: 90 tablet, Rfl: 0 .  diclofenac (VOLTAREN) 75 MG EC tablet, Take 1 tablet by mouth twice daily, Disp: 180 tablet, Rfl: 0 .  diclofenac Sodium (VOLTAREN) 1 % GEL, Apply topically 4 (four) times daily., Disp: , Rfl:  .  EUTHYROX 75 MCG tablet, Take 1 tablet by mouth once daily, Disp: 90 tablet, Rfl: 3 .  famotidine (PEPCID) 20 MG tablet, Take 1 tablet by mouth twice daily, Disp: 60 tablet, Rfl: 0 .  fluticasone (FLONASE) 50 MCG/ACT nasal spray, Use 2 spray(s) in each nostril once daily, Disp: 48 g, Rfl: 6 .  gabapentin (  NEURONTIN) 100 MG capsule, TAKE 1 CAPSULE BY MOUTH THREE TIMES A DAY (Patient taking differently: Take 100 mg by mouth at bedtime. ), Disp: 90 capsule, Rfl: 3 .  loratadine (CLARITIN) 10 MG tablet, Take 10 mg by mouth daily as needed for allergies., Disp: , Rfl:  .  Multiple Vitamins-Minerals (MULTIVITAL PO), Take by mouth daily. When remembers, Disp: , Rfl:   Imaging Review    Shoulder-R DG: Results for orders placed during the hospital encounter of 02/28/17  DG Shoulder Right  Narrative CLINICAL DATA:  Approximate three-month history of right shoulder pain which the patient states happened after she lifted her grandson. No discrete injury.  EXAM: RIGHT SHOULDER - 2+ VIEW  COMPARISON:  None.  FINDINGS: No evidence of acute fracture or glenohumeral dislocation. Glenohumeral joint space well-preserved. Subacromial  space well-preserved. Minimal calcification at the insertion of the supraspinatus tendon on the greater tuberosity of the humeral head. Acromioclavicular joint intact. Well preserved bone mineral density. No intrinsic osseous abnormality.  IMPRESSION: Minimal chronic calcific supraspinatus tendinitis. Otherwise normal examination.   Electronically Signed By: Evangeline Dakin M.D. On: 02/28/2017 19:34     Complexity Note: Imaging results reviewed. Results shared with Tara Potter, using Layman's terms.                         ROS  Cardiovascular: Daily Aspirin intake and High blood pressure Pulmonary or Respiratory: Snoring  Neurological: No reported neurological signs or symptoms such as seizures, abnormal skin sensations, urinary and/or fecal incontinence, being born with an abnormal open spine and/or a tethered spinal cord Psychological-Psychiatric: No reported psychological or psychiatric signs or symptoms such as difficulty sleeping, anxiety, depression, delusions or hallucinations (schizophrenial), mood swings (bipolar disorders) or suicidal ideations or attempts Gastrointestinal: Reflux or heatburn and Irregular, infrequent bowel movements (Constipation) Genitourinary: Passing kidney stones and Recurrent Urinary Tract infections Hematological: No reported hematological signs or symptoms such as prolonged bleeding, low or poor functioning platelets, bruising or bleeding easily, hereditary bleeding problems, low energy levels due to low hemoglobin or being anemic Endocrine: Slow thyroid Rheumatologic: Joint aches and or swelling due to excess weight (Osteoarthritis) Musculoskeletal: Negative for myasthenia gravis, muscular dystrophy, multiple sclerosis or malignant hyperthermia Work History: Retired  Allergies  Tara Potter is allergic to amoxicillin, entex t [pseudoephedrine-guaifenesin], and erythromycin.  Laboratory Chemistry Profile   Renal Lab Results  Component Value Date    BUN 22 09/11/2019   CREATININE 1.01 (H) 09/11/2019   BCR 22 09/11/2019   GFRAA 63 09/11/2019   GFRNONAA 55 (L) 09/11/2019   SPECGRAV 1.025 12/10/2019   PHUR 5.0 12/10/2019   PROTEINUR Positive (A) 12/10/2019     Electrolytes Lab Results  Component Value Date   NA 141 09/11/2019   K 4.2 09/11/2019   CL 101 09/11/2019   CALCIUM 10.0 09/11/2019     Hepatic Lab Results  Component Value Date   AST 13 09/11/2019   ALT 9 09/11/2019   ALBUMIN 4.0 09/11/2019   ALKPHOS 141 (H) 09/11/2019     ID No results found for: LYMEIGGIGMAB, HIV, SARSCOV2NAA, STAPHAUREUS, MRSAPCR, HCVAB, PREGTESTUR, RMSFIGG, QFVRPH1IGG, QFVRPH2IGG, LYMEIGGIGMAB   Bone No results found for: Marveen Reeks, [id number], [id number], 25OHVITD1, 25OHVITD2, 25OHVITD3, TESTOFREE, TESTOSTERONE   Endocrine Lab Results  Component Value Date   GLUCOSE 86 09/11/2019   HGBA1C 5.5 09/11/2019   TSH 2.250 09/11/2019     Neuropathy Lab Results  Component Value Date   HGBA1C 5.5 09/11/2019  CNS No results found for: COLORCSF, APPEARCSF, RBCCOUNTCSF, WBCCSF, POLYSCSF, LYMPHSCSF, EOSCSF, PROTEINCSF, GLUCCSF, JCVIRUS, CSFOLI, IGGCSF, LABACHR, ACETBL, LABACHR, ACETBL   Inflammation (CRP: Acute  ESR: Chronic) No results found for: CRP, ESRSEDRATE, LATICACIDVEN   Rheumatology No results found for: RF, ANA, LABURIC, URICUR, LYMEIGGIGMAB, LYMEABIGMQN, HLAB27   Coagulation Lab Results  Component Value Date   PLT 312 04/30/2018     Cardiovascular Lab Results  Component Value Date   HGB 14.4 04/30/2018   HCT 41.9 04/30/2018     Screening No results found for: SARSCOV2NAA, COVIDSOURCE, STAPHAUREUS, MRSAPCR, HCVAB, HIV, PREGTESTUR   Cancer No results found for: CEA, CA125, LABCA2   Allergens No results found for: ALMOND, APPLE, ASPARAGUS, AVOCADO, BANANA, BARLEY, BASIL, BAYLEAF, GREENBEAN, LIMABEAN, WHITEBEAN, BEEFIGE, REDBEET, BLUEBERRY, BROCCOLI, CABBAGE, MELON, CARROT, CASEIN, CASHEWNUT, CAULIFLOWER,  CELERY     Note: Lab results reviewed.   PFSH  Drug: Tara Potter  reports no history of drug use. Alcohol:  reports previous alcohol use. Tobacco:  reports that she has never smoked. She has never used smokeless tobacco. Medical:  has a past medical history of Arthritis, Hyperlipidemia, and Hypertension. Family: family history includes Arthritis in her sister; Atrial fibrillation in her mother; Heart disease in her father; Hypertension in her mother; Kidney disease in her mother; Macular degeneration in her mother.  Past Surgical History:  Procedure Laterality Date  . DILATION AND CURETTAGE OF UTERUS    . TUBAL LIGATION     Active Ambulatory Problems    Diagnosis Date Noted  . HLD (hyperlipidemia) 08/13/1998  . Adult hypothyroidism 08/13/1998  . Hay fever 08/13/1998  . Essential (primary) hypertension 02/25/2008  . Arthritis, degenerative 09/29/2008  . Cardiac murmur 01/29/2008  . Prediabetes 03/14/2016  . Bursitis of right shoulder 04/11/2017  . Morbid obesity ([place]) 04/11/2017  . Healthcare maintenance 04/11/2017  . Lumbar facet arthropathy 07/15/2017  . Sciatica of left side 11/19/2018  . Bilateral primary osteoarthritis of knee 12/10/2019  . Frequency of urination 12/10/2019  . Lumbar degenerative disc disease 02/02/2020  . Chronic pain of both knees 02/02/2020  . Sacroiliac joint pain 02/02/2020  . Bilateral hip pain 02/02/2020  . Chronic pain syndrome 02/02/2020   Resolved Ambulatory Problems    Diagnosis Date Noted  . Dysfunction of right eustachian tube 10/14/2017  . Urinary urgency 10/14/2017   Past Medical History:  Diagnosis Date  . Arthritis   . Hyperlipidemia   . Hypertension    Constitutional Exam  General appearance: alert, cooperative, in no distress and morbidly obese Vitals:   02/02/20 1309 02/02/20 1310  BP:  126/77  Pulse:  63  Resp:  20  Temp:  (!) 97.5 F (36.4 C)  SpO2:  99%  Weight: 275 lb (124.7 kg)   Height: _0  (1.753 m)    BMI  Assessment: Estimated body mass index is 40.61 kg/m as calculated from the following:   Height as of this encounter: _1  (1.753 m).   Weight as of this encounter: 275 lb (124.7 kg).  BMI interpretation table: BMI level Category Range association with higher incidence of chronic pain  <18 kg/m2 Underweight   18.5-24.9 kg/m2 Ideal body weight   25-29.9 kg/m2 Overweight Increased incidence by 20%  30-34.9 kg/m2 Obese (Class I) Increased incidence by 68%  35-39.9 kg/m2 Severe obesity (Class II) Increased incidence by 136%  >40 kg/m2 Extreme obesity (Class III) Increased incidence by 254%   Patient's current BMI Ideal Body weight  Body mass index is 40.61 kg/m. Ideal  body weight: 66.2 kg (145 lb 15.1 oz) Adjusted ideal body weight: 89.6 kg (197 lb 9.1 oz)   BMI Readings from Last 4 Encounters:  02/02/20 40.61 kg/m  12/10/19 40.61 kg/m  11/09/19 41.05 kg/m  09/11/19 41.59 kg/m   Wt Readings from Last 4 Encounters:  02/02/20 275 lb (124.7 kg)  12/10/19 275 lb (124.7 kg)  11/09/19 278 lb (126.1 kg)  09/11/19 281 lb 9.6 oz (127.7 kg)    Psych/Mental status: Alert, oriented x 3 (person, place, & time)       Eyes: PERLA Respiratory: No evidence of acute respiratory distress  Cervical Spine Exam  Skin & Axial Inspection: No masses, redness, edema, swelling, or associated skin lesions Alignment: Symmetrical Functional ROM: Unrestricted ROM      Stability: No instability detected Muscle Tone/Strength: Functionally intact. No obvious neuro-muscular anomalies detected. Sensory (Neurological): Unimpaired Palpation: No palpable anomalies              Upper Extremity (UE) Exam    Side: Right upper extremity  Side: Left upper extremity  Skin & Extremity Inspection: Skin color, temperature, and hair growth are WNL. No peripheral edema or cyanosis. No masses, redness, swelling, asymmetry, or associated skin lesions. No contractures.  Skin & Extremity Inspection: Skin color,  temperature, and hair growth are WNL. No peripheral edema or cyanosis. No masses, redness, swelling, asymmetry, or associated skin lesions. No contractures.  Functional ROM: Unrestricted ROM          Functional ROM: Unrestricted ROM          Muscle Tone/Strength: Functionally intact. No obvious neuro-muscular anomalies detected.  Muscle Tone/Strength: Functionally intact. No obvious neuro-muscular anomalies detected.  Sensory (Neurological): Unimpaired          Sensory (Neurological): Unimpaired          Palpation: No palpable anomalies              Palpation: No palpable anomalies              Provocative Test(s):  Phalen's test: deferred Tinel's test: deferred Apley's scratch test (touch opposite shoulder):  Action 1 (Across chest): deferred Action 2 (Overhead): deferred Action 3 (LB reach): deferred   Provocative Test(s):  Phalen's test: deferred Tinel's test: deferred Apley's scratch test (touch opposite shoulder):  Action 1 (Across chest): deferred Action 2 (Overhead): deferred Action 3 (LB reach): deferred    Thoracic Spine Area Exam  Skin & Axial Inspection: No masses, redness, or swelling Alignment: Symmetrical Functional ROM: Unrestricted ROM Stability: No instability detected Muscle Tone/Strength: Functionally intact. No obvious neuro-muscular anomalies detected. Sensory (Neurological): Unimpaired Muscle strength & Tone: No palpable anomalies  Lumbar Exam  Skin & Axial Inspection: No masses, redness, or swelling Alignment: Symmetrical Functional ROM: Pain restricted ROM affecting both sides Stability: No instability detected Muscle Tone/Strength: Functionally intact. No obvious neuro-muscular anomalies detected. Sensory (Neurological): Musculoskeletal pain pattern Palpation: Complains of area being tender to palpation       Provocative Tests: Hyperextension/rotation test: (+) bilaterally for facet joint pain. Lumbar quadrant test (Kemp's test): deferred today        Lateral bending test: deferred today       Patrick's Maneuver: deferred today                   FABER* test: deferred today                   S-I anterior distraction/compression test: deferred today  S-I lateral compression test: deferred today         S-I Thigh-thrust test: deferred today         S-I Gaenslen's test: deferred today         *(Flexion, ABduction and External Rotation)  Gait & Posture Assessment  Ambulation: Patient ambulates using a cane Gait: Age-related, senile gait pattern Posture: Difficulty standing up straight, due to pain   Lower Extremity Exam    Side: Right lower extremity  Side: Left lower extremity  Stability: No instability observed          Stability: No instability observed          Skin & Extremity Inspection: Skin color, temperature, and hair growth are WNL. No peripheral edema or cyanosis. No masses, redness, swelling, asymmetry, or associated skin lesions. No contractures.  Skin & Extremity Inspection: Skin color, temperature, and hair growth are WNL. No peripheral edema or cyanosis. No masses, redness, swelling, asymmetry, or associated skin lesions. No contractures.  Functional ROM: Pain restricted ROM for hip and knee joints          Functional ROM: Pain restricted ROM for hip and knee joints          Muscle Tone/Strength: Functionally intact. No obvious neuro-muscular anomalies detected.  Muscle Tone/Strength: Functionally intact. No obvious neuro-muscular anomalies detected.  Sensory (Neurological): Arthropathic arthralgia        Sensory (Neurological): Arthropathic arthralgia        DTR: Patellar: deferred today Achilles: deferred today Plantar: deferred today  DTR: Patellar: deferred today Achilles: deferred today Plantar: deferred today  Palpation: No palpable anomalies  Palpation: No palpable anomalies   Assessment  Primary Diagnosis & Pertinent Problem List: The primary encounter diagnosis was Bilateral primary osteoarthritis of  knee. Diagnoses of Lumbar facet arthropathy, Lumbar degenerative disc disease, Chronic pain of both knees, Sacroiliac joint pain, Bilateral hip pain, and Chronic pain syndrome were also pertinent to this visit.  Visit Diagnosis (New problems to examiner): 1. Bilateral primary osteoarthritis of knee   2. Lumbar facet arthropathy   3. Lumbar degenerative disc disease   4. Chronic pain of both knees   5. Sacroiliac joint pain   6. Bilateral hip pain   7. Chronic pain syndrome    Plan of Care (Initial workup plan)    1.  Bilateral knee osteoarthritis: Refractory to intra-articular steroid and viscosupplementation.  Discussed diagnostic genicular nerve block and possible radiofrequency ablation of the genicular nerves.  Risks and benefits reviewed and patient would like to proceed.  We will plan for bilateral genicular nerve block under fluoroscopy without sedation.  2.  For low back pain related to lumbar facet arthropathy and lumbar degenerative disc disease, recommend lumbar spine x-ray.  For referred hip and buttock pain, will obtain x-rays of bilateral hips and SI joints.   Imaging Orders     DG HIP UNILAT W OR W/O PELVIS 2-3 VIEWS RIGHT     DG HIP UNILAT W OR W/O PELVIS 2-3 VIEWS LEFT     DG Lumbar Spine Complete W/Bend     DG Si Joints  Procedure Orders     GENICULAR NERVE BLOCK  Pharmacological management options:  Opioid Analgesics: Patient would like to avoid  Membrane stabilizer: On gabapentin 100 mg nightly can consider increasing in future  Muscle relaxant: Not indicated  NSAID: Adequate regimen  Other analgesic(s): To be determined at a later time   Interventional management options: Tara Potter was informed that there is no guarantee  that she would be a candidate for interventional therapies. The decision will be based on the results of diagnostic studies, as well as Tara Potter's risk profile.  Procedure(s) under consideration:  Bilateral genicular nerve block with possible  radiofrequency ablation   Provider-requested follow-up: Return for B/L GNB w/o.  Future Appointments  Date Time Provider [place]  03/10/2020  1:20 PM Bacigalupo, Dionne Bucy, MD [facility]    Note by: Gillis Santa, MD Date: 02/02/2020; Time: 2:01 PM

## 2020-02-02 NOTE — Progress Notes (Signed)
Safety precautions to be maintained throughout the outpatient stay will include: orient to surroundings, keep bed in low position, maintain call bell within reach at all times, provide assistance with transfer out of bed and ambulation.  

## 2020-02-02 NOTE — Patient Instructions (Signed)
Preparing for your procedure (without sedation) Instructions: . Oral Intake: Do not eat or drink anything for at least 3 hours prior to your procedure. . Transportation: Unless otherwise stated by your physician, you may drive yourself after the procedure. . Blood Pressure Medicine: Take your blood pressure medicine with a sip of water the morning of the procedure. . Insulin: Take only  of your normal insulin dose. . Preventing infections: Shower with an antibacterial soap the morning of your procedure. . Build-up your immune system: Take 1000 mg of Vitamin C with every meal (3 times a day) the day prior to your procedure. . Pregnancy: If you are pregnant, call and cancel the procedure. . Sickness: If you have a cold, fever, or any active infections, call and cancel the procedure. . Arrival: You must be in the facility at least 30 minutes prior to your scheduled procedure. . Children: Do not bring any children with you. . Dress appropriately: Bring dark clothing that you would not mind if they get stained. . Valuables: Do not bring any jewelry or valuables. Procedure appointments are reserved for interventional treatments only. . No Prescription Refills. . No medication changes will be discussed during procedure appointments. . No disability issues will be discussed. Genicular Nerve Block  What is a genicular nerve block? A genicular nerve block is the injection of a local anesthetic to block the nerves that transmits pain from the knee.  What is the purpose of a facet nerve block? A genicular nerve block is a diagnostic procedure to determine if the pathologic changes (i.e. arthritis, meniscal tears, etc) and inflammation within the knee joint is the source of your knee pain. It also confirms that the knee pain will respond well to the actual treatment procedure. If a genicular nerve block works, it will give you relief for several hours. After that, the pain is expected to return to  normal. This test is always performed twice (usually a week or two apart) because two successful tests are required to move onto treatment. If both diagnostic tests are positive, then we schedule a treatment called radiofrequency (RF) ablation. In this procedure, the same nerves are cauterized, which typically leads to pain relief for 4 -18 months. If this process works well for one knee, it can be performed on the other knee if needed.  How is the procedure performed? You will be placed on the procedure table. The injection site is sterilized with either iodine or chlorhexadine. The site to be injected is numbed with a local anesthetic, and a needle is directed to the target area. X-ray guidance is used to ensure proper placement and positioning of the needle. When the needle is properly positioned near the genicular nerve, local anesthetic is injected to numb that nerve. This will be repeated at multiple sites around the knee to block all genicular nerves.  Will the procedure be painful? The injection can be painful and we therefore provide the option of receiving IV sedation. IV sedation, combined with local anesthetic, can make the injection nearly pain free. It allows you to remain very still during the procedure, which can also make the injection easier, faster, and more successful. If you decide to have IV sedation, you must have a driver to get you home safely afterwards. In addition, you cannot have anything to eat or drink within 8 hours of your appointment (clear liquids are allowed until 3 hours before the procedure). If you take medications for diabetes, these medications may need to be   adjusted the morning of the procedure. Your primary care physician can help you with this adjustment.  What are the discharge instructions? If you received IV sedation do not drive or operate machinery for at least 24 hours after the procedure. You may return to work the next day following your procedure. You  may resume your normal diet immediately. Do not engage in any strenuous activity for 24 hours. You should, however, engage in moderate activity that typically causes your ususal pain. If the block works, those activities should not be painful for several hours after the injection. Do not take a bath, swim, or use a hot tub for 24 hours (you may take a shower). Call the office if you have any of the following: severe pain afterwards (different than your usual symptoms), redness/swelling/discharge at the injection site(s), fevers/chills, difficulty with bowel or bladder functions.  What are the risks and side effects? The complication rate for this procedure is very low. Whenever a needle enters the skin, bleeding or infection can occur. Some other serious but extremely rare risks include paralysis and death. You may have an allergic reaction to any of the medications used. If you have a known allergy to any medications, especially local anesthetics, notify our staff before the procedure takes place. You may experience any of the following side effects up to 4 - 6 hours after the procedure: Leg muscle weakness or numbness may occur due to the local anesthetic affecting the nerves that control your legs (this is a temporary affect and it is not paralysis). If you have any leg weakness or numbness, walk only with assistance in order to prevent falls and injury. Your leg strength will return slowly and completely. Dizziness may occur due to a decrease in your blood pressure. If this occurs, remain in a seated or lying position. Gradually sit up, and then stand after at least 10 minutes of sitting. Mild headaches may occur. Drink fluids and take pain medications if needed. If the headaches persist or become severe, call the office. Mild discomfort at the injection site can occur. This typically lasts for a few hours but can persist for a couple days. If this occurs, take anti-inflammatories or pain medications,  apply ice to the area the day of the procedure. If it persists, apply moist heat in the day(s) following.  The side effects listed above can be normal. They are not dangerous and will resolve on their own. If, however, you experience any of the following, a complication may have occurred and you should either contact your doctor. If he is not readily available, then you should proceed to the closest urgent care center for evaluation: Severe or progressive pain at the injection site(s) Arm or leg weakness that progressively worsens or persists for longer than 8 hours Severe or progressive redness, swelling, or discharge from the injections site(s) Fevers, chills, nausea, or vomiting Bowel or bladder dysfunction (i.e. inability to urinate or pass stool or difficulty controlling either)  How long does it take for the procedure to work? You should feel relief from your usual pain within the first hour. Again, this is only expected to last for several hours, at the most. Remember, you may be sore in the middle part of your back from the needles, and you must distinguish this from your usual pain. Marland Kitchen

## 2020-02-10 ENCOUNTER — Other Ambulatory Visit: Payer: Self-pay

## 2020-02-10 ENCOUNTER — Encounter: Payer: Self-pay | Admitting: Student in an Organized Health Care Education/Training Program

## 2020-02-10 ENCOUNTER — Ambulatory Visit (HOSPITAL_BASED_OUTPATIENT_CLINIC_OR_DEPARTMENT_OTHER): Payer: Medicare PPO | Admitting: Student in an Organized Health Care Education/Training Program

## 2020-02-10 ENCOUNTER — Ambulatory Visit
Admission: RE | Admit: 2020-02-10 | Discharge: 2020-02-10 | Disposition: A | Discharge status: Home / Self Care | Payer: Medicare PPO | Source: Ambulatory Visit | Attending: Student in an Organized Health Care Education/Training Program | Admitting: Student in an Organized Health Care Education/Training Program

## 2020-02-10 DIAGNOSIS — M25562 Pain in left knee: Secondary | ICD-10-CM | POA: Insufficient documentation

## 2020-02-10 DIAGNOSIS — M170 Bilateral primary osteoarthritis of knee: Secondary | ICD-10-CM | POA: Diagnosis not present

## 2020-02-10 DIAGNOSIS — G8929 Other chronic pain: Secondary | ICD-10-CM | POA: Diagnosis not present

## 2020-02-10 DIAGNOSIS — M25561 Pain in right knee: Secondary | ICD-10-CM | POA: Insufficient documentation

## 2020-02-10 MED ORDER — LIDOCAINE HCL 2 % IJ SOLN
20.0000 mL | Freq: Once | INTRAMUSCULAR | Status: AC
  Administered 2020-02-10: 400 mg

## 2020-02-10 MED ORDER — DEXAMETHASONE SODIUM PHOSPHATE 10 MG/ML IJ SOLN
10.0000 mg | Freq: Once | INTRAMUSCULAR | Status: AC
  Administered 2020-02-10: 10 mg
  Filled 2020-02-10: qty 1

## 2020-02-10 MED ORDER — ROPIVACAINE HCL 2 MG/ML IJ SOLN
9.0000 mL | Freq: Once | INTRAMUSCULAR | Status: AC
  Administered 2020-02-10: 9 mL via PERINEURAL
  Filled 2020-02-10: qty 10

## 2020-02-10 NOTE — Progress Notes (Signed)
PROVIDER NOTE: Information contained herein reflects review and annotations entered in association with encounter. Interpretation of such information and data should be left to medically-trained personnel. Information provided to patient can be located elsewhere in the medical record under "Patient Instructions". Document created using STT-dictation technology, any transcriptional errors that may result from process are unintentional.    Patient: Tara Potter  Service Category: Procedure  Provider: Edward Jolly, MD  DOB: Jan 05, 1946  DOS: 02/10/2020  Location: [facility]  MRN: [id number]  Setting: Ambulatory - outpatient  Referring Provider: Edward Jolly, MD  Type: Established Patient  Specialty: Interventional Pain Management  PCP: Erasmo Downer, MD   Primary Reason for Visit: Interventional Pain Management Treatment. CC: Knee Pain  Procedure:          Anesthesia, Analgesia, Anxiolysis:  Type: Genicular Nerves Block (Superolateral, Superomedial, and Inferomedial Genicular Nerves) #1  CPT: 64450      Primary Purpose: Diagnostic Region: Lateral, Anterior, and Medial aspects of the knee joint, above and below the knee joint proper. Level: Superior and inferior to the knee joint. Target Area: For Genicular Nerve block(s), the targets are: the superolateral genicular nerve, located in the lateral distal portion of the femoral shaft as it curves to form the lateral epicondyle, in the region of the distal femoral metaphysis; the superomedial genicular nerve, located in the medial distal portion of the femoral shaft as it curves to form the medial epicondyle; and the inferomedial genicular nerve, located in the medial, proximal portion of the tibial shaft, as it curves to form the medial epicondyle, in the region of the proximal tibial metaphysis. Approach: Anterior, percutaneous, ipsilateral approach. Laterality: Bilateral  Type: Local Anesthesia  Local Anesthetic: Lidocaine  1-2%  Position: Modified Fowler's position with pillows under the targeted knee(s).   Indications: 1. Bilateral primary osteoarthritis of knee   2. Chronic pain of both knees    Pain Score: Pre-procedure: 5 /10 Post-procedure: 5 /10   Pre-op Assessment:  [name] is a 74 y.o. (year old), female patient, seen today for interventional treatment. She  has a past surgical history that includes Tubal ligation and Dilation and curettage of uterus. [name] has a current medication list which includes the following prescription(s): acetaminophen, aspirin ec, atorvastatin, bisoprolol-hydrochlorothiazide, diclofenac, diclofenac sodium, euthyrox, famotidine, fluticasone, gabapentin, loratadine, and multiple vitamins-minerals. Her primarily concern today is the Knee Pain  Initial Vital Signs:  Pulse/HCG Rate: 69ECG Heart Rate: 68 Temp: 97.8 F (36.6 C) Resp: 16 BP: 136/71 SpO2: (!) 68 %  BMI: Estimated body mass index is 40.61 kg/m as calculated from the following:   Height as of this encounter: 5\' 9"  (1.753 m).   Weight as of this encounter: 275 lb (124.7 kg).  Risk Assessment: Allergies: Reviewed. She is allergic to amoxicillin, entex t [pseudoephedrine-guaifenesin], and erythromycin.  Allergy Precautions: None required Coagulopathies: Reviewed. None identified.  Blood-thinner therapy: None at this time Active Infection(s): Reviewed. None identified. [name] is afebrile  Site Confirmation: [name] was asked to confirm the procedure and laterality before marking the site Procedure checklist: Completed Consent: Before the procedure and under the influence of no sedative(s), amnesic(s), or anxiolytics, the patient was informed of the treatment options, risks and possible complications. To fulfill our ethical and legal obligations, as recommended by the American Medical Association's Code of Ethics, I have informed the patient of my clinical impression; the nature and purpose of the treatment  or procedure; the risks, benefits, and possible complications of the intervention;  the alternatives, including doing nothing; the risk(s) and benefit(s) of the alternative treatment(s) or procedure(s); and the risk(s) and benefit(s) of doing nothing. The patient was provided information about the general risks and possible complications associated with the procedure. These may include, but are not limited to: failure to achieve desired goals, infection, bleeding, organ or nerve damage, allergic reactions, paralysis, and death. In addition, the patient was informed of those risks and complications associated to the procedure, such as failure to decrease pain; infection; bleeding; organ or nerve damage with subsequent damage to sensory, motor, and/or autonomic systems, resulting in permanent pain, numbness, and/or weakness of one or several areas of the body; allergic reactions; (i.e.: anaphylactic reaction); and/or death. Furthermore, the patient was informed of those risks and complications associated with the medications. These include, but are not limited to: allergic reactions (i.e.: anaphylactic or anaphylactoid reaction(s)); adrenal axis suppression; blood sugar elevation that in diabetics may result in ketoacidosis or comma; water retention that in patients with history of congestive heart failure may result in shortness of breath, pulmonary edema, and decompensation with resultant heart failure; weight gain; swelling or edema; medication-induced neural toxicity; particulate matter embolism and blood vessel occlusion with resultant organ, and/or nervous system infarction; and/or aseptic necrosis of one or more joints. Finally, the patient was informed that Medicine is not an exact science; therefore, there is also the possibility of unforeseen or unpredictable risks and/or possible complications that may result in a catastrophic outcome. The patient indicated having understood very clearly. We have given  the patient no guarantees and we have made no promises. Enough time was given to the patient to ask questions, all of which were answered to the patient's satisfaction. Ms. Pung has indicated that she wanted to continue with the procedure. Attestation: I, the ordering provider, attest that I have discussed with the patient the benefits, risks, side-effects, alternatives, likelihood of achieving goals, and potential problems during recovery for the procedure that I have provided informed consent. Date  Time: 02/10/2020 10:50 AM  Pre-Procedure Preparation:  Monitoring: As per clinic protocol. Respiration, ETCO2, SpO2, BP, heart rate and rhythm monitor placed and checked for adequate function Safety Precautions: Patient was assessed for positional comfort and pressure points before starting the procedure. Time-out: I initiated and conducted the "Time-out" before starting the procedure, as per protocol. The patient was asked to participate by confirming the accuracy of the "Time Out" information. Verification of the correct person, site, and procedure were performed and confirmed by me, the nursing staff, and the patient. "Time-out" conducted as per Joint Commission's Universal Protocol ([email or web address].01.01.01). Time: 1155  Description of Procedure:          Area Prepped: Entire knee area, from mid-thigh to mid-shin, lateral, anterior, and medial aspects. DuraPrep (Iodine Povacrylex [0.7% available iodine] and Isopropyl Alcohol, 74% w/w) Safety Precautions: Aspiration looking for blood return was conducted prior to all injections. At no point did we inject any substances, as a needle was being advanced. No attempts were made at seeking any paresthesias. Safe injection practices and needle disposal techniques used. Medications properly checked for expiration dates. SDV (single dose vial) medications used. Description of the Procedure: Protocol guidelines were followed. The patient was placed in position over the  procedure table. The target area was identified and the area prepped in the usual manner. Skin & deeper tissues infiltrated with local anesthetic. Appropriate amount of time allowed to pass for local anesthetics to take effect. The procedure needles were then advanced to the target  area. Proper needle placement secured. Negative aspiration confirmed. Solution injected in intermittent fashion, asking for systemic symptoms every 0.5cc of injectate. The needles were then removed and the area cleansed, making sure to leave some of the prepping solution back to take advantage of its long term bactericidal properties.  Vitals:   02/10/20 1102 02/10/20 1155 02/10/20 1200 02/10/20 1205  BP: 136/71 (!) 148/70  (!) 150/83  Pulse: 69     Resp: 16 16 16 18   Temp: 97.8 F (36.6 C)     TempSrc: Temporal     SpO2: (!) 68% 96% 97% 95%  Weight: 275 lb (124.7 kg)     Height: 5\' 9"  (1.753 m)       Start Time: 1155 hrs. End Time: 1210 hrs. Materials:  Needle(s) Type: Spinal Needle Gauge: 25G Length: 3.5-in Medication(s): Please see orders for medications and dosing details. 6 cc solution made of 5 cc of 0.2% ropivacaine, 1 cc of Decadron 10 mg/cc.  2 cc injected at each level above for the left knee 6 cc solution made of 5 cc of 0.2% ropivacaine, 1 cc of Decadron 10 mg/cc.  2 cc injected at each level above for the right knee Imaging Guidance (Non-Spinal):          Type of Imaging Technique: Fluoroscopy Guidance (Non-Spinal) Indication(s): Assistance in needle guidance and placement for procedures requiring needle placement in or near specific anatomical locations not easily accessible without such assistance. Exposure Time: Please see nurses notes. Contrast: None used. Fluoroscopic Guidance: I was personally present during the use of fluoroscopy. "Tunnel Vision Technique" used to obtain the best possible view of the target area. Parallax error corrected before commencing the procedure.  "Direction-depth-direction" technique used to introduce the needle under continuous pulsed fluoroscopy. Once target was reached, antero-posterior, oblique, and lateral fluoroscopic projection used confirm needle placement in all planes. Images permanently stored in EMR. Interpretation: No contrast injected. I personally interpreted the imaging intraoperatively. Adequate needle placement confirmed in multiple planes. Permanent images saved into the patient's record.  Antibiotic Prophylaxis:   Anti-infectives (From admission, onward)   None     Indication(s): None identified  Post-operative Assessment:  Post-procedure Vital Signs:  Pulse/HCG Rate: 6967 Temp: 97.8 F (36.6 C) Resp: 18 BP: (!) 150/83 SpO2: 95 %  EBL: None  Complications: No immediate post-treatment complications observed by team, or reported by patient.  Note: The patient tolerated the entire procedure well. A repeat set of vitals were taken after the procedure and the patient was kept under observation following institutional policy, for this type of procedure. Post-procedural neurological assessment was performed, showing return to baseline, prior to discharge. The patient was provided with post-procedure discharge instructions, including a section on how to identify potential problems. Should any problems arise concerning this procedure, the patient was given instructions to immediately contact , at any time, without hesitation. In any case, we plan to contact the patient by telephone for a follow-up status report regarding this interventional procedure.  Comments:  No additional relevant information.  Plan of Care  Orders:  Orders Placed This Encounter  Procedures  . DG PAIN CLIN[facility] C-ARM 1-60 MIN NO REPORT    Intraoperative interpretation by procedural physician at [facility].    Standing Status:   Standing    Number of Occurrences:   1    Order Specific Question:   Reason for exam:    Answer:    Assistance in needle guidance and placement for procedures requiring needle placement  in or near specific anatomical locations not easily accessible without such assistance.   Medications ordered for procedure: Meds ordered this encounter  Medications  . lidocaine (XYLOCAINE) 2 % (with pres) injection 400 mg  . ropivacaine (PF) 2 mg/mL (0.2%) (NAROPIN) injection 9 mL  . ropivacaine (PF) 2 mg/mL (0.2%) (NAROPIN) injection 9 mL  . dexamethasone (DECADRON) injection 10 mg  . dexamethasone (DECADRON) injection 10 mg   Medications administered: We administered lidocaine, ropivacaine (PF) 2 mg/mL (0.2%), ropivacaine (PF) 2 mg/mL (0.2%), dexamethasone, and dexamethasone.  See the medical record for exact dosing, route, and time of administration.  Follow-up plan:   Return in about 4 weeks (around 03/09/2020) for Post Procedure Evaluation, in person.      Bilateral genicular nerve block 02/10/2020   Recent Visits Date Type Provider Dept  02/02/20 Office Visit Edward JollyLateef, Shakoya Gilmore, MD [facility]  Showing recent visits within past 90 days and meeting all other requirements Today's Visits Date Type Provider Dept  02/10/20 Procedure visit Edward JollyLateef, Cathyrn Deas, MD [facility]  Showing today's visits and meeting all other requirements Future Appointments Date Type Provider Dept  03/10/20 Appointment Edward JollyLateef, Roseland Braun, MD [facility]  Showing future appointments within next 90 days and meeting all other requirements  Disposition: Discharge home  Discharge (Date  Time): 02/10/2020; 1225 hrs.   Primary Care Physician: Erasmo DownerBacigalupo, Angela M, MD [facility] Note by: Edward JollyBilal Darroll Bredeson, MD Date: 02/10/2020; Time: 1:26 PM  Disclaimer:  Medicine is not an exact science. The only guarantee in medicine is that nothing is guaranteed. It is important to note that the decision to proceed with this intervention was based on the information collected from the  patient. The Data and conclusions were drawn from the patient's questionnaire, the interview, and the physical examination. Because the information was provided in large part by the patient, it cannot be guaranteed that it has not been purposely or unconsciously manipulated. Every effort has been made to obtain as much relevant data as possible for this evaluation. It is important to note that the conclusions that lead to this procedure are derived in large part from the available data. Always take into account that the treatment will also be dependent on availability of resources and existing treatment guidelines, considered by other Pain Management Practitioners as being common knowledge and practice, at the time of the intervention. For Medico-Legal purposes, it is also important to point out that variation in procedural techniques and pharmacological choices are the acceptable norm. The indications, contraindications, technique, and results of the above procedure should only be interpreted and judged by a Board-Certified Interventional Pain Specialist with extensive familiarity and expertise in the same exact procedure and technique.

## 2020-02-10 NOTE — Progress Notes (Signed)
Safety precautions to be maintained throughout the outpatient stay will include: orient to surroundings, keep bed in low position, maintain call bell within reach at all times, provide assistance with transfer out of bed and ambulation.  

## 2020-02-10 NOTE — Patient Instructions (Signed)
____________________________________________________________________________________________  Genicular Nerve Block  What is a genicular nerve block? A genicular nerve block is the injection of a local anesthetic to block the nerves that transmits pain from the knee.  What is the purpose of a facet nerve block? A genicular nerve block is a diagnostic procedure to determine if the pathologic changes (i.e. arthritis, meniscal tears, etc) and inflammation within the knee joint is the source of your knee pain. It also confirms that the knee pain will respond well to the actual treatment procedure. If a genicular nerve block works, it will give you relief for several hours. After that, the pain is expected to return to normal. This test is always performed twice (usually a week or two apart) because two successful tests are required to move onto treatment. If both diagnostic tests are positive, then we schedule a treatment called radiofrequency (RF) ablation. In this procedure, the same nerves are cauterized, which typically leads to pain relief for 4 -18 months. If this process works well for one knee, it can be performed on the other knee if needed.  How is the procedure performed? You will be placed on the procedure table. The injection site is sterilized with either iodine or chlorhexadine. The site to be injected is numbed with a local anesthetic, and a needle is directed to the target area. X-ray guidance is used to ensure proper placement and positioning of the needle. When the needle is properly positioned near the genicular nerve, local anesthetic is injected to numb that nerve. This will be repeated at multiple sites around the knee to block all genicular nerves.  Will the procedure be painful? The injection can be painful and we therefore provide the option of receiving IV sedation. IV sedation, combined with local anesthetic, can make the injection nearly pain free. It allows you to remain very  still during the procedure, which can also make the injection easier, faster, and more successful. If you decide to have IV sedation, you must have a driver to get you home safely afterwards. In addition, you cannot have anything to eat or drink within 8 hours of your appointment (clear liquids are allowed until 3 hours before the procedure). If you take medications for diabetes, these medications may need to be adjusted the morning of the procedure. Your primary care physician can help you with this adjustment.  What are the discharge instructions? If you received IV sedation do not drive or operate machinery for at least 24 hours after the procedure. You may return to work the next day following your procedure. You may resume your normal diet immediately. Do not engage in any strenuous activity for 24 hours. You should, however, engage in moderate activity that typically causes your ususal pain. If the block works, those activities should not be painful for several hours after the injection. Do not take a bath, swim, or use a hot tub for 24 hours (you may take a shower). Call the office if you have any of the following: severe pain afterwards (different than your usual symptoms), redness/swelling/discharge at the injection site(s), fevers/chills, difficulty with bowel or bladder functions.  What are the risks and side effects? The complication rate for this procedure is very low. Whenever a needle enters the skin, bleeding or infection can occur. Some other serious but extremely rare risks include paralysis and death. You may have an allergic reaction to any of the medications used. If you have a known allergy to any medications, especially local anesthetics, notify  our staff before the procedure takes place. You may experience any of the following side effects up to 4 - 6 hours after the procedure: . Leg muscle weakness or numbness may occur due to the local anesthetic affecting the nerves that control  your legs (this is a temporary affect and it is not paralysis). If you have any leg weakness or numbness, walk only with assistance in order to prevent falls and injury. Your leg strength will return slowly and completely. . Dizziness may occur due to a decrease in your blood pressure. If this occurs, remain in a seated or lying position. Gradually sit up, and then stand after at least 10 minutes of sitting. . Mild headaches may occur. Drink fluids and take pain medications if needed. If the headaches persist or become severe, call the office. . Mild discomfort at the injection site can occur. This typically lasts for a few hours but can persist for a couple days. If this occurs, take anti-inflammatories or pain medications, apply ice to the area the day of the procedure. If it persists, apply moist heat in the day(s) following.  The side effects listed above can be normal. They are not dangerous and will resolve on their own. If, however, you experience any of the following, a complication may have occurred and you should either contact your doctor. If he is not readily available, then you should proceed to the closest urgent care center for evaluation: . Severe or progressive pain at the injection site(s) . Arm or leg weakness that progressively worsens or persists for longer than 8 hours . Severe or progressive redness, swelling, or discharge from the injections site(s) . Fevers, chills, nausea, or vomiting . Bowel or bladder dysfunction (i.e. inability to urinate or pass stool or difficulty controlling either)  How long does it take for the procedure to work? You should feel relief from your usual pain within the first hour. Again, this is only expected to last for several hours, at the most. Remember, you may be sore in the middle part of your back from the needles, and you must distinguish this from your usual  pain. ____________________________________________________________________________________________  Epidural Steroid Injection Patient Information  Description: The epidural space surrounds the nerves as they exit the spinal cord.  In some patients, the nerves can be compressed and inflamed by a bulging disc or a tight spinal canal (spinal stenosis).  By injecting steroids into the epidural space, we can bring irritated nerves into direct contact with a potentially helpful medication.  These steroids act directly on the irritated nerves and can reduce swelling and inflammation which often leads to decreased pain.  Epidural steroids may be injected anywhere along the spine and from the neck to the low back depending upon the location of your pain.   After numbing the skin with local anesthetic (like Novocaine), a small needle is passed into the epidural space slowly.  You may experience a sensation of pressure while this is being done.  The entire block usually last less than 10 minutes.  Conditions which may be treated by epidural steroids:   Low back and leg pain  Neck and arm pain  Spinal stenosis  Post-laminectomy syndrome  Herpes zoster (shingles) pain  Pain from compression fractures  Preparation for the injection:  1. Do not eat any solid food or dairy products within 8 hours of your appointment.  2. You may drink clear liquids up to 3 hours before appointment.  Clear liquids include water, black  coffee, juice or soda.  No milk or cream please. 3. You may take your regular medication, including pain medications, with a sip of water before your appointment  Diabetics should hold regular insulin (if taken separately) and take 1/2 normal NPH dos the morning of the procedure.  Carry some sugar containing items with you to your appointment. 4. A driver must accompany you and be prepared to drive you home after your procedure.  5. Bring all your current medications with your. 6. An IV  may be inserted and sedation may be given at the discretion of the physician.   7. A blood pressure cuff, EKG and other monitors will often be applied during the procedure.  Some patients may need to have extra oxygen administered for a short period. 8. You will be asked to provide medical information, including your allergies, prior to the procedure.  We must know immediately if you are taking blood thinners (like Coumadin/Warfarin)  Or if you are allergic to IV iodine contrast (dye). We must know if you could possible be pregnant.  Possible side-effects:  Bleeding from needle site  Infection (rare, may require surgery)  Nerve injury (rare)  Numbness & tingling (temporary)  Difficulty urinating (rare, temporary)  Spinal headache ( a headache worse with upright posture)  Light -headedness (temporary)  Pain at injection site (several days)  Decreased blood pressure (temporary)  Weakness in arm/leg (temporary)  Pressure sensation in back/neck (temporary)  Call if you experience:  Fever/chills associated with headache or increased back/neck pain.  Headache worsened by an upright position.  New onset weakness or numbness of an extremity below the injection site  Hives or difficulty breathing (go to the emergency room)  Inflammation or drainage at the infection site  Severe back/neck pain  Any new symptoms which are concerning to you  Please note:  Although the local anesthetic injected can often make your back or neck feel good for several hours after the injection, the pain will likely return.  It takes 3-7 days for steroids to work in the epidural space.  You may not notice any pain relief for at least that one week.  If effective, we will often do a series of three injections spaced 3-6 weeks apart to maximally decrease your pain.  After the initial series, we generally will wait several months before considering a repeat injection of the same type.  If you have any  questions, please call [phone] [facility]

## 2020-02-11 ENCOUNTER — Other Ambulatory Visit: Payer: Self-pay | Admitting: Family Medicine

## 2020-02-11 NOTE — Telephone Encounter (Signed)
Requested Prescriptions  Pending Prescriptions Disp Refills   famotidine (PEPCID) 20 MG tablet [Pharmacy Med Name: Famotidine 20 MG Oral Tablet] 180 tablet 0    Sig: Take 1 tablet by mouth twice daily     Gastroenterology:  H2 Antagonists Passed - 02/11/2020  9:18 AM      Passed - Valid encounter within last 12 months    Recent Outpatient Visits          2 months ago Frequency of urination   [facility] [place], Marzella Schlein, MD   2 months ago Dysuria   [facility] [place], Eula Fried, FNP   3 months ago Gastroesophageal reflux disease, unspecified whether esophagitis present   [facility], Marzella Schlein, MD   5 months ago Encounter for annual physical exam   [facility], Marzella Schlein, MD   7 months ago Dysuria   [facility], Marzella Schlein, MD      Future Appointments            In 4 weeks Bacigalupo, Marzella Schlein, MD [facility]

## 2020-02-12 ENCOUNTER — Telehealth: Payer: Self-pay | Admitting: Family Medicine

## 2020-02-12 NOTE — Telephone Encounter (Signed)
FYI

## 2020-02-12 NOTE — Telephone Encounter (Signed)
Patient wanted to let Dr. Leonard Schwartz. know the pain clinic she was referred to is helping with the pain in her knees.  Thanks, [employer]

## 2020-02-22 ENCOUNTER — Other Ambulatory Visit: Payer: Self-pay | Admitting: Family Medicine

## 2020-02-27 ENCOUNTER — Other Ambulatory Visit: Payer: Self-pay | Admitting: Family Medicine

## 2020-02-27 DIAGNOSIS — I10 Essential (primary) hypertension: Secondary | ICD-10-CM

## 2020-02-27 NOTE — Telephone Encounter (Signed)
Requested Prescriptions  Pending Prescriptions Disp Refills  . bisoprolol-hydrochlorothiazide (ZIAC) 5-6.25 MG tablet [Pharmacy Med Name: Bisoprolol-hydroCHLOROthiazide 5-6.25 MG Oral Tablet] 90 tablet 0    Sig: Take 1 tablet by mouth once daily     Cardiovascular: Beta Blocker + Diuretic Combos Failed - 02/27/2020 10:32 AM      Failed - Cr in normal range and within 180 days    Creatinine, Ser  Date Value Ref Range Status  09/11/2019 1.01 (H) 0.57 - 1.00 mg/dL Final         Failed - Last BP in normal range    BP Readings from Last 1 Encounters:  02/10/20 (!) 150/83         Passed - K in normal range and within 180 days    Potassium  Date Value Ref Range Status  09/11/2019 4.2 3.5 - 5.2 mmol/L Final         Passed - Na in normal range and within 180 days    Sodium  Date Value Ref Range Status  09/11/2019 141 134 - 144 mmol/L Final         Passed - Ca in normal range and within 180 days    Calcium  Date Value Ref Range Status  09/11/2019 10.0 8.7 - 10.3 mg/dL Final         Passed - Patient is not pregnant      Passed - Last Heart Rate in normal range    Pulse Readings from Last 1 Encounters:  02/10/20 69         Passed - Valid encounter within last 6 months    Recent Outpatient Visits          2 months ago Frequency of urination   [facility] [place], Marzella Schlein, MD   3 months ago Dysuria   [facility] [place], Eula Fried, FNP   3 months ago Gastroesophageal reflux disease, unspecified whether esophagitis present   [facility], Marzella Schlein, MD   5 months ago Encounter for annual physical exam   [facility], Marzella Schlein, MD   8 months ago Dysuria   [facility], Marzella Schlein, MD      Future Appointments            In 1 month Bacigalupo, Marzella Schlein, MD [facility]

## 2020-03-10 ENCOUNTER — Other Ambulatory Visit: Payer: Self-pay

## 2020-03-10 ENCOUNTER — Encounter: Payer: Self-pay | Admitting: Student in an Organized Health Care Education/Training Program

## 2020-03-10 ENCOUNTER — Ambulatory Visit: Payer: Self-pay | Admitting: Family Medicine

## 2020-03-10 ENCOUNTER — Ambulatory Visit
Payer: Medicare PPO | Attending: Student in an Organized Health Care Education/Training Program | Admitting: Student in an Organized Health Care Education/Training Program

## 2020-03-10 VITALS — BP 125/44 | HR 63 | Temp 97.2°F | Resp 18 | Ht 69.0 in | Wt 275.0 lb

## 2020-03-10 DIAGNOSIS — G8929 Other chronic pain: Secondary | ICD-10-CM | POA: Diagnosis not present

## 2020-03-10 DIAGNOSIS — M533 Sacrococcygeal disorders, not elsewhere classified: Secondary | ICD-10-CM

## 2020-03-10 DIAGNOSIS — M25561 Pain in right knee: Secondary | ICD-10-CM | POA: Diagnosis not present

## 2020-03-10 DIAGNOSIS — M25562 Pain in left knee: Secondary | ICD-10-CM | POA: Diagnosis not present

## 2020-03-10 DIAGNOSIS — M47816 Spondylosis without myelopathy or radiculopathy, lumbar region: Secondary | ICD-10-CM | POA: Diagnosis not present

## 2020-03-10 DIAGNOSIS — M170 Bilateral primary osteoarthritis of knee: Secondary | ICD-10-CM | POA: Diagnosis not present

## 2020-03-10 DIAGNOSIS — M5136 Other intervertebral disc degeneration, lumbar region: Secondary | ICD-10-CM | POA: Diagnosis not present

## 2020-03-10 NOTE — Progress Notes (Signed)
PROVIDER NOTE: Information contained herein reflects review and annotations entered in association with encounter. Interpretation of such information and data should be left to medically-trained personnel. Information provided to patient can be located elsewhere in the medical record under "Patient Instructions". Document created using STT-dictation technology, any transcriptional errors that may result from process are unintentional.    Patient: Tara Potter  Service Category: E/M  Provider: Gillis Santa, MD  DOB: [birth year]  DOS: 03/10/2020  Specialty: Interventional Pain Management  MRN: [id number]  Setting: Ambulatory outpatient  PCP: Virginia Crews, MD  Type: Established Patient    Referring Provider: Virginia Crews, MD  Location: Office  Delivery: Face-to-face     HPI  Reason for encounter: Tara Potter, a 74 y.o. 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 Tara Potter's primary complain today is Knee Pain (bilateral) Last encounter: Practice (02/10/2020). My last encounter with her was on 02/10/2020. Pertinent problems: Tara Potter has HLD (hyperlipidemia); Essential (primary) hypertension; Morbid obesity ([place]); Lumbar facet arthropathy; Sciatica of left side; Bilateral primary osteoarthritis of knee; Frequency of urination; Lumbar degenerative disc disease; Chronic pain of both knees; Sacroiliac joint pain; Bilateral hip pain; and Chronic pain syndrome on their pertinent problem list. Pain Assessment: Severity of Chronic pain is reported as a 4 /10. Location: Knee Right, Left/denies. Onset:  . Quality: Sharp, Constant, Aching. Timing: Intermittent. Modifying factor(s): rest, hot bath. Vitals:  height is '5\' 9"'  (1.753 m) and weight is 275 lb (124.7 kg) (abnormal). Her temporal temperature is 97.2 F (36.2 C) (abnormal). Her blood pressure is 125/44 (abnormal) and her pulse is 63. Her respiration is 18 and oxygen  saturation is 98%.    Post-Procedure Evaluation  Procedure (02/10/2020):  Bilateral genicular nerve block 02/10/2020  Sedation: Please see nurses note.  Effectiveness during initial hour after procedure(Ultra-Short Term Relief): 90 %   Local anesthetic used: Long-acting (4-6 hours) Effectiveness: Defined as any analgesic benefit obtained secondary to the administration of local anesthetics. This carries significant diagnostic value as to the etiological location, or anatomical origin, of the pain. Duration of benefit is expected to coincide with the duration of the local anesthetic used.  Effectiveness during initial 4-6 hours after procedure(Short-Term Relief): 90 %   Long-term benefit: Defined as any relief past the pharmacologic duration of the local anesthetics.  Effectiveness past the initial 6 hours after procedure(Long-Term Relief): 75 %   Current benefits: Defined as benefit that persist at this time.   Analgesia:  >75% relief Function: Tara Potter reports improvement in function ROM: Tara Potter reports improvement in ROM  ROS  Constitutional: Denies any fever or chills Gastrointestinal: No reported hemesis, hematochezia, vomiting, or acute GI distress Musculoskeletal: + b/l knee pain improved from before Neurological: No reported episodes of acute onset apraxia, aphasia, dysarthria, agnosia, amnesia, paralysis, loss of coordination, or loss of consciousness  Medication Review  Multiple Vitamins-Minerals, acetaminophen, aspirin EC, atorvastatin, bisoprolol-hydrochlorothiazide, diclofenac, diclofenac Sodium, famotidine, fluticasone, gabapentin, levothyroxine, and loratadine  History Review  Allergy: Tara Potter is allergic to amoxicillin, entex t [pseudoephedrine-guaifenesin], and erythromycin. Drug: Tara Potter  reports no history of drug use. Alcohol:  reports previous alcohol use. Tobacco:  reports that she has never smoked. She has never used smokeless tobacco. Social: Tara Potter  reports  that she has never smoked. She has never used smokeless tobacco. She reports previous alcohol use. She reports that she does not use drugs. Medical:  has a  past medical history of Arthritis, Hyperlipidemia, and Hypertension. Surgical: Tara Potter  has a past surgical history that includes Tubal ligation and Dilation and curettage of uterus. Family: family history includes Arthritis in her sister; Atrial fibrillation in her mother; Heart disease in her father; Hypertension in her mother; Kidney disease in her mother; Macular degeneration in her mother.  Laboratory Chemistry Profile   Renal Lab Results  Component Value Date   BUN 22 09/11/2019   CREATININE 1.01 (H) 09/11/2019   BCR 22 09/11/2019   GFRAA 63 09/11/2019   GFRNONAA 55 (L) 09/11/2019     Hepatic Lab Results  Component Value Date   AST 13 09/11/2019   ALT 9 09/11/2019   ALBUMIN 4.0 09/11/2019   ALKPHOS 141 (H) 09/11/2019     Electrolytes Lab Results  Component Value Date   NA 141 09/11/2019   K 4.2 09/11/2019   CL 101 09/11/2019   CALCIUM 10.0 09/11/2019     Bone No results found for: VD25OH, VD125OH2TOT, FA2130QM5, HQ4696EX5, 25OHVITD1, 25OHVITD2, 25OHVITD3, TESTOFREE, TESTOSTERONE   Inflammation (CRP: Acute Phase) (ESR: Chronic Phase) No results found for: CRP, ESRSEDRATE, LATICACIDVEN     Note: Above Lab results reviewed.   Physical Exam  General appearance: Well nourished, well developed, and well hydrated. In no apparent acute distress Mental status: Alert, oriented x 3 (person, place, & time)       Respiratory: No evidence of acute respiratory distress Eyes: PERLA Vitals: BP (!) 125/44   Pulse 63   Temp (!) 97.2 F (36.2 C) (Temporal)   Resp 18   Ht '5\' 9"'  (1.753 m)   Wt (!) 275 lb (124.7 kg)   SpO2 98%   BMI 40.61 kg/m  BMI: Estimated body mass index is 40.61 kg/m as calculated from the following:   Height as of this encounter: '5\' 9"'  (1.753 m).   Weight as of this encounter: 275 lb (124.7  kg). Ideal: Ideal body weight: 66.2 kg (145 lb 15.1 oz) Adjusted ideal body weight: 89.6 kg (197 lb 9.1 oz)  Lumbar Spine Area Exam  Skin & Axial Inspection: No masses, redness, or swelling Alignment: Symmetrical Functional ROM: Mechanically restricted ROM       Stability: No instability detected Muscle Tone/Strength: Functionally intact. No obvious neuro-muscular anomalies detected. Sensory (Neurological): Musculoskeletal pain pattern  Gait & Posture Assessment  Ambulation: Patient came in today in a wheel chair Gait: Limited. Using assistive device to ambulate Posture: Difficulty standing up straight, due to pain  Lower Extremity Exam    Side: Right lower extremity  Side: Left lower extremity  Stability: No instability observed          Stability: No instability observed          Skin & Extremity Inspection: Skin color, temperature, and hair growth are WNL. No peripheral edema or cyanosis. No masses, redness, swelling, asymmetry, or associated skin lesions. No contractures.  Skin & Extremity Inspection: Skin color, temperature, and hair growth are WNL. No peripheral edema or cyanosis. No masses, redness, swelling, asymmetry, or associated skin lesions. No contractures.  Functional ROM: Pain restricted ROM for knee joint        However improved after treatment  Functional ROM: Pain restricted ROM for knee joint however improved after treatment          Muscle Tone/Strength: Functionally intact. No obvious neuro-muscular anomalies detected.  Muscle Tone/Strength: Functionally intact. No obvious neuro-muscular anomalies detected.  Sensory (Neurological): Improved        Sensory (  Neurological): Improved        DTR: Patellar: deferred today Achilles: deferred today Plantar: deferred today  DTR: Patellar: deferred today Achilles: deferred today Plantar: deferred today  Palpation: No palpable anomalies  Palpation: No palpable anomalies    Assessment   Status Diagnosis   Controlled Controlled Controlled 1. Bilateral primary osteoarthritis of knee   2. Lumbar facet arthropathy   3. Lumbar degenerative disc disease   4. Sacroiliac joint pain   5. Chronic pain of both knees      Updated Problems: Problem  Lumbar Degenerative Disc Disease  Chronic Pain of Both Knees  Sacroiliac Joint Pain  Bilateral Hip Pain  Chronic Pain Syndrome  Bilateral Primary Osteoarthritis of Knee  Frequency of Urination  Sciatica of Left Side  Lumbar Facet Arthropathy  Morbid Obesity ([facility])  Essential (Primary) Hypertension  Hld (Hyperlipidemia)    Plan of Care   Tara Potter has a current medication list which includes the following long-term medication(s): atorvastatin, bisoprolol-hydrochlorothiazide, euthyrox, famotidine, fluticasone, gabapentin, and loratadine.  Significant therapeutic benefit after first genicular nerve block.  States that bearing weight on her knees is much easier and has overall less pain.  Can consider repeating in the future.  She also states that her low back pain has improved.  Orders:  Orders Placed This Encounter  Procedures  . GENICULAR NERVE BLOCK    For knee pain.    Standing Status:   Standing    Number of Occurrences:   1    Standing Expiration Date:   03/10/2021    Scheduling Instructions:     Side(s): Bilateral Knee     Level(s): Superior-Lateral, Superior-Medial, and Inferior-Medial Genicular Nerves     Sedation: Patient's choice.     TIMEFRAME: PRN procedure. (Tara Potter will call when needed.)    Order Specific Question:   Where will this procedure be performed?    Answer:   [facility]   Follow-up plan:   Return in about 3 months (around 06/10/2020) for F/U discuss knee pain.     Bilateral genicular nerve block 02/10/2020: Helped significantly, repeat as needed    Recent Visits Date Type Provider Dept  02/10/20 Procedure visit Gillis Santa, MD [facility]  02/02/20 Office Visit Gillis Santa, MD  [facility]  Showing recent visits within past 90 days and meeting all other requirements Today's Visits Date Type Provider Dept  03/10/20 Office Visit Gillis Santa, MD [facility]  Showing today's visits and meeting all other requirements Future Appointments No visits were found meeting these conditions. Showing future appointments within next 90 days and meeting all other requirements  I discussed the assessment and treatment plan with the patient. The patient was provided an opportunity to ask questions and all were answered. The patient agreed with the plan and demonstrated an understanding of the instructions.  Patient advised to call back or seek an in-person evaluation if the symptoms or condition worsens.  Duration of encounter: 15 minutes.  Note by: Gillis Santa, MD Date: 03/10/2020; Time: 1:03 PM

## 2020-03-10 NOTE — Progress Notes (Signed)
Safety precautions to be maintained throughout the outpatient stay will include: orient to surroundings, keep bed in low position, maintain call bell within reach at all times, provide assistance with transfer out of bed and ambulation.  

## 2020-03-23 ENCOUNTER — Other Ambulatory Visit: Payer: Self-pay | Admitting: Family Medicine

## 2020-04-25 ENCOUNTER — Ambulatory Visit: Payer: Self-pay | Admitting: Family Medicine

## 2020-05-11 ENCOUNTER — Other Ambulatory Visit: Payer: Self-pay | Admitting: Family Medicine

## 2020-05-11 NOTE — Telephone Encounter (Signed)
Requested Prescriptions  Pending Prescriptions Disp Refills  . gabapentin (NEURONTIN) 100 MG capsule [Pharmacy Med Name: GABAPENTIN 100 MG CAPSULE] 90 capsule 0    Sig: TAKE 1 CAPSULE BY MOUTH THREE TIMES A DAY     Neurology: Anticonvulsants - gabapentin Passed - 05/11/2020 11:32 AM      Passed - Valid encounter within last 12 months    Recent Outpatient Visits          5 months ago Frequency of urination   [facility] [place], Marzella Schlein, MD   5 months ago Dysuria   [facility] [place], Eula Fried, FNP   6 months ago Gastroesophageal reflux disease, unspecified whether esophagitis present   [facility], Marzella Schlein, MD   8 months ago Encounter for annual physical exam   [facility], Marzella Schlein, MD   10 months ago Dysuria   [facility], Marzella Schlein, MD      Future Appointments            In 1 week Bacigalupo, Marzella Schlein, MD [facility]

## 2020-05-17 ENCOUNTER — Other Ambulatory Visit: Payer: Self-pay | Admitting: Family Medicine

## 2020-05-17 DIAGNOSIS — I10 Essential (primary) hypertension: Secondary | ICD-10-CM

## 2020-05-17 MED ORDER — DICLOFENAC SODIUM 75 MG PO TBEC: 75.0000 mg | DELAYED_RELEASE_TABLET | Freq: Two times a day (BID) | ORAL | 0 refills | Status: DC

## 2020-05-17 MED ORDER — BISOPROLOL-HYDROCHLOROTHIAZIDE 5-6.25 MG PO TABS: 1.0000 | ORAL_TABLET | Freq: Every day | ORAL | 0 refills | Status: DC

## 2020-05-17 NOTE — Telephone Encounter (Signed)
Patient request short term quantity- out of town without medication

## 2020-05-17 NOTE — Telephone Encounter (Signed)
Pt is in [place] with Daughter and did not bring her meds, please call in the following 5 FIVE bisoprolol-hydrochlorothiazide [facility]) 5-6.25 MG tablet Medication Call in 5 FIVE Date: 02/27/2020 Department: [facility] Ordering/Authorizing: Erasmo Downer, MD  Order Providers  diclofenac (VOLTAREN) 75 MG EC tablet Medication  Call in 10 TEN Date: 03/23/2020 Department: [facility] Ordering/Authorizing: Erasmo Downer, MD   The pharmacy is going to work with her on coupons as have recently had refills [facility] [address] [place], [place] - [address] Phone:  [phone]  Fax:  [phone]

## 2020-05-18 ENCOUNTER — Other Ambulatory Visit: Payer: Self-pay | Admitting: Family Medicine

## 2020-05-18 NOTE — Telephone Encounter (Signed)
PT need a refill  diclofenac (VOLTAREN) 75 MG EC tablet Medication  Call in 10 TEN [facility] - [place], [place] - [address]  [address] [place] [address]  Phone: [phone] Fax: [phone]

## 2020-05-18 NOTE — Addendum Note (Signed)
Addended by: Carlean Purl on: 05/18/2020 10:01 AM   Modules accepted: Orders

## 2020-05-24 ENCOUNTER — Ambulatory Visit: Payer: Self-pay | Admitting: Family Medicine

## 2020-05-31 ENCOUNTER — Other Ambulatory Visit: Payer: Self-pay | Admitting: Family Medicine

## 2020-05-31 DIAGNOSIS — I10 Essential (primary) hypertension: Secondary | ICD-10-CM

## 2020-06-01 ENCOUNTER — Telehealth: Payer: Self-pay | Admitting: Student in an Organized Health Care Education/Training Program

## 2020-06-01 NOTE — Telephone Encounter (Signed)
Patient states Dr. Cherylann Ratel said she did not need to come for a follow up appt and could just schedule an appt for a procedure on her knees. Please advise ?

## 2020-06-01 NOTE — Telephone Encounter (Signed)
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

## 2020-06-01 NOTE — Telephone Encounter (Signed)
Left voicemail with patient to please call me back so I can discuss what she and DR Cherylann Ratel talked about.    I have sent a message to DR Cherylann Ratel to see if okay to schedule.   Dr Cherylann Ratel has said okay to schedule.

## 2020-06-02 ENCOUNTER — Ambulatory Visit: Payer: Self-pay | Admitting: Family Medicine

## 2020-06-02 ENCOUNTER — Ambulatory Visit: Payer: Medicare PPO | Admitting: Student in an Organized Health Care Education/Training Program

## 2020-06-09 ENCOUNTER — Other Ambulatory Visit: Payer: Self-pay

## 2020-06-09 ENCOUNTER — Encounter: Payer: Self-pay | Admitting: Family Medicine

## 2020-06-09 ENCOUNTER — Ambulatory Visit: Payer: Medicare PPO | Admitting: Family Medicine

## 2020-06-09 VITALS — BP 110/74 | HR 67 | Temp 98.2°F | Resp 18 | Wt 282.0 lb

## 2020-06-09 DIAGNOSIS — R7303 Prediabetes: Secondary | ICD-10-CM | POA: Diagnosis not present

## 2020-06-09 DIAGNOSIS — Z6841 Body Mass Index (BMI) 40.0 and over, adult: Secondary | ICD-10-CM

## 2020-06-09 DIAGNOSIS — I10 Essential (primary) hypertension: Secondary | ICD-10-CM

## 2020-06-09 DIAGNOSIS — E7800 Pure hypercholesterolemia, unspecified: Secondary | ICD-10-CM | POA: Diagnosis not present

## 2020-06-09 DIAGNOSIS — Z23 Encounter for immunization: Secondary | ICD-10-CM

## 2020-06-09 DIAGNOSIS — E039 Hypothyroidism, unspecified: Secondary | ICD-10-CM | POA: Diagnosis not present

## 2020-06-09 NOTE — Assessment & Plan Note (Signed)
Recheck A1c Encourage low-carb diet 

## 2020-06-09 NOTE — Patient Instructions (Signed)
   The CDC recommends two doses of Shingrix (the shingles vaccine) separated by 2 to 6 months for adults age 75 years and older. I recommend checking with your insurance plan regarding coverage for this vaccine.   

## 2020-06-09 NOTE — Assessment & Plan Note (Signed)
Previously above goal Continue statin Repeat non-fasting LP and CMP Goal LDL < 130

## 2020-06-09 NOTE — Progress Notes (Signed)
Established patient visit   Patient: Tara Potter   DOB: 02/06/1946   74 y.o. Female  MRN: [id number] Visit Date: 06/09/2020  Today's healthcare provider: Shirlee Latch, MD   Chief Complaint  Patient presents with  . Hyperlipidemia  . Hypertension  . Hypothyroidism  . Prediabetes   Subjective    HPI  Hypertension, follow-up  BP Readings from Last 3 Encounters:  06/09/20 110/74  03/10/20 (!) 125/44  02/10/20 (!) 150/83   Wt Readings from Last 3 Encounters:  06/09/20 282 lb (127.9 kg)  03/10/20 (!) 275 lb (124.7 kg)  02/10/20 275 lb (124.7 kg)     She was last seen for hypertension 1 year ago.  BP at that visit was 118/70. Management since that visit includes continue same medications.  She reports good compliance with treatment. She is not having side effects.  She is following a Regular diet. She is not exercising. She does not smoke.  Use of agents associated with hypertension: NSAIDS.   Outside blood pressures are not checked. Symptoms: No chest pain No chest pressure  No palpitations No syncope  No dyspnea No orthopnea  No paroxysmal nocturnal dyspnea No lower extremity edema   Pertinent labs: Lab Results  Component Value Date   CHOL 180 09/11/2019   HDL 42 09/11/2019   LDLCALC 114 (H) 09/11/2019   TRIG 137 09/11/2019   CHOLHDL 4.3 09/11/2019   Lab Results  Component Value Date   NA 141 09/11/2019   K 4.2 09/11/2019   CREATININE 1.01 (H) 09/11/2019   GFRNONAA 55 (L) 09/11/2019   GFRAA 63 09/11/2019   GLUCOSE 86 09/11/2019     The 10-year ASCVD risk score Denman George DC Jr., et al., 2013) is: 15.8%   ---------------------------------------------------------------------------------------------------  Lipid/Cholesterol, Follow-up  Last lipid panel Other pertinent labs  Lab Results  Component Value Date   CHOL 180 09/11/2019   HDL 42 09/11/2019   LDLCALC 114 (H) 09/11/2019   TRIG 137 09/11/2019   CHOLHDL 4.3 09/11/2019   Lab Results    Component Value Date   ALT 9 09/11/2019   AST 13 09/11/2019   PLT 312 04/30/2018   TSH 2.250 09/11/2019     She was last seen for this 1 year ago.  Management since that visit includes recommend increasing statin due to cholesterol not at goal. Patient refused at that time.   She reports good compliance with treatment. She is not having side effects.   Symptoms: No chest pain No chest pressure/discomfort  No dyspnea No lower extremity edema  No numbness or tingling of extremity No orthopnea  No palpitations No paroxysmal nocturnal dyspnea  No speech difficulty No syncope   Current diet: well balanced Current exercise: none  The 10-year ASCVD risk score Denman George DC Jr., et al., 2013) is: 15.8%  ---------------------------------------------------------------------------------------------------  Hypothyroid, follow-up  Lab Results  Component Value Date   TSH 2.250 09/11/2019   TSH 2.630 04/06/2019   TSH 3.470 04/30/2018   Wt Readings from Last 3 Encounters:  06/09/20 282 lb (127.9 kg)  03/10/20 (!) 275 lb (124.7 kg)  02/10/20 275 lb (124.7 kg)    She was last seen for hypothyroid 1 year ago.  Management since that visit includes continue same medication. She reports good compliance with treatment. She is not having side effects.   Symptoms: No change in energy level No constipation  No diarrhea No heat / cold intolerance  No nervousness No palpitations  No weight changes    -----------------------------------------------------------------------------------------  Prediabetes, Follow-up  Lab Results  Component Value Date   HGBA1C 5.5 09/11/2019   HGBA1C 5.8 (H) 04/06/2019   HGBA1C 5.8 (H) 04/30/2018   GLUCOSE 86 09/11/2019   GLUCOSE 98 04/06/2019   GLUCOSE 93 04/30/2018    Last seen for for this1 year ago.      Management since that visit includes discussing the importance of     healthy weight management Discussed diet and exercise . Current symptoms  include none and have been stable.  Prior visit with dietician: no Current diet: not asked Current exercise: none  Pertinent Labs:    Component Value Date/Time   CHOL 180 09/11/2019 1212   TRIG 137 09/11/2019 1212   CHOLHDL 4.3 09/11/2019 1212   CREATININE 1.01 (H) 09/11/2019 1212    Wt Readings from Last 3 Encounters:  06/09/20 282 lb (127.9 kg)  03/10/20 (!) 275 lb (124.7 kg)  02/10/20 275 lb (124.7 kg)    -----------------------------------------------------------------------------------------  Social History   Tobacco Use  . Smoking status: Never Smoker  . Smokeless tobacco: Never Used  Vaping Use  . Vaping Use: Never used  Substance Use Topics  . Alcohol use: Not Currently  . Drug use: No       Medications: Outpatient Medications Prior to Visit  Medication Sig  . acetaminophen (TYLENOL) 650 MG CR tablet Take 650 mg by mouth in the morning and at bedtime.   Marland Kitchen aspirin EC 81 MG tablet Take 81 mg by mouth every other day.   Marland Kitchen atorvastatin (LIPITOR) 10 MG tablet TAKE 1 TABLET BY MOUTH EVERY DAY  . bisoprolol-hydrochlorothiazide (ZIAC) 5-6.25 MG tablet Take 1 tablet by mouth once daily  . diclofenac (VOLTAREN) 75 MG EC tablet Take 1 tablet (75 mg total) by mouth 2 (two) times daily.  . diclofenac Sodium (VOLTAREN) 1 % GEL Apply topically in the morning and at bedtime.   Janann August 75 MCG tablet Take 1 tablet by mouth once daily  . famotidine (PEPCID) 20 MG tablet Take 1 tablet by mouth twice daily  . fluticasone (FLONASE) 50 MCG/ACT nasal spray Use 2 spray(s) in each nostril once daily  . gabapentin (NEURONTIN) 100 MG capsule TAKE 1 CAPSULE BY MOUTH THREE TIMES A DAY  . loratadine (CLARITIN) 10 MG tablet Take 10 mg by mouth daily as needed for allergies.  . Multiple Vitamins-Minerals (MULTIVITAL PO) Take by mouth daily. When remembers   No facility-administered medications prior to visit.    Review of Systems  Constitutional: Negative for appetite change,  chills, fatigue and fever.  Respiratory: Negative for chest tightness and shortness of breath.   Cardiovascular: Negative for chest pain and palpitations.  Gastrointestinal: Negative for abdominal pain, nausea and vomiting.  Musculoskeletal: Positive for arthralgias (knee pain).  Neurological: Negative for dizziness and weakness.       Objective    BP 110/74 (BP Location: Right Arm, Patient Position: Sitting, Cuff Size: Large)   Pulse 67   Temp 98.2 F (36.8 C) (Oral)   Resp 18   Wt 282 lb (127.9 kg)   BMI 41.64 kg/m     Physical Exam Vitals reviewed.  Constitutional:      General: She is not in acute distress.    Appearance: Normal appearance. She is well-developed. She is not diaphoretic.  HENT:     Head: Normocephalic and atraumatic.  Eyes:     General: No scleral icterus.    Conjunctiva/sclera: Conjunctivae normal.  Neck:     Thyroid: No thyromegaly.  Cardiovascular:     Rate and Rhythm: Normal rate and regular rhythm.     Pulses: Normal pulses.     Heart sounds: Normal heart sounds. No murmur heard.   Pulmonary:     Effort: Pulmonary effort is normal. No respiratory distress.     Breath sounds: Normal breath sounds. No wheezing, rhonchi or rales.  Musculoskeletal:     Cervical back: Neck supple.     Right lower leg: No edema.     Left lower leg: No edema.  Lymphadenopathy:     Cervical: No cervical adenopathy.  Skin:    General: Skin is warm and dry.     Findings: No rash.  Neurological:     Mental Status: She is alert and oriented to person, place, and time. Mental status is at baseline.  Psychiatric:        Mood and Affect: Mood normal.        Behavior: Behavior normal.       No results found for any visits on 06/09/20.  Assessment & Plan     Problem List Items Addressed This Visit      Cardiovascular and Mediastinum   Essential (primary) hypertension - Primary    Well controlled Continue current medications Recheck metabolic panel F/u in 6  months       Relevant Orders   Comprehensive metabolic panel     Endocrine   Adult hypothyroidism    Previously well controlled Continue Synthroid at current dose  Recheck TSH and adjust Synthroid as indicated        Relevant Orders   TSH     Other   HLD (hyperlipidemia)    Previously above goal Continue statin Repeat non-fasting LP and CMP Goal LDL < 130       Relevant Orders   Lipid panel   Comprehensive metabolic panel   Prediabetes    Recheck A1c Encourage low carb diet      Relevant Orders   Hemoglobin A1c   Morbid obesity (HCC)    Discussed importance of healthy weight management Discussed diet and exercise       Relevant Orders   Hemoglobin A1c   Lipid panel   Comprehensive metabolic panel   TSH    Other Visit Diagnoses    BMI 40.0-44.9, adult (HCC)       Need for influenza vaccination       Relevant Orders   Flu Vaccine QUAD High Dose IM (Fluad) (Completed)       Return in about 6 months (around 12/08/2020) for CPE, AWV.      I, Shirlee Latch, MD, have reviewed all documentation for this visit. The documentation on 06/09/20 for the exam, diagnosis, procedures, and orders are all accurate and complete.   Hadasah Brugger, Marzella Schlein, MD, MPH [facility]

## 2020-06-09 NOTE — Assessment & Plan Note (Signed)
Discussed importance of healthy weight management Discussed diet and exercise  

## 2020-06-09 NOTE — Assessment & Plan Note (Signed)
Well controlled Continue current medications Recheck metabolic panel F/u in 6 months  

## 2020-06-09 NOTE — Assessment & Plan Note (Signed)
Previously well controlled Continue Synthroid at current dose  Recheck TSH and adjust Synthroid as indicated   

## 2020-06-10 LAB — LIPID PANEL
Chol/HDL Ratio: 4.2 ratio (ref 0.0–4.4)
Cholesterol, Total: 179 mg/dL (ref 100–199)
HDL: 43 mg/dL (ref >39)
LDL Chol Calc (NIH): 109 mg/dL — ABNORMAL HIGH (ref 0–99)
Triglycerides: 153 mg/dL — ABNORMAL HIGH (ref 0–149)
VLDL Cholesterol Cal: 27 mg/dL (ref 5–40)

## 2020-06-10 LAB — COMPREHENSIVE METABOLIC PANEL
ALT: 11 IU/L (ref 0–32)
AST: 12 IU/L (ref 0–40)
Albumin/Globulin Ratio: 2.1 (ref 1.2–2.2)
Albumin: 4.4 g/dL (ref 3.7–4.7)
Alkaline Phosphatase: 171 IU/L — ABNORMAL HIGH (ref 44–121)
BUN/Creatinine Ratio: 20 (ref 12–28)
BUN: 25 mg/dL (ref 8–27)
Bilirubin Total: 0.3 mg/dL (ref 0.0–1.2)
CO2: 24 mmol/L (ref 20–29)
Calcium: 10.2 mg/dL (ref 8.7–10.3)
Chloride: 103 mmol/L (ref 96–106)
Creatinine, Ser: 1.23 mg/dL — ABNORMAL HIGH (ref 0.57–1.00)
GFR calc Af Amer: 50 mL/min/{1.73_m2} — ABNORMAL LOW (ref >59)
GFR calc non Af Amer: 43 mL/min/{1.73_m2} — ABNORMAL LOW (ref >59)
Globulin, Total: 2.1 g/dL (ref 1.5–4.5)
Glucose: 101 mg/dL — ABNORMAL HIGH (ref 65–99)
Potassium: 4.3 mmol/L (ref 3.5–5.2)
Sodium: 143 mmol/L (ref 134–144)
Total Protein: 6.5 g/dL (ref 6.0–8.5)

## 2020-06-10 LAB — TSH: TSH: 3.1 u[IU]/mL (ref 0.450–4.500)

## 2020-06-10 LAB — HEMOGLOBIN A1C
Est. average glucose Bld gHb Est-mCnc: 114 mg/dL
Hgb A1c MFr Bld: 5.6 % (ref 4.8–5.6)

## 2020-06-20 ENCOUNTER — Telehealth: Payer: Self-pay | Admitting: Student in an Organized Health Care Education/Training Program

## 2020-06-20 NOTE — Telephone Encounter (Signed)
Patient called to see if ok to have procedure / had flu shot 13 days ago on procedure day. Dr. Cherylann Ratel says ok to keep appt.

## 2020-06-22 ENCOUNTER — Ambulatory Visit (HOSPITAL_BASED_OUTPATIENT_CLINIC_OR_DEPARTMENT_OTHER): Payer: Medicare PPO | Admitting: Student in an Organized Health Care Education/Training Program

## 2020-06-22 ENCOUNTER — Encounter: Payer: Self-pay | Admitting: Student in an Organized Health Care Education/Training Program

## 2020-06-22 ENCOUNTER — Ambulatory Visit
Admission: RE | Admit: 2020-06-22 | Discharge: 2020-06-22 | Disposition: A | Discharge status: Home / Self Care | Payer: Medicare PPO | Source: Ambulatory Visit | Attending: Student in an Organized Health Care Education/Training Program | Admitting: Student in an Organized Health Care Education/Training Program

## 2020-06-22 ENCOUNTER — Other Ambulatory Visit: Payer: Self-pay

## 2020-06-22 DIAGNOSIS — M170 Bilateral primary osteoarthritis of knee: Secondary | ICD-10-CM | POA: Insufficient documentation

## 2020-06-22 DIAGNOSIS — G8929 Other chronic pain: Secondary | ICD-10-CM | POA: Diagnosis not present

## 2020-06-22 DIAGNOSIS — M25561 Pain in right knee: Secondary | ICD-10-CM | POA: Insufficient documentation

## 2020-06-22 DIAGNOSIS — M25562 Pain in left knee: Secondary | ICD-10-CM | POA: Diagnosis not present

## 2020-06-22 MED ORDER — LIDOCAINE HCL 2 % IJ SOLN
20.0000 mL | Freq: Once | INTRAMUSCULAR | Status: AC
  Administered 2020-06-22: 400 mg

## 2020-06-22 MED ORDER — ROPIVACAINE HCL 2 MG/ML IJ SOLN
9.0000 mL | Freq: Once | INTRAMUSCULAR | Status: AC
  Administered 2020-06-22: 10 mL via PERINEURAL

## 2020-06-22 MED ORDER — DEXAMETHASONE SODIUM PHOSPHATE 10 MG/ML IJ SOLN
10.0000 mg | Freq: Once | INTRAMUSCULAR | Status: AC
  Administered 2020-06-22: 10 mg

## 2020-06-22 MED ORDER — ROPIVACAINE HCL 2 MG/ML IJ SOLN
INTRAMUSCULAR | Status: AC
  Filled 2020-06-22: qty 20

## 2020-06-22 MED ORDER — DEXAMETHASONE SODIUM PHOSPHATE 10 MG/ML IJ SOLN
INTRAMUSCULAR | Status: AC
  Filled 2020-06-22: qty 2

## 2020-06-22 NOTE — Patient Instructions (Signed)

## 2020-06-22 NOTE — Progress Notes (Signed)
PROVIDER NOTE: Information contained herein reflects review and annotations entered in association with encounter. Interpretation of such information and data should be left to medically-trained personnel. Information provided to patient can be located elsewhere in the medical record under "Patient Instructions". Document created using STT-dictation technology, any transcriptional errors that may result from process are unintentional.    Patient: Tara Potter  Service Category: Procedure  Provider: Edward Jolly, MD  DOB: [birth year]  DOS: 06/22/2020  Location: [facility]  MRN: [id number]  Setting: Ambulatory - outpatient  Referring Provider: Edward Jolly, MD  Type: Established Patient  Specialty: Interventional Pain Management  PCP: Erasmo Downer, MD   Primary Reason for Visit: Interventional Pain Management Treatment. CC: Knee Pain  Procedure:          Anesthesia, Analgesia, Anxiolysis:  Type: Genicular Nerves Block (Superolateral, Superomedial, and Inferomedial Genicular Nerves) #2  CPT: 64450      Primary Purpose: Therapeutic Region: Lateral, Anterior, and Medial aspects of the knee joint, above and below the knee joint proper. Level: Superior and inferior to the knee joint. Target Area: For Genicular Nerve block(s), the targets are: the superolateral genicular nerve, located in the lateral distal portion of the femoral shaft as it curves to form the lateral epicondyle, in the region of the distal femoral metaphysis; the superomedial genicular nerve, located in the medial distal portion of the femoral shaft as it curves to form the medial epicondyle; and the inferomedial genicular nerve, located in the medial, proximal portion of the tibial shaft, as it curves to form the medial epicondyle, in the region of the proximal tibial metaphysis. Approach: Anterior, percutaneous, ipsilateral approach. Laterality: Bilateral  Type: Local Anesthesia  Local Anesthetic: Lidocaine  1-2%  Position: Modified Fowler's position with pillows under the targeted knee(s).   Indications: 1. Bilateral primary osteoarthritis of knee   2. Chronic pain of both knees    Pain Score: Pre-procedure: 3 /10 Post-procedure: 0-No pain/10   Pre-op Assessment:  Ms. Bogdan is a 74 y.o. (year old), female patient, seen today for interventional treatment. She  has a past surgical history that includes Tubal ligation and Dilation and curettage of uterus. Ms. Digangi has a current medication list which includes the following prescription(s): acetaminophen, aspirin ec, atorvastatin, bisoprolol-hydrochlorothiazide, diclofenac, diclofenac sodium, euthyrox, famotidine, fluticasone, gabapentin, loratadine, and multiple vitamins-minerals. Her primarily concern today is the Knee Pain  Initial Vital Signs:  Pulse/HCG Rate: 62ECG Heart Rate: 62 Temp: 97.8 F (36.6 C) Resp: 18 BP: (!) 160/74 SpO2: 98 %  BMI: Estimated body mass index is 39.87 kg/m as calculated from the following:   Height as of this encounter: 5\' 9"  (1.753 m).   Weight as of this encounter: 270 lb (122.5 kg).  Risk Assessment: Allergies: Reviewed. She is allergic to amoxicillin, entex t [pseudoephedrine-guaifenesin], and erythromycin.  Allergy Precautions: None required Coagulopathies: Reviewed. None identified.  Blood-thinner therapy: None at this time Active Infection(s): Reviewed. None identified. Ms. Batty is afebrile  Site Confirmation: Ms. Zorn was asked to confirm the procedure and laterality before marking the site Procedure checklist: Completed Consent: Before the procedure and under the influence of no sedative(s), amnesic(s), or anxiolytics, the patient was informed of the treatment options, risks and possible complications. To fulfill our ethical and legal obligations, as recommended by the American Medical Association's Code of Ethics, I have informed the patient of my clinical impression; the nature and purpose of the  treatment or procedure; the risks, benefits, and possible complications of the intervention;  the alternatives, including doing nothing; the risk(s) and benefit(s) of the alternative treatment(s) or procedure(s); and the risk(s) and benefit(s) of doing nothing. The patient was provided information about the general risks and possible complications associated with the procedure. These may include, but are not limited to: failure to achieve desired goals, infection, bleeding, organ or nerve damage, allergic reactions, paralysis, and death. In addition, the patient was informed of those risks and complications associated to the procedure, such as failure to decrease pain; infection; bleeding; organ or nerve damage with subsequent damage to sensory, motor, and/or autonomic systems, resulting in permanent pain, numbness, and/or weakness of one or several areas of the body; allergic reactions; (i.e.: anaphylactic reaction); and/or death. Furthermore, the patient was informed of those risks and complications associated with the medications. These include, but are not limited to: allergic reactions (i.e.: anaphylactic or anaphylactoid reaction(s)); adrenal axis suppression; blood sugar elevation that in diabetics may result in ketoacidosis or comma; water retention that in patients with history of congestive heart failure may result in shortness of breath, pulmonary edema, and decompensation with resultant heart failure; weight gain; swelling or edema; medication-induced neural toxicity; particulate matter embolism and blood vessel occlusion with resultant organ, and/or nervous system infarction; and/or aseptic necrosis of one or more joints. Finally, the patient was informed that Medicine is not an exact science; therefore, there is also the possibility of unforeseen or unpredictable risks and/or possible complications that may result in a catastrophic outcome. The patient indicated having understood very clearly. We  have given the patient no guarantees and we have made no promises. Enough time was given to the patient to ask questions, all of which were answered to the patient's satisfaction. Ms. Sedalia MutaCox has indicated that she wanted to continue with the procedure. Attestation: I, the ordering provider, attest that I have discussed with the patient the benefits, risks, side-effects, alternatives, likelihood of achieving goals, and potential problems during recovery for the procedure that I have provided informed consent. Date   Time: 06/22/2020 11:12 AM  Pre-Procedure Preparation:  Monitoring: As per clinic protocol. Respiration, ETCO2, SpO2, BP, heart rate and rhythm monitor placed and checked for adequate function Safety Precautions: Patient was assessed for positional comfort and pressure points before starting the procedure. Time-out: I initiated and conducted the "Time-out" before starting the procedure, as per protocol. The patient was asked to participate by confirming the accuracy of the "Time Out" information. Verification of the correct person, site, and procedure were performed and confirmed by me, the nursing staff, and the patient. "Time-out" conducted as per Joint Commission's Universal Protocol (UP.01.01.01). Time: 1157  Description of Procedure:          Area Prepped: Entire knee area, from mid-thigh to mid-shin, lateral, anterior, and medial aspects. DuraPrep (Iodine Povacrylex [0.7% available iodine] and Isopropyl Alcohol, 74% w/w) Safety Precautions: Aspiration looking for blood return was conducted prior to all injections. At no point did we inject any substances, as a needle was being advanced. No attempts were made at seeking any paresthesias. Safe injection practices and needle disposal techniques used. Medications properly checked for expiration dates. SDV (single dose vial) medications used. Description of the Procedure: Protocol guidelines were followed. The patient was placed in position  over the procedure table. The target area was identified and the area prepped in the usual manner. Skin & deeper tissues infiltrated with local anesthetic. Appropriate amount of time allowed to pass for local anesthetics to take effect. The procedure needles were then advanced to the  target area. Proper needle placement secured. Negative aspiration confirmed. Solution injected in intermittent fashion, asking for systemic symptoms every 0.5cc of injectate. The needles were then removed and the area cleansed, making sure to leave some of the prepping solution back to take advantage of its long term bactericidal properties.  Vitals:   06/22/20 1203 06/22/20 1209 06/22/20 1213 06/22/20 1218  BP: (!) 165/93 (!) 156/95 (!) 168/94 109/68  Pulse:      Resp: 20 20 15 20   Temp:      TempSrc:      SpO2: 96% 95% 100% 97%  Weight:      Height:        Start Time: 1157 hrs. End Time: 1217 hrs. Materials:  Needle(s) Type: Spinal Needle Gauge: 25G Length: 3.5-in Medication(s): Please see orders for medications and dosing details. 6 cc solution made of 5 cc of 0.2% ropivacaine, 1 cc of Decadron 10 mg/cc.  2 cc injected at each level above for the left knee 6 cc solution made of 5 cc of 0.2% ropivacaine, 1 cc of Decadron 10 mg/cc.  2 cc injected at each level above for the right knee Imaging Guidance (Non-Spinal):          Type of Imaging Technique: Fluoroscopy Guidance (Non-Spinal) Indication(s): Assistance in needle guidance and placement for procedures requiring needle placement in or near specific anatomical locations not easily accessible without such assistance. Exposure Time: Please see nurses notes. Contrast: None used. Fluoroscopic Guidance: I was personally present during the use of fluoroscopy. "Tunnel Vision Technique" used to obtain the best possible view of the target area. Parallax error corrected before commencing the procedure. "Direction-depth-direction" technique used to introduce the  needle under continuous pulsed fluoroscopy. Once target was reached, antero-posterior, oblique, and lateral fluoroscopic projection used confirm needle placement in all planes. Images permanently stored in EMR. Interpretation: No contrast injected. I personally interpreted the imaging intraoperatively. Adequate needle placement confirmed in multiple planes. Permanent images saved into the patient's record.  Antibiotic Prophylaxis:   Anti-infectives (From admission, onward)   None     Indication(s): None identified  Post-operative Assessment:  Post-procedure Vital Signs:  Pulse/HCG Rate: 6261 Temp: 97.8 F (36.6 C) Resp: 20 BP: 109/68 SpO2: 97 %  EBL: None  Complications: No immediate post-treatment complications observed by team, or reported by patient.  Note: The patient tolerated the entire procedure well. A repeat set of vitals were taken after the procedure and the patient was kept under observation following institutional policy, for this type of procedure. Post-procedural neurological assessment was performed, showing return to baseline, prior to discharge. The patient was provided with post-procedure discharge instructions, including a section on how to identify potential problems. Should any problems arise concerning this procedure, the patient was given instructions to immediately contact , at any time, without hesitation. In any case, we plan to contact the patient by telephone for a follow-up status report regarding this interventional procedure.  Comments:  No additional relevant information.  Plan of Care  Orders:  Orders Placed This Encounter  Procedures   DG PAIN CLINIC C-ARM 1-60 MIN NO REPORT    Intraoperative interpretation by procedural physician at [facility].    Standing Status:   Standing    Number of Occurrences:   1    Order Specific Question:   Reason for exam:    Answer:   Assistance in needle guidance and placement for procedures  requiring needle placement in or near specific anatomical locations not easily accessible  without such assistance.   Medications ordered for procedure: Meds ordered this encounter  Medications   lidocaine (XYLOCAINE) 2 % (with pres) injection 400 mg   ropivacaine (PF) 2 mg/mL (0.2%) (NAROPIN) injection 9 mL   dexamethasone (DECADRON) injection 10 mg   dexamethasone (DECADRON) injection 10 mg   Medications administered: We administered lidocaine, ropivacaine (PF) 2 mg/mL (0.2%), dexamethasone, and dexamethasone.  See the medical record for exact dosing, route, and time of administration.  Follow-up plan:   Return in about 4 weeks (around 07/20/2020) for Post Procedure Evaluation, virtual (discuss RFA).      Bilateral genicular nerve block 02/10/2020, 06/22/20, consider RFA next   Recent Visits No visits were found meeting these conditions. Showing recent visits within past 90 days and meeting all other requirements Today's Visits Date Type Provider Dept  06/22/20 Procedure visit Edward Jolly, MD [facility]  Showing today's visits and meeting all other requirements Future Appointments Date Type Provider Dept  07/25/20 Appointment Edward Jolly, MD [facility]  Showing future appointments within next 90 days and meeting all other requirements  Disposition: Discharge home  Discharge (Date   Time): 06/22/2020; 1230 hrs.   Primary Care Physician: Erasmo Downer, MD Location: [facility] Note by: Edward Jolly, MD Date: 06/22/2020; Time: 2:37 PM  Disclaimer:  Medicine is not an exact science. The only guarantee in medicine is that nothing is guaranteed. It is important to note that the decision to proceed with this intervention was based on the information collected from the patient. The Data and conclusions were drawn from the patient's questionnaire, the interview, and the physical examination. Because the information was  provided in large part by the patient, it cannot be guaranteed that it has not been purposely or unconsciously manipulated. Every effort has been made to obtain as much relevant data as possible for this evaluation. It is important to note that the conclusions that lead to this procedure are derived in large part from the available data. Always take into account that the treatment will also be dependent on availability of resources and existing treatment guidelines, considered by other Pain Management Practitioners as being common knowledge and practice, at the time of the intervention. For Medico-Legal purposes, it is also important to point out that variation in procedural techniques and pharmacological choices are the acceptable norm. The indications, contraindications, technique, and results of the above procedure should only be interpreted and judged by a Board-Certified Interventional Pain Specialist with extensive familiarity and expertise in the same exact procedure and technique.

## 2020-06-22 NOTE — Progress Notes (Signed)
Safety precautions to be maintained throughout the outpatient stay will include: orient to surroundings, keep bed in low position, maintain call bell within reach at all times, provide assistance with transfer out of bed and ambulation.  

## 2020-06-23 ENCOUNTER — Telehealth: Payer: Self-pay

## 2020-06-23 NOTE — Telephone Encounter (Signed)
POst procedure follow up.  Patient states she is doing great.

## 2020-06-27 ENCOUNTER — Other Ambulatory Visit: Payer: Self-pay | Admitting: Family Medicine

## 2020-07-13 ENCOUNTER — Other Ambulatory Visit: Payer: Self-pay

## 2020-07-13 ENCOUNTER — Ambulatory Visit (INDEPENDENT_AMBULATORY_CARE_PROVIDER_SITE_OTHER): Payer: Medicare PPO

## 2020-07-13 DIAGNOSIS — Z23 Encounter for immunization: Secondary | ICD-10-CM | POA: Diagnosis not present

## 2020-07-25 ENCOUNTER — Ambulatory Visit
Payer: Medicare PPO | Attending: Student in an Organized Health Care Education/Training Program | Admitting: Student in an Organized Health Care Education/Training Program

## 2020-07-25 ENCOUNTER — Encounter: Payer: Self-pay | Admitting: Student in an Organized Health Care Education/Training Program

## 2020-07-25 ENCOUNTER — Other Ambulatory Visit: Payer: Self-pay

## 2020-07-25 DIAGNOSIS — M25562 Pain in left knee: Secondary | ICD-10-CM

## 2020-07-25 DIAGNOSIS — G894 Chronic pain syndrome: Secondary | ICD-10-CM

## 2020-07-25 DIAGNOSIS — M25561 Pain in right knee: Secondary | ICD-10-CM | POA: Diagnosis not present

## 2020-07-25 DIAGNOSIS — M170 Bilateral primary osteoarthritis of knee: Secondary | ICD-10-CM | POA: Diagnosis not present

## 2020-07-25 DIAGNOSIS — G8929 Other chronic pain: Secondary | ICD-10-CM | POA: Diagnosis not present

## 2020-07-25 NOTE — Progress Notes (Signed)
Patient: Tara Potter  Service Category: E/M  Provider: Gillis Santa, MD  DOB: [birth year]  DOS: 07/25/2020  Location: Office  MRN: [id number]  Setting: Ambulatory outpatient  Referring Provider: Virginia Crews, MD  Type: Established Patient  Specialty: Interventional Pain Management  PCP: Virginia Crews, MD  Location: Home  Delivery: TeleHealth     Virtual Encounter - Pain Management PROVIDER NOTE: Information contained herein reflects review and annotations entered in association with encounter. Interpretation of such information and data should be left to medically-trained personnel. Information provided to patient can be located elsewhere in the medical record under "Patient Instructions". Document created using STT-dictation technology, any transcriptional errors that may result from process are unintentional.    Contact & Pharmacy Preferred: [phone] Home: [phone] (home) Mobile: [phone] (mobile) E-mail: [email or web address]  [facility] [place], [place] Rab[place] - [place] [address] Phone: [phone] Fax: [phone]  [phone] [place] [address]: [phone]: [phone] [facility] [address]: [phone]: [address] [address]: [phone]: [phone]  Pre-screening  Ms. Dawson offered "in-person" vs "virtual" encounter. She indicated preferring virtual for this encounter.   Reason COVID-19*  Social distancing based on CDC and AMA recommendations.   I contacted JNonnie PickneyCox on 07/25/2020 via telephone.      I clearly identified myself as B12/26/2021 MD. I verified that I was speaking with the correct person using two identifiers (Name: JJORDIS REPETTO and date of  birth: 624-Nov-1947.  Consent I sought verbal advanced consent from JKellerfor virtual visit interactions. I informed Ms. Ey of possible security and privacy concerns, risks, and limitations associated with providing "not-in-person" medical evaluation and management services. I also informed Ms. Blaylock of the availability of "in-person" appointments. Finally, I informed her that there would be a charge for the virtual visit and that she could be  personally, fully or partially, financially responsible for it. Ms. CDearingexpressed understanding and agreed to proceed.   Historic Elements   Ms. JBRISSA ASANTEis a 74y.o. year old, female patient evaluated today after our last contact on 06/22/2020. Ms. CLeclere has a past medical history of Arthritis, Hyperlipidemia, and Hypertension. She also  has a past surgical history that includes Tubal ligation and Dilation and curettage of uterus. Ms. CFrittshas a current medication list which includes the following prescription(s): acetaminophen, aspirin ec, atorvastatin, bisoprolol-hydrochlorothiazide, diclofenac, diclofenac sodium, euthyrox, famotidine, fluticasone, gabapentin, loratadine, and multiple vitamins-minerals. She  reports that she has never smoked. She has never used smokeless tobacco. She reports previous alcohol use. She reports that she does not use drugs. Ms. CRyndersis allergic to amoxicillin, entex t [pseudoephedrine-guaifenesin], and erythromycin.   HPI  Today, she is being contacted for a post-procedure assessment.   Post-Procedure Evaluation  Procedure (06/22/2020): Bilateral genicular nerve block #2  Sedation: Please see nurses note.  Effectiveness during initial hour after procedure(Ultra-Short Term Relief): 95 %   Local anesthetic used: Long-acting (4-6 hours) Effectiveness: Defined as any analgesic benefit obtained secondary to the administration of local anesthetics. This carries significant diagnostic value as to the etiological location, or  anatomical origin, of the pain. Duration of benefit is expected  to coincide with the duration of the local anesthetic used.  Effectiveness during initial 4-6 hours after procedure(Short-Term Relief): 95 %   Long-term benefit: Defined as any relief past the pharmacologic duration of the local anesthetics.  Effectiveness past the initial 6 hours after procedure(Long-Term Relief): 50 %   Current benefits: Defined as benefit that persist at this time.   Analgesia:  <50% better Function: Somewhat improved ROM: Back to baseline   Laboratory Chemistry Profile   Renal Lab Results  Component Value Date   BUN 25 06/09/2020   CREATININE 1.23 (H) 06/09/2020   BCR 20 06/09/2020   GFRAA 50 (L) 06/09/2020   GFRNONAA 43 (L) 06/09/2020     Hepatic Lab Results  Component Value Date   AST 12 06/09/2020   ALT 11 06/09/2020   ALBUMIN 4.4 06/09/2020   ALKPHOS 171 (H) 06/09/2020     Electrolytes Lab Results  Component Value Date   NA 143 06/09/2020   K 4.3 06/09/2020   CL 103 06/09/2020   CALCIUM 10.2 06/09/2020     Bone No results found for: VD25OH, VD125OH2TOT, ZC5885OY7, XA1287OM7, 25OHVITD1, 25OHVITD2, 25OHVITD3, TESTOFREE, TESTOSTERONE   Inflammation (CRP: Acute Phase) (ESR: Chronic Phase) No results found for: CRP, ESRSEDRATE, LATICACIDVEN     Note: Above Lab results reviewed.   Assessment  The primary encounter diagnosis was Bilateral primary osteoarthritis of knee. Diagnoses of Chronic pain of both knees and Chronic pain syndrome were also pertinent to this visit.  Plan of Care   Patient is status post 2 diagnostic genicular nerve blocks with each nerve block provided greater than 50% pain relief for at least 5 days.  We discussed next steps which could include genicular nerve radiofrequency ablation with the hopes of providing longer term pain relief.  I discussed the risks and benefits of this procedure and informed her that it is very similar to a genicular nerve block  however we use a special needle with a temperature probe at the end and we apply heat, 80 C to her respective genicular nerves for 60 seconds.  Patient would like to start with the right knee first followed by the left knee.  Plan to do this with light sedation.  Informed patient that she must bring her driver to stop eating 6 hours prior to her procedure.  Patient endorsed understanding.  Orders:  Orders Placed This Encounter  Procedures  . Radiofrequency,Genicular    Standing Status:   Future    Standing Expiration Date:   07/25/2021    Scheduling Instructions:     Side(s): RIGHT     Level(s): Superior-Lateral, Superior-Medial, and Inferior-Medial Genicular Nerve(s)     Sedation: with     Scheduling Timeframe: As soon as pre-approved    Order Specific Question:   Where will this procedure be performed?    Answer:   [facility]  . Radiofrequency,Genicular    Standing Status:   Future    Standing Expiration Date:   07/25/2021    Scheduling Instructions:     Side(s): LEFT     Level(s): Superior-Lateral, Superior-Medial, and Inferior-Medial Genicular Nerve(s)     Sedation: with     Scheduling Timeframe: 2 weeks after RIGHT    Order Specific Question:   Where will this procedure be performed?    Answer:   [facility]   Follow-up plan:   Return in about 5 weeks (around 08/29/2020) for Right GN RFA , with sedation.     Bilateral genicular nerve block  02/10/2020, 06/22/20,  RFA next    Recent Visits Date Type Provider Dept  06/22/20 Procedure visit Gillis Santa, MD [facility]  Showing recent visits within past 90 days and meeting all other requirements Today's Visits Date Type Provider Dept  07/25/20 Telemedicine Gillis Santa, MD [facility]  Showing today's visits and meeting all other requirements Future Appointments No visits were found meeting these conditions. Showing future appointments within next 90 days and meeting all other  requirements  I discussed the assessment and treatment plan with the patient. The patient was provided an opportunity to ask questions and all were answered. The patient agreed with the plan and demonstrated an understanding of the instructions.  Patient advised to call back or seek an in-person evaluation if the symptoms or condition worsens.  Duration of encounter: 30 minutes.  Note by: Gillis Santa, MD Date: 07/25/2020; Time: 3:44 PM

## 2020-08-02 ENCOUNTER — Other Ambulatory Visit: Payer: Self-pay | Admitting: Family Medicine

## 2020-08-30 ENCOUNTER — Other Ambulatory Visit: Payer: Self-pay | Admitting: Family Medicine

## 2020-08-30 DIAGNOSIS — I10 Essential (primary) hypertension: Secondary | ICD-10-CM

## 2020-09-01 ENCOUNTER — Other Ambulatory Visit: Payer: Self-pay | Admitting: Family Medicine

## 2020-09-01 NOTE — Telephone Encounter (Signed)
Requested Prescriptions  Pending Prescriptions Disp Refills  . EUTHYROX 75 MCG tablet [Pharmacy Med Name: Euthyrox 75 MCG Oral Tablet] 90 tablet 2    Sig: Take 1 tablet by mouth once daily     Endocrinology:  Hypothyroid Agents Failed - 09/01/2020 11:08 AM      Failed - TSH needs to be rechecked within 3 months after an abnormal result. Refill until TSH is due.      Passed - TSH in normal range and within 360 days    TSH  Date Value Ref Range Status  06/09/2020 3.100 0.450 - 4.500 uIU/mL Final         Passed - Valid encounter within last 12 months    Recent Outpatient Visits          2 months ago Essential (primary) hypertension   [facility], Marzella Schlein, MD   8 months ago Frequency of urination   [facility] [place], Marzella Schlein, MD   9 months ago Dysuria   [facility], Eula Fried, FNP   9 months ago Gastroesophageal reflux disease, unspecified whether esophagitis present   [facility], Marzella Schlein, MD   11 months ago Encounter for annual physical exam   [facility], Marzella Schlein, MD      Future Appointments            In 3 months Bacigalupo, Marzella Schlein, MD [facility]

## 2020-10-31 ENCOUNTER — Other Ambulatory Visit: Payer: Self-pay | Admitting: Family Medicine

## 2020-11-15 ENCOUNTER — Other Ambulatory Visit: Payer: Self-pay | Admitting: Family Medicine

## 2020-11-29 ENCOUNTER — Other Ambulatory Visit: Payer: Self-pay | Admitting: Family Medicine

## 2020-11-29 DIAGNOSIS — I10 Essential (primary) hypertension: Secondary | ICD-10-CM

## 2020-12-08 ENCOUNTER — Encounter: Payer: Self-pay | Admitting: Family Medicine

## 2020-12-15 ENCOUNTER — Ambulatory Visit: Payer: Self-pay

## 2020-12-15 NOTE — Telephone Encounter (Signed)
   JC    1        Tara Potter "Tara Potter" Female, 75 y.o., 21-Oct-1945  MRN:  [id number] Phone:  [phone] (H) ...       PCP:  Erasmo Downer, MD Coverage:  [facility] Medicare/[employer] Medicare Choice Ppo  Next Appt With Family Medicine 04/04/2021 at 2:00 PM         Message from [facility] sent at 12/15/2020 10:52 AM EDT  Pt stated she needs to know what she can take for sinus as it feels like an elephant is sitting on her face. Pt requests call back from a nurse to advise of medication she can take. Cb# [phone]    Call History   Type Contact Phone/Fax User  12/15/2020 10:50 AM EDT Phone (Incoming) Coll, [name] "Tara Potter" (Self) [phone] (H) Mcneil, Ja-Kwan   Pt. Reports she started having sinus symptoms 12/10/20. Has facial pain,sinus congestion with light green mucus. No fever. Has fatigue. Home COVID test negative. Has tried Coricidin and Mucinex without relief. Would like to know what to take and possibly an antibiotic. No virtual appointments available. Please advise pt.  Reason for Disposition . Fever present > 3 days (72 hours)  Answer Assessment - Initial Assessment Questions 1. LOCATION: "Where does it hurt?"      Under her eyes 2. ONSET: "When did the sinus pain start?"  (e.g., hours, days)      Monday 3. SEVERITY: "How bad is the pain?"   (Scale 1-10; mild, moderate or severe)   - MILD (1-3): doesn't interfere with normal activities    - MODERATE (4-7): interferes with normal activities (e.g., work or school) or awakens from sleep   - SEVERE (8-10): excruciating pain and patient unable to do any normal activities        4-5 4. RECURRENT SYMPTOM: "Have you ever had sinus problems before?" If Yes, ask: "When was the last time?" and "What happened that time?"      Yes 5. NASAL CONGESTION: "Is the nose blocked?" If Yes, ask: "Can you open it or must you breathe through the mouth?"     Blocked 6. NASAL DISCHARGE: "Do you have discharge from  your nose?" If so ask, "What color?"     Yes - light green 7. FEVER: "Do you have a fever?" If Yes, ask: "What is it, how was it measured, and when did it start?"      No 8. OTHER SYMPTOMS: "Do you have any other symptoms?" (e.g., sore throat, cough, earache, difficulty breathing)     Drainage in throat,fatigue 9. PREGNANCY: "Is there any chance you are pregnant?" "When was your last menstrual period?"     No  Protocols used: SINUS PAIN OR CONGESTION-A-AH

## 2020-12-16 NOTE — Telephone Encounter (Signed)
Patient was advised and reports that she went brought some Dayquil and Nyquil. She reports that she is feeling as little better since started taking the Dayquil and Nyquil. She also stated that if she is not feeling any better on Monday she will call the office back.

## 2020-12-16 NOTE — Telephone Encounter (Signed)
Try flonase in addition to the mucinex. If symptoms still present Monday, would consider an antibiotic. Can have virtual with [place] or [facility] virtual platform Monday or over the weekend.

## 2020-12-19 NOTE — Telephone Encounter (Signed)
Noted  

## 2020-12-27 ENCOUNTER — Other Ambulatory Visit: Payer: Self-pay | Admitting: Family Medicine

## 2020-12-27 NOTE — Telephone Encounter (Signed)
   Notes to clinic:  this script has expired  Review for continued use and refill  Requested Prescriptions  Pending Prescriptions Disp Refills   famotidine (PEPCID) 20 MG tablet [Pharmacy Med Name: Famotidine 20 MG Oral Tablet] 180 tablet 0    Sig: Take 1 tablet by mouth twice daily      Gastroenterology:  H2 Antagonists Passed - 12/27/2020  9:23 AM      Passed - Valid encounter within last 12 months    Recent Outpatient Visits           6 months ago Essential (primary) hypertension   [facility], Marzella Schlein, MD   1 year ago Frequency of urination   [facility], Marzella Schlein, MD   1 year ago Dysuria   [facility], Eula Fried, FNP   1 year ago Gastroesophageal reflux disease, unspecified whether esophagitis present   [facility], Marzella Schlein, MD   1 year ago Encounter for annual physical exam   [facility] [place], Marzella Schlein, MD       Future Appointments             In 3 months Bacigalupo, Marzella Schlein, MD [facility]              Signed Prescriptions Disp Refills   diclofenac (VOLTAREN) 75 MG EC tablet 180 tablet 0    Sig: Take 1 tablet by mouth twice daily      Analgesics:  NSAIDS Failed - 12/27/2020  9:23 AM      Failed - Cr in normal range and within 360 days    Creatinine, Ser  Date Value Ref Range Status  06/09/2020 1.23 (H) 0.57 - 1.00 mg/dL Final          Failed - HGB in normal range and within 360 days    Hemoglobin  Date Value Ref Range Status  04/30/2018 14.4 11.1 - 15.9 g/dL Final          Passed - Patient is not pregnant      Passed - Valid encounter within last 12 months    Recent Outpatient Visits           6 months ago Essential (primary) hypertension   [facility], Marzella Schlein, MD   1 year ago Frequency of urination   [facility], Marzella Schlein, MD   1 year ago  Dysuria   [facility], Eula Fried, FNP   1 year ago Gastroesophageal reflux disease, unspecified whether esophagitis present   [facility], Marzella Schlein, MD   1 year ago Encounter for annual physical exam   [facility] Bacigalupo, Marzella Schlein, MD       Future Appointments             In 3 months Bacigalupo, Marzella Schlein, MD [facility], [facility]

## 2021-02-07 ENCOUNTER — Other Ambulatory Visit: Payer: Self-pay | Admitting: Family Medicine

## 2021-02-21 DIAGNOSIS — H0288B Meibomian gland dysfunction left eye, upper and lower eyelids: Secondary | ICD-10-CM | POA: Diagnosis not present

## 2021-02-21 DIAGNOSIS — H5010 Unspecified exotropia: Secondary | ICD-10-CM | POA: Diagnosis not present

## 2021-02-21 DIAGNOSIS — H0288A Meibomian gland dysfunction right eye, upper and lower eyelids: Secondary | ICD-10-CM | POA: Diagnosis not present

## 2021-02-21 DIAGNOSIS — H2513 Age-related nuclear cataract, bilateral: Secondary | ICD-10-CM | POA: Diagnosis not present

## 2021-02-28 ENCOUNTER — Ambulatory Visit: Payer: Medicare PPO | Admitting: Internal Medicine

## 2021-02-28 ENCOUNTER — Other Ambulatory Visit: Payer: Self-pay

## 2021-02-28 ENCOUNTER — Telehealth: Payer: Self-pay

## 2021-02-28 ENCOUNTER — Encounter: Payer: Self-pay | Admitting: Internal Medicine

## 2021-02-28 VITALS — BP 128/79 | HR 75 | Temp 98.5°F | Ht 69.02 in | Wt 270.0 lb

## 2021-02-28 DIAGNOSIS — R350 Frequency of micturition: Secondary | ICD-10-CM | POA: Diagnosis not present

## 2021-02-28 MED ORDER — CIPROFLOXACIN HCL 500 MG PO TABS: 500.0000 mg | ORAL_TABLET | Freq: Two times a day (BID) | ORAL | 0 refills | Status: DC

## 2021-02-28 MED ORDER — CIPROFLOXACIN HCL 500 MG PO TABS: 500.0000 mg | ORAL_TABLET | Freq: Two times a day (BID) | ORAL | 0 refills | Status: AC

## 2021-02-28 NOTE — Progress Notes (Signed)
Ht 5' 9.02" (1.753 m)   Wt 270 lb (122.5 kg)   BMI 39.85 kg/m    Subjective:    Patient ID: Tara Potter, female    DOB: May 06, 1946, 75 y.o.   MRN: [id number]  Chief Complaint  Patient presents with  . Urinary Frequency    Took Azo last night  . Back Pain    HPI: Tara Potter is a 75 y.o. female  Urinary Frequency  This is a new problem. The current episode started yesterday. The problem occurs intermittently. The problem has been gradually worsening. There has been no fever. Associated symptoms include frequency. Pertinent negatives include no chills, discharge, flank pain, hematuria, hesitancy, nausea, possible pregnancy, sweats, urgency or vomiting.  Back Pain   Chief Complaint  Patient presents with  . Urinary Frequency    Took Azo last night  . Back Pain    Relevant past medical, surgical, family and social history reviewed and updated as indicated. Interim medical history since our last visit reviewed. Allergies and medications reviewed and updated.  Review of Systems  Constitutional:  Negative for chills.  Gastrointestinal:  Negative for nausea and vomiting.  Genitourinary:  Positive for frequency. Negative for flank pain, hematuria, hesitancy and urgency.  Musculoskeletal:  Positive for back pain.   Per HPI unless specifically indicated above     Objective:    Ht 5' 9.02" (1.753 m)   Wt 270 lb (122.5 kg)   BMI 39.85 kg/m   Wt Readings from Last 3 Encounters:  02/28/21 270 lb (122.5 kg)  06/22/20 270 lb (122.5 kg)  06/09/20 282 lb (127.9 kg)    Physical Exam Vitals and nursing note reviewed.  Constitutional:      General: She is not in acute distress.    Appearance: Normal appearance. She is not ill-appearing or diaphoretic.  Eyes:     Conjunctiva/sclera: Conjunctivae normal.  Pulmonary:     Breath sounds: No rhonchi.  Abdominal:     General: Abdomen is flat. Bowel sounds are normal.     Palpations: There is no mass.     Tenderness: There is  no guarding.  Neurological:     Mental Status: She is alert.    Results for orders placed or performed in visit on 06/09/20  Hemoglobin A1c  Result Value Ref Range   Hgb A1c MFr Bld 5.6 4.8 - 5.6 %   Est. average glucose Bld gHb Est-mCnc 114 mg/dL  Lipid panel  Result Value Ref Range   Cholesterol, Total 179 100 - 199 mg/dL   Triglycerides 696 (H) 0 - 149 mg/dL   HDL 43 >78 mg/dL   VLDL Cholesterol Cal 27 5 - 40 mg/dL   LDL Chol Calc (NIH) 938 (H) 0 - 99 mg/dL   Chol/HDL Ratio 4.2 0.0 - 4.4 ratio  Comprehensive metabolic panel  Result Value Ref Range   Glucose 101 (H) 65 - 99 mg/dL   BUN 25 8 - 27 mg/dL   Creatinine, Ser 1.01 (H) 0.57 - 1.00 mg/dL   GFR calc non Af Amer 43 (L) >59 mL/min/1.73   GFR calc Af Amer 50 (L) >59 mL/min/1.73   BUN/Creatinine Ratio 20 12 - 28   Sodium 143 134 - 144 mmol/L   Potassium 4.3 3.5 - 5.2 mmol/L   Chloride 103 96 - 106 mmol/L   CO2 24 20 - 29 mmol/L   Calcium 10.2 8.7 - 10.3 mg/dL   Total Protein 6.5 6.0 - 8.5 g/dL  Albumin 4.4 3.7 - 4.7 g/dL   Globulin, Total 2.1 1.5 - 4.5 g/dL   Albumin/Globulin Ratio 2.1 1.2 - 2.2   Bilirubin Total 0.3 0.0 - 1.2 mg/dL   Alkaline Phosphatase 171 (H) 44 - 121 IU/L   AST 12 0 - 40 IU/L   ALT 11 0 - 32 IU/L  TSH  Result Value Ref Range   TSH 3.100 0.450 - 4.500 uIU/mL        Current Outpatient Medications:  .  acetaminophen (TYLENOL) 650 MG CR tablet, Take 650 mg by mouth in the morning and at bedtime., Disp: , Rfl:  .  aspirin EC 81 MG tablet, Take 81 mg by mouth every other day. , Disp: , Rfl:  .  atorvastatin (LIPITOR) 10 MG tablet, TAKE 1 TABLET BY MOUTH EVERY DAY, Disp: 90 tablet, Rfl: 3 .  bisoprolol-hydrochlorothiazide (ZIAC) 5-6.25 MG tablet, Take 1 tablet by mouth once daily, Disp: 90 tablet, Rfl: 0 .  diclofenac (VOLTAREN) 75 MG EC tablet, Take 1 tablet by mouth twice daily, Disp: 180 tablet, Rfl: 0 .  diclofenac Sodium (VOLTAREN) 1 % GEL, Apply topically daily., Disp: , Rfl:  .   EUTHYROX 75 MCG tablet, Take 1 tablet by mouth once daily, Disp: 90 tablet, Rfl: 2 .  famotidine (PEPCID) 20 MG tablet, Take 1 tablet by mouth twice daily, Disp: 180 tablet, Rfl: 0 .  fluticasone (FLONASE) 50 MCG/ACT nasal spray, Use 2 spray(s) in each nostril once daily, Disp: 48 g, Rfl: 0 .  gabapentin (NEURONTIN) 100 MG capsule, TAKE 1 CAPSULE BY MOUTH THREE TIMES A DAY, Disp: 90 capsule, Rfl: 0 .  loratadine (CLARITIN) 10 MG tablet, Take 10 mg by mouth daily as needed for allergies., Disp: , Rfl:  .  Multiple Vitamins-Minerals (MULTIVITAL PO), Take by mouth daily. When remembers (Patient not taking: Reported on 07/25/2020), Disp: , Rfl:     Assessment & Plan:  uTI check UA.  pt is currently symptomatic for an Urinary tract infection(abd pain, burning etc), will cover with emperic abx, see med module for details.  encouraged to increase water/fluid intake.Signs and symptoms of emergency were discussed with the patient. The risks, benefits and side effects of treatment were discussed with the patient. The patient verbalized an understanding of plan, and was told to call the clinic/go to the ED if symptoms worsen at any point of time.   Problem List Items Addressed This Visit       Other   Frequency of urination - Primary   Relevant Orders   Urinalysis, Routine w reflex microscopic   Urine Culture     Orders Placed This Encounter  Procedures  . Urine Culture  . Urinalysis, Routine w reflex microscopic     No orders of the defined types were placed in this encounter.    Follow up plan: No follow-ups on file.

## 2021-02-28 NOTE — Telephone Encounter (Signed)
Copied from CRM [id number]. Topic: General - Other >> Feb 28, 2021  8:50 AM Jaquita Rector A wrote: Reason for CRM: Patient called in to inform Dr B that she have a UTI with low back pain and discomfort, frequent urination, small output with pain and just feeling awful. Took an AZO which helped a little last night but need to be seen or prescribed something today please. Can be reached at   Ph#  [phone] or [phone]

## 2021-02-28 NOTE — Telephone Encounter (Signed)
Appointment scheduled this afternoon at [facility] with Dr. Charlotta Newton. KW

## 2021-02-28 NOTE — Patient Instructions (Signed)

## 2021-02-28 NOTE — Telephone Encounter (Signed)
Called and spoke with patient who reports lower back pain and dysuria x 48hrs. Patient reports that she has tried otc Azo and Tylenol with little to no relief. I advised patient that we do not have openings in office today but I will contact [facility] to see if physician will see her in office this afternoon there. KW

## 2021-03-01 ENCOUNTER — Other Ambulatory Visit: Payer: Self-pay | Admitting: Family Medicine

## 2021-03-01 DIAGNOSIS — I10 Essential (primary) hypertension: Secondary | ICD-10-CM

## 2021-03-01 LAB — MICROSCOPIC EXAMINATION
RBC, Urine: NONE SEEN /hpf (ref 0–2)
Renal Epithel, UA: NONE SEEN /hpf

## 2021-03-01 LAB — URINALYSIS, ROUTINE W REFLEX MICROSCOPIC
Bilirubin, UA: NEGATIVE
Ketones, UA: NEGATIVE
Nitrite, UA: POSITIVE — AB
Protein,UA: NEGATIVE
RBC, UA: NEGATIVE
Specific Gravity, UA: 1.015 (ref 1.005–1.030)
Urobilinogen, Ur: 1 mg/dL (ref 0.2–1.0)
pH, UA: 5.5 (ref 5.0–7.5)

## 2021-03-03 LAB — URINE CULTURE

## 2021-04-04 ENCOUNTER — Encounter: Payer: Self-pay | Admitting: Family Medicine

## 2021-04-04 ENCOUNTER — Other Ambulatory Visit: Payer: Self-pay | Admitting: Family Medicine

## 2021-04-04 NOTE — Telephone Encounter (Signed)
LOV 06/09/2020  Pt has an appointment with you 04/06/2021   Thanks,   -Vernona Rieger

## 2021-04-04 NOTE — Telephone Encounter (Signed)
  Notes to clinic:  Patient has appt on 04/06/2021 Review for refill     Requested Prescriptions  Pending Prescriptions Disp Refills   diclofenac (VOLTAREN) 75 MG EC tablet [Pharmacy Med Name: Diclofenac Sodium 75 MG Oral Tablet Delayed Release] 180 tablet 0    Sig: Take 1 tablet by mouth twice daily     Analgesics:  NSAIDS Failed - 04/04/2021 10:50 AM      Failed - Cr in normal range and within 360 days    Creatinine, Ser  Date Value Ref Range Status  06/09/2020 1.23 (H) 0.57 - 1.00 mg/dL Final          Failed - HGB in normal range and within 360 days    Hemoglobin  Date Value Ref Range Status  04/30/2018 14.4 11.1 - 15.9 g/dL Final          Passed - Patient is not pregnant      Passed - Valid encounter within last 12 months    Recent Outpatient Visits           1 month ago Frequency of urination   [facility] Vigg, Avanti, MD   9 months ago Essential (primary) hypertension   [facility], Marzella Schlein, MD   1 year ago Frequency of urination   [facility], Marzella Schlein, MD   1 year ago Dysuria   [facility] Fl[facility], Eula Fried, FNP   1 year ago Gastroesophageal reflux disease, unspecified whether esophagitis present   [facility] Bacigalupo, Marzella Schlein, MD       Future Appointments             In 2 days Bacigalupo, Marzella Schlein, MD [facility]

## 2021-04-06 ENCOUNTER — Encounter: Payer: Self-pay | Admitting: Family Medicine

## 2021-04-06 ENCOUNTER — Ambulatory Visit (INDEPENDENT_AMBULATORY_CARE_PROVIDER_SITE_OTHER): Payer: Medicare PPO | Admitting: Family Medicine

## 2021-04-06 ENCOUNTER — Other Ambulatory Visit: Payer: Self-pay

## 2021-04-06 VITALS — BP 112/74 | HR 60 | Temp 98.0°F | Resp 16 | Ht 69.0 in | Wt 290.7 lb

## 2021-04-06 DIAGNOSIS — I10 Essential (primary) hypertension: Secondary | ICD-10-CM | POA: Diagnosis not present

## 2021-04-06 DIAGNOSIS — M170 Bilateral primary osteoarthritis of knee: Secondary | ICD-10-CM

## 2021-04-06 DIAGNOSIS — Z6841 Body Mass Index (BMI) 40.0 and over, adult: Secondary | ICD-10-CM

## 2021-04-06 DIAGNOSIS — Z1231 Encounter for screening mammogram for malignant neoplasm of breast: Secondary | ICD-10-CM

## 2021-04-06 DIAGNOSIS — E7800 Pure hypercholesterolemia, unspecified: Secondary | ICD-10-CM

## 2021-04-06 DIAGNOSIS — Z0000 Encounter for general adult medical examination without abnormal findings: Secondary | ICD-10-CM | POA: Diagnosis not present

## 2021-04-06 DIAGNOSIS — N952 Postmenopausal atrophic vaginitis: Secondary | ICD-10-CM

## 2021-04-06 DIAGNOSIS — E039 Hypothyroidism, unspecified: Secondary | ICD-10-CM | POA: Diagnosis not present

## 2021-04-06 DIAGNOSIS — R7303 Prediabetes: Secondary | ICD-10-CM | POA: Diagnosis not present

## 2021-04-06 MED ORDER — ESTRADIOL 0.1 MG/GM VA CREA: 1.0000 | TOPICAL_CREAM | VAGINAL | 12 refills | Status: AC

## 2021-04-06 NOTE — Assessment & Plan Note (Signed)
-   Chronic, previously well-controlled - Recheck TSH - Continue Euthyrox

## 2021-04-06 NOTE — Assessment & Plan Note (Signed)
-   New-onset atrophic vaginitis, poorly controlled - Start topical estrogen cream - Recommended pt. to discontinue Neosporin

## 2021-04-06 NOTE — Assessment & Plan Note (Addendum)
-   Discussed diet & exercise - Encouraged low-carb diet - Recheck A1c

## 2021-04-06 NOTE — Assessment & Plan Note (Signed)
-   Discussed diet & exercise - Discussed importance of healthy weight management - Recheck A1c, TSH, lipid panel

## 2021-04-06 NOTE — Assessment & Plan Note (Signed)
-   Chronic, progressive problem, poorly controlled - Refer back to pain management - Encouraged pt. to continue exercise as tolerable - Continue Diclofenac (oral + topical gel) prn for pain relief

## 2021-04-06 NOTE — Progress Notes (Signed)
Annual Wellness Visit     Patient: Tara Potter, Female    DOB: [birth year], 75 y.o.   MRN: [id number] Visit Date: 04/06/2021  Today's Provider: Shirlee Latch, MD   Chief Complaint  Patient presents with   Medicare Wellness   Subjective    Tara Potter is a 75 y.o. female who presents today for her Annual Wellness Visit. She reports consuming a general diet. The patient does not participate in regular exercise at present. She generally feels well. She reports sleeping well. She does have additional problems to discuss today.   HPI  Hypertension - Currently taking Bisoprolol-HCTZ; denies side effects - Denies chest pain, SOB, leg swelling, vision changes, & headaches  Hyperlipidemia - Currently taking Atorvastatin; denies side effects  Hypothyroidism - Currently taking Euthyrox; denies side effects - Denies constipation, cold intolerance, & fatigue  Osteoarthritis  - Pt. reports persistent bilateral knee pain (L > R knee) - She was previously seen by ortho who gave her steroid injections, which provided temporary relief - She states that she discontinued seeing pain management due to transportation concerns  Atrophic vaginitis - Pt. reports chronic vaginal irritation and spotting - Reports discomfort with urination, but denies urinary frequency, urgency, fevers, dysuria, & suprapubic tenderness - She has been applying Neosporin to vaginal tissue without relief  Health Maintenance - Due for annual screening mammo - Due for shingles vaccine & Tdap booster  Medications: Outpatient Medications Prior to Visit  Medication Sig   acetaminophen (TYLENOL) 650 MG CR tablet Take 650 mg by mouth in the morning and at bedtime.   aspirin EC 81 MG tablet Take 81 mg by mouth every other day.   atorvastatin (LIPITOR) 10 MG tablet TAKE 1 TABLET BY MOUTH EVERY DAY   bisoprolol-hydrochlorothiazide (ZIAC) 5-6.25 MG tablet Take 1 tablet by mouth once daily   diclofenac (VOLTAREN)  75 MG EC tablet Take 1 tablet by mouth twice daily   diclofenac Sodium (VOLTAREN) 1 % GEL Apply topically daily.   EUTHYROX 75 MCG tablet Take 1 tablet by mouth once daily   famotidine (PEPCID) 20 MG tablet Take 1 tablet by mouth twice daily (Patient taking differently: as needed.)   fluticasone (FLONASE) 50 MCG/ACT nasal spray Use 2 spray(s) in each nostril once daily   gabapentin (NEURONTIN) 100 MG capsule TAKE 1 CAPSULE BY MOUTH THREE TIMES A DAY   loratadine (CLARITIN) 10 MG tablet Take 10 mg by mouth daily as needed for allergies.   Multiple Vitamins-Minerals (MULTIVITAL PO) Take by mouth daily. When remembers   No facility-administered medications prior to visit.    Allergies  Allergen Reactions   Amoxicillin Diarrhea    Augmentin   Entex T [Pseudoephedrine-Guaifenesin]     keeps awake; pt states she still takes this   Erythromycin Diarrhea    Patient Care Team: Erasmo Downer, MD as PCP - General (Family Medicine) Dingeldein, Viviann Spare, MD as Consulting Physician (Ophthalmology)  Review of Systems  Constitutional:  Negative for activity change, appetite change, chills, fatigue and unexpected weight change.  HENT: Negative.    Eyes: Negative.  Negative for visual disturbance.  Respiratory:  Negative for chest tightness and shortness of breath.   Cardiovascular: Negative.  Negative for chest pain and leg swelling.  Gastrointestinal: Negative.   Endocrine: Negative.  Negative for cold intolerance and polyuria.  Genitourinary:  Positive for vaginal bleeding and vaginal pain. Negative for difficulty urinating, flank pain, hematuria, pelvic pain and urgency.  Musculoskeletal:  Positive for  arthralgias and gait problem. Negative for joint swelling.  Skin: Negative.   Neurological:  Positive for weakness. Negative for dizziness and light-headedness.  Psychiatric/Behavioral: Negative.          Objective    Vitals: BP 112/74 (BP Location: Left Arm, Patient Position: Sitting,  Cuff Size: Large)   Pulse 60   Temp 98 F (36.7 C) (Oral)   Resp 16   Ht 5\' 9"  (1.753 m)   Wt 290 lb 11.2 oz (131.9 kg)   BMI 42.93 kg/m     Physical Exam Constitutional:      General: She is not in acute distress.    Appearance: Normal appearance. She is obese.  HENT:     Head: Normocephalic and atraumatic.     Right Ear: External ear normal.     Left Ear: External ear normal.  Eyes:     Conjunctiva/sclera: Conjunctivae normal.  Cardiovascular:     Rate and Rhythm: Normal rate and regular rhythm.     Pulses: Normal pulses.     Heart sounds: Normal heart sounds.  Pulmonary:     Effort: Pulmonary effort is normal.     Breath sounds: Normal breath sounds.  Abdominal:     General: Abdomen is flat. Bowel sounds are normal.     Palpations: Abdomen is soft.     Tenderness: There is no right CVA tenderness or left CVA tenderness.  Musculoskeletal:        General: Tenderness present. No swelling.     Right lower leg: No edema.     Left lower leg: No edema.  Skin:    General: Skin is warm and dry.  Neurological:     Mental Status: She is alert.    Most recent functional status assessment: In your present state of health, do you have any difficulty performing the following activities: 04/06/2021  Hearing? Y  Vision? Y  Difficulty concentrating or making decisions? N  Walking or climbing stairs? Y  Dressing or bathing? N  Doing errands, shopping? N  Some recent data might be hidden   Most recent fall risk assessment: Fall Risk  04/06/2021  Falls in the past year? 0  Number falls in past yr: 0  Injury with Fall? 0  Risk for fall due to : No Fall Risks  Follow up Falls evaluation completed    Most recent depression screenings: PHQ 2/9 Scores 04/06/2021 06/22/2020  PHQ - 2 Score 2 0  PHQ- 9 Score 4 -  Exception Documentation - -   Most recent cognitive screening: 6CIT Screen 04/06/2021  What Year? 0 points  What month? 0 points  What time? 0 points  Count back  from 20 0 points  Months in reverse 0 points  Repeat phrase 4 points  Total Score 4   Most recent Audit-C alcohol use screening Alcohol Use Disorder Test (AUDIT) 04/06/2021  1. How often do you have a drink containing alcohol? 0  2. How many drinks containing alcohol do you have on a typical day when you are drinking? 0  3. How often do you have six or more drinks on one occasion? 0  AUDIT-C Score 0   A score of 3 or more in women, and 4 or more in men indicates increased risk for alcohol abuse, EXCEPT if all of the points are from question 1   No results found for any visits on 04/06/21.  Assessment & Plan     Annual wellness visit done today including the  all of the following: Reviewed patient's Family Medical History Reviewed and updated list of patient's medical providers Assessment of cognitive impairment was done Assessed patient's functional ability Established a written schedule for health screening services Health Risk Assessent Completed and Reviewed  Exercise Activities and Dietary recommendations  Goals      Reduce sugar in daily diet     Recommend cutting out all extra sugars in daily diet, no more sweets.         Immunization History  Administered Date(s) Administered   Fluad Quad(high Dose 65+) 04/06/2019, 06/09/2020   Influenza, High Dose Seasonal PF 06/22/2015, 06/07/2016, 04/11/2017, 04/28/2018   PFIZER(Purple Top)SARS-COV-2 Vaccination 09/18/2019, 10/07/2019, 07/13/2020   Pneumococcal Conjugate-13 04/11/2017   Pneumococcal Polysaccharide-23 05/18/2013   Td 12/29/2004    Health Maintenance  Topic Date Due   Zoster Vaccines- Shingrix (1 of 2) Never done   TETANUS/TDAP  12/30/2014   COVID-19 Vaccine (4 - Booster for Pfizer series) 11/11/2020   INFLUENZA VACCINE  03/13/2021   DEXA SCAN  08/13/2026 (Originally 01/11/2010)   Hepatitis C Screening  Completed   PNA vac Low Risk Adult  Completed   HPV VACCINES  Aged Out     Discussed health benefits  of physical activity, and encouraged her to engage in regular exercise appropriate for her age and condition.    Problem List Items Addressed This Visit       Cardiovascular and Mediastinum   Essential (primary) hypertension    - Chronic, well-controlled - Continue Bisoprolol-HCTZ - Recheck CMP      Relevant Orders   Comprehensive metabolic panel     Endocrine   Adult hypothyroidism    - Chronic, previously well-controlled - Recheck TSH - Continue Euthyrox      Relevant Orders   TSH     Musculoskeletal and Integument   Bilateral primary osteoarthritis of knee    - Chronic, progressive problem, poorly controlled - Refer back to pain management - Encouraged pt. to continue exercise as tolerable - Continue Diclofenac (oral + topical gel) prn for pain relief        Genitourinary   Atrophic vaginitis    - New-onset atrophic vaginitis, poorly controlled - Start topical estrogen cream - Recommended pt. to discontinue Neosporin        Other   HLD (hyperlipidemia)    - Chronic, previously stable - Recheck lipid panel & CMP - Continue Atorvastatin - Goal LDL < 130       Relevant Orders   Comprehensive metabolic panel   Lipid panel   Prediabetes    - Discussed diet & exercise - Encouraged low-carb diet - Recheck A1c      Relevant Orders   Hemoglobin A1c   Morbid obesity (HCC)    - Discussed diet & exercise - Discussed importance of healthy weight management - Recheck A1c, TSH, lipid panel      Other Visit Diagnoses     Encounter for annual wellness visit (AWV) in Medicare patient    -  Primary   - Refer for screening mammo - Recommended Shingles vaccine & Tdap booster - pt. declines vaccines at this time - Discussed diet & exercise    Encounter for annual physical exam       BMI 40.0-44.9, adult (HCC)       Encounter for screening mammogram for malignant neoplasm of breast       Relevant Orders   MM 3D SCREEN BREAST BILATERAL        Return  in  about 6 months (around 10/07/2021) for chronic disease f/u.       Queen Blossom, MS3   Patient seen along with MS3 student Queen Blossom. I personally evaluated this patient along with the student, and verified all aspects of the history, physical exam, and medical decision making as documented by the student. I agree with the student's documentation and have made all necessary edits.  Emylia Latella, Marzella Schlein, MD, MPH [facility]

## 2021-04-06 NOTE — Assessment & Plan Note (Addendum)
-   Chronic, previously stable - Recheck lipid panel & CMP - Continue Atorvastatin - Goal LDL < 130

## 2021-04-06 NOTE — Assessment & Plan Note (Signed)
-   Chronic, well-controlled - Continue Bisoprolol-HCTZ - Recheck CMP

## 2021-04-07 LAB — COMPREHENSIVE METABOLIC PANEL
ALT: 12 IU/L (ref 0–32)
AST: 15 IU/L (ref 0–40)
Albumin/Globulin Ratio: 1.9 (ref 1.2–2.2)
Albumin: 4.3 g/dL (ref 3.7–4.7)
Alkaline Phosphatase: 154 IU/L — ABNORMAL HIGH (ref 44–121)
BUN/Creatinine Ratio: 21 (ref 12–28)
BUN: 21 mg/dL (ref 8–27)
Bilirubin Total: 0.4 mg/dL (ref 0.0–1.2)
CO2: 25 mmol/L (ref 20–29)
Calcium: 10.3 mg/dL (ref 8.7–10.3)
Chloride: 100 mmol/L (ref 96–106)
Creatinine, Ser: 1.01 mg/dL — ABNORMAL HIGH (ref 0.57–1.00)
Globulin, Total: 2.3 g/dL (ref 1.5–4.5)
Glucose: 87 mg/dL (ref 65–99)
Potassium: 4.6 mmol/L (ref 3.5–5.2)
Sodium: 140 mmol/L (ref 134–144)
Total Protein: 6.6 g/dL (ref 6.0–8.5)
eGFR: 58 mL/min/{1.73_m2} — ABNORMAL LOW (ref >59)

## 2021-04-07 LAB — LIPID PANEL
Chol/HDL Ratio: 4.3 ratio (ref 0.0–4.4)
Cholesterol, Total: 177 mg/dL (ref 100–199)
HDL: 41 mg/dL (ref >39)
LDL Chol Calc (NIH): 106 mg/dL — ABNORMAL HIGH (ref 0–99)
Triglycerides: 169 mg/dL — ABNORMAL HIGH (ref 0–149)
VLDL Cholesterol Cal: 30 mg/dL (ref 5–40)

## 2021-04-07 LAB — HEMOGLOBIN A1C
Est. average glucose Bld gHb Est-mCnc: 111 mg/dL
Hgb A1c MFr Bld: 5.5 % (ref 4.8–5.6)

## 2021-04-07 LAB — TSH: TSH: 2.25 u[IU]/mL (ref 0.450–4.500)

## 2021-05-03 ENCOUNTER — Other Ambulatory Visit: Payer: Self-pay | Admitting: Family Medicine

## 2021-05-08 ENCOUNTER — Other Ambulatory Visit: Payer: Self-pay | Admitting: Family Medicine

## 2021-05-29 ENCOUNTER — Other Ambulatory Visit: Payer: Self-pay | Admitting: Family Medicine

## 2021-05-29 DIAGNOSIS — I10 Essential (primary) hypertension: Secondary | ICD-10-CM

## 2021-06-06 ENCOUNTER — Other Ambulatory Visit: Payer: Self-pay | Admitting: Family Medicine

## 2021-06-06 NOTE — Telephone Encounter (Signed)
Requested Prescriptions  Pending Prescriptions Disp Refills  . levothyroxine (SYNTHROID) 75 MCG tablet [Pharmacy Med Name: Levothyroxine Sodium 75 MCG Oral Tablet] 90 tablet 3    Sig: Take 1 tablet by mouth once daily     Endocrinology:  Hypothyroid Agents Failed - 06/06/2021 10:43 AM      Failed - TSH needs to be rechecked within 3 months after an abnormal result. Refill until TSH is due.      Passed - TSH in normal range and within 360 days    TSH  Date Value Ref Range Status  04/06/2021 2.250 0.450 - 4.500 uIU/mL Final         Passed - Valid encounter within last 12 months    Recent Outpatient Visits          2 months ago Encounter for annual wellness visit (AWV) in Medicare patient   [facility] [place], Marzella Schlein, MD   3 months ago Frequency of urination   [facility] Vigg, Avanti, MD   12 months ago Essential (primary) hypertension   [facility], Marzella Schlein, MD   1 year ago Frequency of urination   [facility] [place], Marzella Schlein, MD   1 year ago Dysuria   [facility] Flinchum, Eula Fried, FNP      Future Appointments            In 4 months Bacigalupo, Marzella Schlein, MD [facility], [facility]

## 2021-06-07 ENCOUNTER — Telehealth: Payer: Self-pay | Admitting: Family Medicine

## 2021-06-08 NOTE — Telephone Encounter (Signed)
Pt stated she has not been able to pick up her prescription and [facility] wont fill it until Dr B Robinette Haines the change, pt is completley out of medication.

## 2021-06-09 NOTE — Telephone Encounter (Signed)
Ok to use alternative brand of levothyroxine.

## 2021-06-29 ENCOUNTER — Other Ambulatory Visit: Payer: Self-pay | Admitting: Family Medicine

## 2021-06-29 NOTE — Telephone Encounter (Signed)
Requested medications are due for refill today yes  Requested medications are on the active medication list yes  Last refill 04/05/21  Last visit 04/06/21  Future visit scheduled 10/09/21  Notes to clinic hgb is from 2019 therefore failed protocol, please assess. Requested Prescriptions  Pending Prescriptions Disp Refills   diclofenac (VOLTAREN) 75 MG EC tablet [Pharmacy Med Name: Diclofenac Sodium 75 MG Oral Tablet Delayed Release] 180 tablet 0    Sig: Take 1 tablet by mouth twice daily     Analgesics:  NSAIDS Failed - 06/29/2021 11:22 AM      Failed - Cr in normal range and within 360 days    Creatinine, Ser  Date Value Ref Range Status  04/06/2021 1.01 (H) 0.57 - 1.00 mg/dL Final          Failed - HGB in normal range and within 360 days    Hemoglobin  Date Value Ref Range Status  04/30/2018 14.4 11.1 - 15.9 g/dL Final          Passed - Patient is not pregnant      Passed - Valid encounter within last 12 months    Recent Outpatient Visits           2 months ago Encounter for annual wellness visit (AWV) in Medicare patient   [facility], Marzella Schlein, MD   4 months ago Frequency of urination   [facility] Vigg, Avanti, MD   1 year ago Essential (primary) hypertension   [facility], Marzella Schlein, MD   1 year ago Frequency of urination   [facility], Marzella Schlein, MD   1 year ago Dysuria   [facility] Flinchum, Eula Fried, FNP       Future Appointments             In 3 months Bacigalupo, Marzella Schlein, MD [facility], [facility]

## 2021-06-30 NOTE — Telephone Encounter (Signed)
Pt called back to report that she needs this as soon as possible because she is leaving town and will be out next week while she is away

## 2021-06-30 NOTE — Telephone Encounter (Signed)
Requested medication (s) are due for refill today:   Yes  Requested medication (s) are on the active medication list:   Yes  Future visit scheduled:   See below request.   Pt has called back in needing it today   Last ordered: Pt going out of town today and needs this ASAP  Failed protocol because hbg not done since 2019.   Requested Prescriptions  Pending Prescriptions Disp Refills   diclofenac (VOLTAREN) 75 MG EC tablet [Pharmacy Med Name: Diclofenac Sodium 75 MG Oral Tablet Delayed Release] 180 tablet 0    Sig: Take 1 tablet by mouth twice daily     Analgesics:  NSAIDS Failed - 06/30/2021 11:14 AM      Failed - Cr in normal range and within 360 days    Creatinine, Ser  Date Value Ref Range Status  04/06/2021 1.01 (H) 0.57 - 1.00 mg/dL Final          Failed - HGB in normal range and within 360 days    Hemoglobin  Date Value Ref Range Status  04/30/2018 14.4 11.1 - 15.9 g/dL Final          Passed - Patient is not pregnant      Passed - Valid encounter within last 12 months    Recent Outpatient Visits           2 months ago Encounter for annual wellness visit (AWV) in Medicare patient   [facility], Marzella Schlein, MD   4 months ago Frequency of urination   [facility] Vigg, Avanti, MD   1 year ago Essential (primary) hypertension   [facility], Marzella Schlein, MD   1 year ago Frequency of urination   [facility] [place], Marzella Schlein, MD   1 year ago Dysuria   [facility] Flinchum, Eula Fried, FNP       Future Appointments             In 3 months Bacigalupo, Marzella Schlein, MD [facility], [facility]

## 2021-07-17 ENCOUNTER — Ambulatory Visit: Payer: Medicare PPO | Admitting: Family Medicine

## 2021-07-25 ENCOUNTER — Telehealth: Payer: Self-pay

## 2021-07-25 NOTE — Telephone Encounter (Signed)
Copied from CRM [id number]. Topic: General - Other >> Jul 25, 2021 10:35 AM Pawlus, Tara Potter wrote: Reason for CRM: Pt stated she hasn't gotten the flu shot and wanted to know if Dr B would recommend she comes in to get that done since she thinks she may have already had the flu, please advise.

## 2021-07-25 NOTE — Telephone Encounter (Signed)
Would still recommend the flu shot even if she had the flu. May protect against other strains. Want to wait until 2 weeks after recovery for shot.

## 2021-07-25 NOTE — Telephone Encounter (Signed)
LMTCB-Ok for [facility] Nurse to give patient providers message 

## 2021-07-26 NOTE — Telephone Encounter (Signed)
Patient notified of provider recommendations. Patient is satisfied and questions answered

## 2021-08-15 ENCOUNTER — Other Ambulatory Visit: Payer: Self-pay | Admitting: Family Medicine

## 2021-08-26 ENCOUNTER — Other Ambulatory Visit: Payer: Self-pay | Admitting: Family Medicine

## 2021-08-26 DIAGNOSIS — I10 Essential (primary) hypertension: Secondary | ICD-10-CM

## 2021-08-26 NOTE — Telephone Encounter (Signed)
Requested Prescriptions  Pending Prescriptions Disp Refills   bisoprolol-hydrochlorothiazide (ZIAC) 5-6.25 MG tablet [Pharmacy Med Name: Bisoprolol-hydroCHLOROthiazide 5-6.25 MG Oral Tablet] 90 tablet 0    Sig: Take 1 tablet by mouth once daily     Cardiovascular: Beta Blocker + Diuretic Combos Failed - 08/26/2021  4:31 AM      Failed - Cr in normal range and within 180 days    Creatinine, Ser  Date Value Ref Range Status  04/06/2021 1.01 (H) 0.57 - 1.00 mg/dL Final         Passed - K in normal range and within 180 days    Potassium  Date Value Ref Range Status  04/06/2021 4.6 3.5 - 5.2 mmol/L Final         Passed - Na in normal range and within 180 days    Sodium  Date Value Ref Range Status  04/06/2021 140 134 - 144 mmol/L Final         Passed - Ca in normal range and within 180 days    Calcium  Date Value Ref Range Status  04/06/2021 10.3 8.7 - 10.3 mg/dL Final         Passed - Patient is not pregnant      Passed - Last BP in normal range    BP Readings from Last 1 Encounters:  04/06/21 112/74         Passed - Last Heart Rate in normal range    Pulse Readings from Last 1 Encounters:  04/06/21 60         Passed - Valid encounter within last 6 months    Recent Outpatient Visits          4 months ago Encounter for annual wellness visit (AWV) in Medicare patient   [facility], Marzella Schlein, MD   5 months ago Frequency of urination   [facility] Vigg, Avanti, MD   1 year ago Essential (primary) hypertension   [facility], Marzella Schlein, MD   1 year ago Frequency of urination   [facility], Marzella Schlein, MD   1 year ago Dysuria   [facility] Flinchum, Eula Fried, FNP      Future Appointments            In 1 month Bacigalupo, Marzella Schlein, MD [facility], [facility]

## 2021-08-30 ENCOUNTER — Other Ambulatory Visit: Payer: Self-pay | Admitting: Family Medicine

## 2021-08-30 DIAGNOSIS — I10 Essential (primary) hypertension: Secondary | ICD-10-CM

## 2021-08-30 MED ORDER — BISOPROLOL-HYDROCHLOROTHIAZIDE 5-6.25 MG PO TABS: 1.0000 | ORAL_TABLET | Freq: Every day | ORAL | 0 refills | Status: DC

## 2021-08-30 NOTE — Telephone Encounter (Signed)
Copied from CRM [id number]. Topic: Quick Communication - Rx Refill/Question >> Aug 30, 2021 12:02 PM Gaetana Michaelis A wrote: Medication: bisoprolol-hydrochlorothiazide [facility]) 5-6.25 MG tablet [[id number]]   Has the patient contacted their pharmacy? No. This will be the patient's first time using this pharmacy  (Agent: If no, request that the patient contact the pharmacy for the refill. If patient does not wish to contact the pharmacy document the reason why and proceed with request.) (Agent: If yes, when and what did the pharmacy advise?)  Preferred Pharmacy (with phone number or street name): [id number] [place], [place] - [address] [place] [address] Phone: [phone] Fax: [phone]  Has the patient been seen for an appointment in the last year OR does the patient have an upcoming appointment? Yes.    Agent: Please be advised that RX refills may take up to 3 business days. We ask that you follow-up with your pharmacy.

## 2021-08-30 NOTE — Telephone Encounter (Signed)
Change in pharmacy- Rx sent to new pharmacy Requested Prescriptions  Pending Prescriptions Disp Refills   bisoprolol-hydrochlorothiazide (ZIAC) 5-6.25 MG tablet 90 tablet 0    Sig: Take 1 tablet by mouth daily.     Cardiovascular: Beta Blocker + Diuretic Combos Failed - 08/30/2021  2:16 PM      Failed - Cr in normal range and within 180 days    Creatinine, Ser  Date Value Ref Range Status  04/06/2021 1.01 (H) 0.57 - 1.00 mg/dL Final         Passed - K in normal range and within 180 days    Potassium  Date Value Ref Range Status  04/06/2021 4.6 3.5 - 5.2 mmol/L Final         Passed - Na in normal range and within 180 days    Sodium  Date Value Ref Range Status  04/06/2021 140 134 - 144 mmol/L Final         Passed - Ca in normal range and within 180 days    Calcium  Date Value Ref Range Status  04/06/2021 10.3 8.7 - 10.3 mg/dL Final         Passed - Patient is not pregnant      Passed - Last BP in normal range    BP Readings from Last 1 Encounters:  04/06/21 112/74         Passed - Last Heart Rate in normal range    Pulse Readings from Last 1 Encounters:  04/06/21 60         Passed - Valid encounter within last 6 months    Recent Outpatient Visits          4 months ago Encounter for annual wellness visit (AWV) in Medicare patient   [facility], Marzella Schlein, MD   6 months ago Frequency of urination   [facility] Vigg, Avanti, MD   1 year ago Essential (primary) hypertension   [facility], Marzella Schlein, MD   1 year ago Frequency of urination   [facility] [place], Marzella Schlein, MD   1 year ago Dysuria   [facility] Flinchum, Eula Fried, FNP      Future Appointments            In 1 month Bacigalupo, Marzella Schlein, MD [facility], [facility]

## 2021-09-19 ENCOUNTER — Telehealth (INDEPENDENT_AMBULATORY_CARE_PROVIDER_SITE_OTHER): Payer: Medicare PPO | Admitting: Family Medicine

## 2021-09-19 ENCOUNTER — Other Ambulatory Visit: Payer: Self-pay

## 2021-09-19 ENCOUNTER — Encounter: Payer: Self-pay | Admitting: Family Medicine

## 2021-09-19 ENCOUNTER — Telehealth: Payer: Self-pay

## 2021-09-19 DIAGNOSIS — U071 COVID-19: Secondary | ICD-10-CM | POA: Diagnosis not present

## 2021-09-19 MED ORDER — MOLNUPIRAVIR EUA 200MG CAPSULE: 4.0000 | ORAL_CAPSULE | Freq: Two times a day (BID) | ORAL | 0 refills | Status: AC

## 2021-09-19 NOTE — Telephone Encounter (Signed)
Copied from [place] [id number]. Topic: General - Inquiry >> Sep 19, 2021  9:23 AM Tara Potter, Helene Kelp D wrote: Reason for CRM: Patient called and said that she tested herself for covid 2X and they were both positive. She would like to now if Dr. B can call her in medication for her to take to [facility] [place]. If any questions to give her a call back, thanks.

## 2021-09-19 NOTE — Patient Instructions (Signed)
It was great to see you!  Our plans for today:  - See below for self-isolation guidelines. You may end your quarantine once you are 10 days from symptom onset and fever free for 24 hours without use of tylenol or ibuprofen. Wear a well-fitting N95 mask if you have to go out and about. - Take the molnpiravir as directed. See http://www.[email or web address].[email or web address] for more information about the medication. - I recommend getting vaccinated with your booster once you are healed from your current infection. - Certainly, if you are having difficulties breathing or unable to keep down fluids, go to the Emergency Department.   Take care and seek immediate care sooner if you develop any concerns.   Dr. Linwood Dibbles     Person Under Monitoring Name: Tara Potter  Location: [address] [place] [address]   Infection Prevention Recommendations for Individuals Confirmed to have, or Being Evaluated for, 2019 Novel Coronavirus (COVID-19) Infection Who Receive Care at Home  Individuals who are confirmed to have, or are being evaluated for, COVID-19 should follow the prevention steps below until a healthcare provider or local or state health department says they can return to normal activities.  Stay home except to get medical care You should restrict activities outside your home, except for getting medical care. Do not go to work, school, or public areas, and do not use public transportation or taxis.  Call ahead before visiting your doctor Before your medical appointment, call the healthcare provider and tell them that you have, or are being evaluated for, COVID-19 infection. This will help the healthcare providers office take steps to keep other people from getting infected. Ask your healthcare provider to call the local or state health department.  Monitor your symptoms Seek prompt medical attention if your illness is worsening (e.g., difficulty breathing). Before going to your  medical appointment, call the healthcare provider and tell them that you have, or are being evaluated for, COVID-19 infection. Ask your healthcare provider to call the local or state health department.  Wear a facemask You should wear a facemask that covers your nose and mouth when you are in the same room with other people and when you visit a healthcare provider. People who live with or visit you should also wear a facemask while they are in the same room with you.  Separate yourself from other people in your home As much as possible, you should stay in a different room from other people in your home. Also, you should use a separate bathroom, if available.  Avoid sharing household items You should not share dishes, drinking glasses, cups, eating utensils, towels, bedding, or other items with other people in your home. After using these items, you should wash them thoroughly with soap and water.  Cover your coughs and sneezes Cover your mouth and nose with a tissue when you cough or sneeze, or you can cough or sneeze into your sleeve. Throw used tissues in a lined trash can, and immediately wash your hands with soap and water for at least 20 seconds or use an alcohol-based hand rub.  Wash your [employer] your hands often and thoroughly with soap and water for at least 20 seconds. You can use an alcohol-based hand sanitizer if soap and water are not available and if your hands are not visibly dirty. Avoid touching your eyes, nose, and mouth with unwashed hands.   Prevention Steps for Caregivers and Household Members of Individuals Confirmed to have, or Being Evaluated for, COVID-19  Infection Being Cared for in the Home  If you live with, or provide care at home for, a person confirmed to have, or being evaluated for, COVID-19 infection please follow these guidelines to prevent infection:  Follow healthcare providers instructions Make sure that you understand and can help the  patient follow any healthcare provider instructions for all care.  Provide for the patients basic needs You should help the patient with basic needs in the home and provide support for getting groceries, prescriptions, and other personal needs.  Monitor the patients symptoms If they are getting sicker, call his or her medical provider and tell them that the patient has, or is being evaluated for, COVID-19 infection. This will help the healthcare providers office take steps to keep other people from getting infected. Ask the healthcare provider to call the local or state health department.  Limit the number of people who have contact with the patient If possible, have only one caregiver for the patient. Other household members should stay in another home or place of residence. If this is not possible, they should stay in another room, or be separated from the patient as much as possible. Use a separate bathroom, if available. Restrict visitors who do not have an essential need to be in the home.  Keep older adults, very young children, and other sick people away from the patient Keep older adults, very young children, and those who have compromised immune systems or chronic health conditions away from the patient. This includes people with chronic heart, lung, or kidney conditions, diabetes, and cancer.  Ensure good ventilation Make sure that shared spaces in the home have good air flow, such as from an air conditioner or an opened window, weather permitting.  Wash your hands often Wash your hands often and thoroughly with soap and water for at least 20 seconds. You can use an alcohol based hand sanitizer if soap and water are not available and if your hands are not visibly dirty. Avoid touching your eyes, nose, and mouth with unwashed hands. Use disposable paper towels to dry your hands. If not available, use dedicated cloth towels and replace them when they become wet.  Wear a  facemask and gloves Wear a disposable facemask at all times in the room and gloves when you touch or have contact with the patients blood, body fluids, and/or secretions or excretions, such as sweat, saliva, sputum, nasal mucus, vomit, urine, or feces.  Ensure the mask fits over your nose and mouth tightly, and do not touch it during use. Throw out disposable facemasks and gloves after using them. Do not reuse. Wash your hands immediately after removing your facemask and gloves. If your personal clothing becomes contaminated, carefully remove clothing and launder. Wash your hands after handling contaminated clothing. Place all used disposable facemasks, gloves, and other waste in a lined container before disposing them with other household waste. Remove gloves and wash your hands immediately after handling these items.  Do not share dishes, glasses, or other household items with the patient Avoid sharing household items. You should not share dishes, drinking glasses, cups, eating utensils, towels, bedding, or other items with a patient who is confirmed to have, or being evaluated for, COVID-19 infection. After the person uses these items, you should wash them thoroughly with soap and water.  Wash laundry thoroughly Immediately remove and wash clothes or bedding that have blood, body fluids, and/or secretions or excretions, such as sweat, saliva, sputum, nasal mucus, vomit, urine, or feces,  on them. Wear gloves when handling laundry from the patient. Read and follow directions on labels of laundry or clothing items and detergent. In general, wash and dry with the warmest temperatures recommended on the label.  Clean all areas the individual has used often Clean all touchable surfaces, such as counters, tabletops, doorknobs, bathroom fixtures, toilets, phones, keyboards, tablets, and bedside tables, every day. Also, clean any surfaces that may have blood, body fluids, and/or secretions or excretions  on them. Wear gloves when cleaning surfaces the patient has come in contact with. Use a diluted bleach solution (e.g., dilute bleach with 1 part bleach and 10 parts water) or a household disinfectant with a label that says EPA-registered for coronaviruses. To make a bleach solution at home, add 1 tablespoon of bleach to 1 quart (4 cups) of water. For a larger supply, add  cup of bleach to 1 gallon (16 cups) of water. Read labels of cleaning products and follow recommendations provided on product labels. Labels contain instructions for safe and effective use of the cleaning product including precautions you should take when applying the product, such as wearing gloves or eye protection and making sure you have good ventilation during use of the product. Remove gloves and wash hands immediately after cleaning.  Monitor yourself for signs and symptoms of illness Caregivers and household members are considered close contacts, should monitor their health, and will be asked to limit movement outside of the home to the extent possible. Follow the monitoring steps for close contacts listed on the symptom monitoring form.   ? If you have additional questions, contact your local health department or call the epidemiologist on call at [phone] (available 24/7). ? This guidance is subject to change. For the most up-to-date guidance from [facility], please refer to their website: [email or web address]

## 2021-09-19 NOTE — Progress Notes (Signed)
MyChart Video Visit    Virtual Visit via Video Note   This visit type was conducted due to national recommendations for restrictions regarding the COVID-19 Pandemic (e.g. social distancing) in an effort to limit this patient's exposure and mitigate transmission in our community. This patient is at least at moderate risk for complications without adequate follow up. This format is felt to be most appropriate for this patient at this time. Physical exam was limited by quality of the video and audio technology used for the visit.   Patient location: home Provider location: home  I discussed the limitations of evaluation and management by telemedicine and the availability of in person appointments. The patient expressed understanding and agreed to proceed.  Patient: Tara Potter   DOB: May 23, 1946   76 y.o. Female  MRN: [id number] Visit Date: 09/19/2021  Today's healthcare provider: Caro Laroche, DO   No chief complaint on file.  Subjective    HPI   UPPER RESPIRATORY TRACT INFECTION - COVID positive last night - symptom onset yesterday - went to funeral this weekend  Fever: yes, now resolved Cough:  occasionally Shortness of breath: no Chest pain: no Chest tightness: no Chest congestion: no Nasal congestion: yes Runny nose: yes Sore throat:  now resolved Headache:  somewhat Vomiting: no Fatigue: yes Sick contacts: yes Relief with OTC cold/cough medications:  unsure   Treatments attempted: dayquil, tylenol   Medications: Outpatient Medications Prior to Visit  Medication Sig   acetaminophen (TYLENOL) 650 MG CR tablet Take 650 mg by mouth in the morning and at bedtime.   aspirin EC 81 MG tablet Take 81 mg by mouth every other day.   atorvastatin (LIPITOR) 10 MG tablet TAKE 1 TABLET BY MOUTH EVERY DAY   bisoprolol-hydrochlorothiazide (ZIAC) 5-6.25 MG tablet Take 1 tablet by mouth daily.   diclofenac (VOLTAREN) 75 MG EC tablet Take 1 tablet by mouth twice daily    diclofenac Sodium (VOLTAREN) 1 % GEL Apply topically daily.   estradiol (ESTRACE VAGINAL) 0.1 MG/GM vaginal cream Place 1 Applicatorful vaginally 3 (three) times a week.   famotidine (PEPCID) 20 MG tablet Take 1 tablet by mouth twice daily (Patient taking differently: as needed.)   fluticasone (FLONASE) 50 MCG/ACT nasal spray Use 2 spray(s) in each nostril once daily   gabapentin (NEURONTIN) 100 MG capsule TAKE 1 CAPSULE BY MOUTH THREE TIMES A DAY   levothyroxine (SYNTHROID) 75 MCG tablet Take 1 tablet by mouth once daily   loratadine (CLARITIN) 10 MG tablet Take 10 mg by mouth daily as needed for allergies.   Multiple Vitamins-Minerals (MULTIVITAL PO) Take by mouth daily. When remembers   No facility-administered medications prior to visit.    Review of Systems per HPI    Objective    There were no vitals taken for this visit.   Physical Exam  Well appearing, in NAD. Speaks in full sentences. Comfortable WOB on RA. No resp distress.      Assessment & Plan     COVID-19 Doing well with mild sx. Is a candidate for COVID treatment, molnupiravir sent to pharmacy. Reviewed OTC symptom relief, self-quarantine guidelines, and emergency precautions.      I discussed the assessment and treatment plan with the patient. The patient was provided an opportunity to ask questions and all were answered. The patient agreed with the plan and demonstrated an understanding of the instructions.   The patient was advised to call back or seek an in-person evaluation if the symptoms worsen  or if the condition fails to improve as anticipated.  I provided 8 minutes of non-face-to-face time during this encounter.  Caro Laroche, DO [facility] [phone] (phone) [phone] (fax)  [facility]

## 2021-09-19 NOTE — Telephone Encounter (Signed)
Virtual appt scheduled for today.  °

## 2021-09-19 NOTE — Telephone Encounter (Signed)
Called and spoke with patient on the phone who reported she tested twice at home today for covid and had came back positive. Patient states that symptoms began lat night with fever high of 100.5 and complaints of runny nose and congestion. Patient states that she has been taking otc Tylenol and Dayquil. Patient uses [facility] in [place]. Please advise. KW

## 2021-09-24 ENCOUNTER — Other Ambulatory Visit: Payer: Self-pay | Admitting: Family Medicine

## 2021-10-09 ENCOUNTER — Ambulatory Visit: Payer: Medicare PPO | Admitting: Family Medicine

## 2021-10-15 ENCOUNTER — Other Ambulatory Visit: Payer: Self-pay | Admitting: Family Medicine

## 2021-10-16 ENCOUNTER — Ambulatory Visit: Payer: Self-pay | Admitting: *Deleted

## 2021-10-16 NOTE — Telephone Encounter (Signed)
Lmtcb to schedule appt for tomorrow with apps. [facility] please schedule when patient calls back. CRM created.  ?

## 2021-10-16 NOTE — Telephone Encounter (Signed)
I know we are booked but does anyone have availability today or tomorrow that we could offer? ?

## 2021-10-16 NOTE — Telephone Encounter (Signed)
?  Chief Complaint: urinary frequency and back pain , requesting appt today  ?Symptoms: minimal burning with urination, frequency, back pain noted when not taking AZO. Last AZO taken 30 minutes ago  ?Frequency: started yesterday 10/15/21 ?Pertinent Negatives: Patient denies fever, blood in urine ?Disposition: [] ED /[x] Urgent Care (no appt availability in office) / [] Appointment(In office/virtual)/ []  [facility]/ [] Home Care/ [x] Refused Recommended Disposition /[] [facility]/ []  Follow-up with PCP ?Additional Notes:  ? ?Recommended UC due to no available OV today. Patient requesting appt today and reports she will go to [place] as requested last year when office routing patient to other offices. Patient would like appt today . Requesting a call back if possible.  ? ? Reason for Disposition ? Side (flank) or lower back pain present ? ?Answer Assessment - Initial Assessment Questions ?1. SYMPTOM: "What's the main symptom you're concerned about?" (e.g., frequency, incontinence) ?    Frequency , back pain ?2. ONSET: "When did the  UTI sx   start?" ?    Yesterday  ?3. PAIN: "Is there any pain?" If Yes, ask: "How bad is it?" (Scale: 1-10; mild, moderate, severe) ?    2-3 with taking AZO and tylenol  ?4. CAUSE: "What do you think is causing the symptoms?" ?    UTI ?5. OTHER SYMPTOMS: "Do you have any other symptoms?" (e.g., fever, flank pain, blood in urine, pain with urination) ?    Back pain , urinary frequency , some burning with urination ?6. PREGNANCY: "Is there any chance you are pregnant?" "When was your last menstrual period?" ?    na ? ?Protocols used: Urinary Symptoms-A-AH ? ?

## 2021-10-17 ENCOUNTER — Encounter: Payer: Self-pay | Admitting: Physician Assistant

## 2021-10-17 ENCOUNTER — Ambulatory Visit: Payer: Medicare PPO | Admitting: Physician Assistant

## 2021-10-17 ENCOUNTER — Other Ambulatory Visit: Payer: Self-pay

## 2021-10-17 VITALS — BP 134/63 | HR 69 | Ht 69.0 in | Wt 287.1 lb

## 2021-10-17 DIAGNOSIS — N3000 Acute cystitis without hematuria: Secondary | ICD-10-CM

## 2021-10-17 DIAGNOSIS — R3989 Other symptoms and signs involving the genitourinary system: Secondary | ICD-10-CM

## 2021-10-17 DIAGNOSIS — L239 Allergic contact dermatitis, unspecified cause: Secondary | ICD-10-CM | POA: Diagnosis not present

## 2021-10-17 DIAGNOSIS — R202 Paresthesia of skin: Secondary | ICD-10-CM | POA: Diagnosis not present

## 2021-10-17 MED ORDER — CLOBETASOL PROPIONATE 0.05 % EX CREA: 1.0000 "application " | TOPICAL_CREAM | Freq: Two times a day (BID) | CUTANEOUS | 2 refills | Status: AC

## 2021-10-17 MED ORDER — SULFAMETHOXAZOLE-TRIMETHOPRIM 800-160 MG PO TABS: 1.0000 | ORAL_TABLET | Freq: Two times a day (BID) | ORAL | 0 refills | Status: AC

## 2021-10-17 NOTE — Telephone Encounter (Signed)
Requested Prescriptions  ?Pending Prescriptions Disp Refills  ?? gabapentin (NEURONTIN) 100 MG capsule [Pharmacy Med Name: GABAPENTIN 100 MG CAPSULE] 90 capsule 0  ?  Sig: TAKE 1 CAPSULE BY MOUTH THREE TIMES A DAY  ?  ? Neurology: Anticonvulsants - gabapentin Failed - 10/15/2021  6:15 PM  ?  ?  Failed - Cr in normal range and within 360 days  ?  Creatinine, Ser  ?Date Value Ref Range Status  ?04/06/2021 1.01 (H) 0.57 - 1.00 mg/dL Final  ?   ?  ?  Passed - Completed PHQ-2 or PHQ-9 in the last 360 days  ?  ?  Passed - Valid encounter within last 12 months  ?  Recent Outpatient Visits   ?      ? 4 weeks ago COVID-19  ? [facility] [place], Darl Householder, DO  ? 6 months ago Encounter for annual wellness visit (AWV) in Medicare patient  ? [facility] [place], Marzella Schlein, MD  ? 7 months ago Frequency of urination  ? [facility] [place], Avanti, MD  ? 1 year ago Essential (primary) hypertension  ? [facility] [place], Marzella Schlein, MD  ? 1 year ago Frequency of urination  ? [facility] [place], Marzella Schlein, MD  ?  ?  ?Future Appointments   ?        ? Today Alfredia Ferguson, PA-C [facility], [facility]  ? In 1 month [place], Marzella Schlein, MD [facility], [facility]  ?  ? ?  ?  ?  ? ?

## 2021-10-17 NOTE — Progress Notes (Signed)
? ?I,Sha'taria Tyson,acting as a Education administrator for [employer], PA-C.,have documented all relevant documentation on the behalf of Mikey Kirschner, PA-C,as directed by  Mikey Kirschner, PA-C while in the presence of Mikey Kirschner, PA-C. ? ?Acute Office Visit ? ?Subjective:  ? ? Patient ID: Tara Potter, female    DOB: 18-Jan-1946, 76 y.o.   MRN: [id number] ? ?HPI ?Tara Potter is 76 y/o female who presents today with concerns of urinary pressure, low back pain, urinary urgency that started x 3 days ago. Reports improvement with Azo. Denies dysuria, hematuria, frequency. Denies feeling similar to previous kidney stones--denies nausea, vomiting, abdominal pain. ? ?She also reports for the last few weeks feeling a sensation of a scraped arm on her left inner arm. Denies any visible injury or rash. Denies injury.  ? 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  ? ?Past Medical History:  ?Diagnosis Date  ? Arthritis   ? Hyperlipidemia   ? Hypertension   ? ? ?Past Surgical History:  ?Procedure Laterality Date  ? DILATION AND CURETTAGE OF UTERUS    ? TUBAL LIGATION    ? ? ?Family History  ?Problem Relation Age of Onset  ? Hypertension Mother   ? Macular degeneration Mother   ? Atrial fibrillation Mother   ? Kidney disease Mother   ? Heart disease Father   ? Arthritis Sister   ? Breast cancer Neg Hx   ? ? ?Social History  ? ?Socioeconomic History  ? Marital status: Widowed  ?  Spouse name: Not on file  ? Number of children: 2  ? Years of education: Not on file  ? Highest education level: Bachelor's degree (e.g., BA, AB, BS)  ?Occupational History  ? Occupation: retired  ?Tobacco Use  ? Smoking status: Never  ? Smokeless tobacco: Never  ?Vaping Use  ? Vaping Use: Never used  ?Substance and Sexual Activity  ? Alcohol use: Not Currently  ? Drug use: No  ? Sexual activity: Not on file  ?Other Topics Concern  ?  Not on file  ?Social History Narrative  ? Not on file  ? ?Social Determinants of Health  ? ?Financial Resource Strain: Not on file  ?Food Insecurity: Not on file  ?Transportation Needs: Not on file  ?Physical Activity: Not on file  ?Stress: Not on file  ?Social Connections: Not on file  ?Intimate Partner Violence: Not on file  ? ? ?Outpatient Medications Prior to Visit  ?Medication Sig Dispense Refill  ? acetaminophen (TYLENOL) 650 MG CR tablet Take 650 mg by mouth in the morning and at bedtime.    ? aspirin EC 81 MG tablet Take 81 mg by mouth every other day.    ? atorvastatin (LIPITOR) 10 MG tablet TAKE 1 TABLET BY MOUTH EVERY DAY 90 tablet 3  ? bisoprolol-hydrochlorothiazide (ZIAC) 5-6.25 MG tablet Take 1 tablet by mouth daily. 90 tablet 0  ? diclofenac (VOLTAREN) 75 MG EC tablet Take 1 tablet by mouth twice daily 118 tablet 0  ? diclofenac Sodium (VOLTAREN) 1 % GEL Apply topically daily.    ? estradiol (ESTRACE VAGINAL) 0.1 MG/GM vaginal cream Place 1 Applicatorful vaginally 3 (three) times a week. 42.5 g 12  ? famotidine (PEPCID) 20 MG tablet Take 1 tablet by mouth twice daily (Patient taking differently: as needed.) 180 tablet 0  ? fluticasone (FLONASE) 50 MCG/ACT nasal spray Use 2 spray(s) in each  nostril once daily 48 g 0  ? gabapentin (NEURONTIN) 100 MG capsule TAKE 1 CAPSULE BY MOUTH THREE TIMES A DAY 90 capsule 0  ? levothyroxine (SYNTHROID) 75 MCG tablet Take 1 tablet by mouth once daily 90 tablet 2  ? loratadine (CLARITIN) 10 MG tablet Take 10 mg by mouth daily as needed for allergies.    ? Multiple Vitamins-Minerals (MULTIVITAL PO) Take by mouth daily. When remembers    ? ?No facility-administered medications prior to visit.  ? ? ?Allergies  ?Allergen Reactions  ? Amoxicillin Diarrhea  ?  Augmentin  ? Entex T [Pseudoephedrine-Guaifenesin]   ?  keeps awake; pt states she still takes this  ? Erythromycin Diarrhea  ? ? ?Review of Systems  ?Constitutional:  Negative for fatigue and fever.  ?Respiratory:   Negative for cough and shortness of breath.   ?Cardiovascular:  Negative for chest pain and leg swelling.  ?Gastrointestinal:  Negative for abdominal pain.  ?Genitourinary:  Positive for urgency. Negative for hematuria.  ?Musculoskeletal:  Positive for back pain.  ?Neurological:  Negative for dizziness and headaches.  ? ?   ?Objective:  ?  ?Physical Exam ?Constitutional:   ?   General: She is awake.  ?   Appearance: She is well-developed.  ?HENT:  ?   Head: Normocephalic.  ?Eyes:  ?   Conjunctiva/sclera: Conjunctivae normal.  ?Cardiovascular:  ?   Rate and Rhythm: Normal rate and regular rhythm.  ?   Heart sounds: Normal heart sounds.  ?Pulmonary:  ?   Effort: Pulmonary effort is normal.  ?   Breath sounds: Normal breath sounds.  ?Abdominal:  ?   General: Abdomen is flat.  ?   Palpations: Abdomen is soft.  ?   Tenderness: There is no abdominal tenderness. There is no right CVA tenderness or left CVA tenderness.  ?Skin: ?   General: Skin is warm.  ?   Findings: No lesion or rash.  ?Neurological:  ?   Mental Status: She is alert and oriented to person, place, and time.  ?Psychiatric:     ?   Attention and Perception: Attention normal.     ?   Mood and Affect: Mood normal.     ?   Speech: Speech normal.     ?   Behavior: Behavior is cooperative.  ? ? ?There were no vitals taken for this visit. ?Wt Readings from Last 3 Encounters:  ?04/06/21 290 lb 11.2 oz (131.9 kg)  ?02/28/21 270 lb (122.5 kg)  ?06/22/20 270 lb (122.5 kg)  ? ? ?Health Maintenance Due  ?Topic Date Due  ? Zoster Vaccines- Shingrix (1 of 2) Never done  ? TETANUS/TDAP  12/30/2014  ? COVID-19 Vaccine (4 - Booster for Pfizer series) 09/07/2020  ? INFLUENZA VACCINE  03/13/2021  ? ? ?There are no preventive care reminders to display for this patient. ? ? ?Lab Results  ?Component Value Date  ? TSH 2.250 04/06/2021  ? ?Lab Results  ?Component Value Date  ? WBC 10.2 04/30/2018  ? HGB 14.4 04/30/2018  ? HCT 41.9 04/30/2018  ? MCV 88 04/30/2018  ? PLT 312  04/30/2018  ? ?Lab Results  ?Component Value Date  ? NA 140 04/06/2021  ? K 4.6 04/06/2021  ? CO2 25 04/06/2021  ? GLUCOSE 87 04/06/2021  ? BUN 21 04/06/2021  ? CREATININE 1.01 (H) 04/06/2021  ? BILITOT 0.4 04/06/2021  ? ALKPHOS 154 (H) 04/06/2021  ? AST 15 04/06/2021  ? ALT 12 04/06/2021  ? PROT 6.6  04/06/2021  ? ALBUMIN 4.3 04/06/2021  ? CALCIUM 10.3 04/06/2021  ? EGFR 58 (L) 04/06/2021  ? ?Lab Results  ?Component Value Date  ? CHOL 177 04/06/2021  ? ?Lab Results  ?Component Value Date  ? HDL 41 04/06/2021  ? ?Lab Results  ?Component Value Date  ? LDLCALC 106 (H) 04/06/2021  ? ?Lab Results  ?Component Value Date  ? TRIG 169 (H) 04/06/2021  ? ?Lab Results  ?Component Value Date  ? CHOLHDL 4.3 04/06/2021  ? ?Lab Results  ?Component Value Date  ? HGBA1C 5.5 04/06/2021  ? ? ?   ?Assessment & Plan:  ? ?Acute cystitis ?-UA not possible d/t Azo ?-send for culture ?-rx bactrim ds pobid x 5 days per symptoms and improvement w/ azo ? ?2. Paresthesia left arm ?-advised topical aspercreme ?-no visible wound/lesion/rash ? ?3. Allergic dermatitis ?-rx clobetasol cream prn allergic rash ? ?I, Mikey Kirschner, PA-C have reviewed all documentation for this visit. The documentation on  10/17/2021 for the exam, diagnosis, procedures, and orders are all accurate and complete. ? ?Mikey Kirschner, PA-C ?[facility] ?[id number] ?[place], [place], [address] ?Office: [phone] ?Fax: [phone]  ? ?

## 2021-10-19 ENCOUNTER — Telehealth: Payer: Self-pay

## 2021-10-19 LAB — URINE CULTURE: Organism ID, Bacteria: NO GROWTH

## 2021-10-19 NOTE — Telephone Encounter (Signed)
Copied from CRM [id number]. Topic: General - Other ?>> Oct 19, 2021  9:26 AM Tara Potter wrote: ?Reason for CRM: Pt seen urine results and wanted the nurse to call her/ she see it says no growth but wonders what the issue could be/ please advise ?

## 2021-11-20 ENCOUNTER — Other Ambulatory Visit: Payer: Self-pay | Admitting: Family Medicine

## 2021-11-20 DIAGNOSIS — I10 Essential (primary) hypertension: Secondary | ICD-10-CM

## 2021-11-22 DIAGNOSIS — H5213 Myopia, bilateral: Secondary | ICD-10-CM | POA: Diagnosis not present

## 2021-11-27 ENCOUNTER — Ambulatory Visit: Payer: Medicare PPO | Admitting: Family Medicine

## 2021-12-12 ENCOUNTER — Other Ambulatory Visit: Payer: Self-pay | Admitting: Family Medicine

## 2021-12-31 ENCOUNTER — Other Ambulatory Visit: Payer: Self-pay | Admitting: Family Medicine

## 2022-01-01 ENCOUNTER — Ambulatory Visit: Payer: Medicare PPO | Admitting: Family Medicine

## 2022-01-15 ENCOUNTER — Other Ambulatory Visit: Payer: Self-pay | Admitting: Family Medicine

## 2022-01-15 DIAGNOSIS — H903 Sensorineural hearing loss, bilateral: Secondary | ICD-10-CM | POA: Diagnosis not present

## 2022-02-20 ENCOUNTER — Other Ambulatory Visit: Payer: Self-pay | Admitting: Family Medicine

## 2022-02-20 DIAGNOSIS — I10 Essential (primary) hypertension: Secondary | ICD-10-CM

## 2022-03-05 ENCOUNTER — Ambulatory Visit: Payer: Medicare PPO | Admitting: Family Medicine

## 2022-03-06 ENCOUNTER — Encounter: Payer: Self-pay | Admitting: Physician Assistant

## 2022-03-06 ENCOUNTER — Ambulatory Visit: Payer: Medicare PPO | Admitting: Physician Assistant

## 2022-03-06 VITALS — BP 119/68 | HR 64 | Ht 69.0 in | Wt 291.0 lb

## 2022-03-06 DIAGNOSIS — M5432 Sciatica, left side: Secondary | ICD-10-CM | POA: Diagnosis not present

## 2022-03-06 DIAGNOSIS — E039 Hypothyroidism, unspecified: Secondary | ICD-10-CM | POA: Diagnosis not present

## 2022-03-06 DIAGNOSIS — E7800 Pure hypercholesterolemia, unspecified: Secondary | ICD-10-CM

## 2022-03-06 DIAGNOSIS — I10 Essential (primary) hypertension: Secondary | ICD-10-CM | POA: Diagnosis not present

## 2022-03-06 DIAGNOSIS — R7303 Prediabetes: Secondary | ICD-10-CM

## 2022-03-06 MED ORDER — GABAPENTIN 100 MG PO CAPS: 100.0000 mg | ORAL_CAPSULE | Freq: Every day | ORAL | 1 refills | Status: DC

## 2022-03-06 MED ORDER — BISOPROLOL-HYDROCHLOROTHIAZIDE 5-6.25 MG PO TABS: 1.0000 | ORAL_TABLET | Freq: Every day | ORAL | 1 refills | Status: DC

## 2022-03-06 NOTE — Assessment & Plan Note (Signed)
Chronic, well controlled Continue bisoprolol-hctz Recheck cmp

## 2022-03-06 NOTE — Assessment & Plan Note (Signed)
Historically will check a1c 

## 2022-03-06 NOTE — Assessment & Plan Note (Signed)
Managed with atorvastatin 10 mg  Recheck lipid panel nonfasting but difficult come back for fasting labs

## 2022-03-06 NOTE — Assessment & Plan Note (Signed)
Well controlled w/ levothyroxine historically Will check tsh/t4

## 2022-03-06 NOTE — Assessment & Plan Note (Signed)
Managed with gabapentin 100 mg qhs

## 2022-03-06 NOTE — Progress Notes (Signed)
I,Sha'taria Tyson,acting as a Education administrator for [employer], PA-C.,have documented all relevant documentation on the behalf of Mikey Kirschner, PA-C,as directed by  Mikey Kirschner, PA-C while in the presence of Mikey Kirschner, PA-C.   Established patient visit   Patient: Tara Potter   DOB: Jan 23, 1946   76 y.o. Female  MRN: [id number] Visit Date: 03/06/2022  Today's healthcare provider: Mikey Kirschner, PA-C   Cc. Chronic care f/u  Subjective    HPI  Hypertension, follow-up  BP Readings from Last 3 Encounters:  03/06/22 119/68  10/17/21 134/63  04/06/21 112/74   Wt Readings from Last 3 Encounters:  03/06/22 291 lb (132 kg)  10/17/21 287 lb 1.6 oz (130.2 kg)  04/06/21 290 lb 11.2 oz (131.9 kg)     She was last seen for hypertension 11 months ago.  BP at that visit was 112/74. Management since that visit includes continue Bisoprolol-HCTZ.  She reports excellent compliance with treatment. She is not having side effects. document side effects if present She is following a Regular diet. She is exercising. She does not smoke.  Use of agents associated with hypertension: none.   Outside blood pressures are checked on occasion Symptoms: No chest pain No chest pressure  No palpitations No syncope  No dyspnea No orthopnea  No paroxysmal nocturnal dyspnea No lower extremity edema   Pertinent labs Lab Results  Component Value Date   CHOL 177 04/06/2021   HDL 41 04/06/2021   LDLCALC 106 (H) 04/06/2021   TRIG 169 (H) 04/06/2021   CHOLHDL 4.3 04/06/2021   Lab Results  Component Value Date   NA 140 04/06/2021   K 4.6 04/06/2021   CREATININE 1.01 (H) 04/06/2021   EGFR 58 (L) 04/06/2021   GLUCOSE 87 04/06/2021   TSH 2.250 04/06/2021     The 10-year ASCVD risk score (Arnett DK, et al., 2019) is: 22%  --------------------------------------------------------------------------------------------------- Hypothyroid, follow-up  Lab Results  Component Value Date   TSH 2.250  04/06/2021   TSH 3.100 06/09/2020   TSH 2.250 09/11/2019    Wt Readings from Last 3 Encounters:  03/06/22 291 lb (132 kg)  10/17/21 287 lb 1.6 oz (130.2 kg)  04/06/21 290 lb 11.2 oz (131.9 kg)    She was last seen for hypothyroid 11 months ago.  Management since that visit includes continue Euthyrox. She reports excellent compliance with treatment. She is not having side effects. document side effects if present  Symptoms: No change in energy level No constipation  No diarrhea Yes heat / cold intolerance  No nervousness No palpitations  No weight changes    ----------------------------------------------------------------------------------------- Lipid/Cholesterol, Follow-up  Last lipid panel Other pertinent labs  Lab Results  Component Value Date   CHOL 177 04/06/2021   HDL 41 04/06/2021   LDLCALC 106 (H) 04/06/2021   TRIG 169 (H) 04/06/2021   CHOLHDL 4.3 04/06/2021   Lab Results  Component Value Date   ALT 12 04/06/2021   AST 15 04/06/2021   PLT 312 04/30/2018   TSH 2.250 04/06/2021     She was last seen for this 11 months ago.  Management since that visit includes continue Atorvastatin.  She reports excellent compliance with treatment. She is not having side effects. document side effects if present:  Symptoms: No chest pain No chest pressure/discomfort  No dyspnea No lower extremity edema  No numbness or tingling of extremity No orthopnea  No palpitations No paroxysmal nocturnal dyspnea  No speech difficulty No syncope   Current  diet: in general, an "unhealthy" diet Current exercise: walking  The 10-year ASCVD risk score (Arnett DK, et al., 2019) is: 22%  ---------------------------------------------------------------------------------------------------   Medications: Outpatient Medications Prior to Visit  Medication Sig   acetaminophen (TYLENOL) 650 MG CR tablet Take 650 mg by mouth in the morning and at bedtime.   aspirin EC 81 MG tablet Take 81  mg by mouth every other day.   atorvastatin (LIPITOR) 10 MG tablet TAKE 1 TABLET BY MOUTH EVERY DAY   clobetasol cream (TEMOVATE) 1.61 % Apply 1 application. topically 2 (two) times daily.   diclofenac (VOLTAREN) 75 MG EC tablet TAKE 1 TABLET BY MOUTH TWICE A DAY   diclofenac Sodium (VOLTAREN) 1 % GEL Apply topically daily.   estradiol (ESTRACE VAGINAL) 0.1 MG/GM vaginal cream Place 1 Applicatorful vaginally 3 (three) times a week.   famotidine (PEPCID) 20 MG tablet Take 1 tablet by mouth twice daily (Patient taking differently: as needed.)   fluticasone (FLONASE) 50 MCG/ACT nasal spray Use 2 spray(s) in each nostril once daily   levothyroxine (SYNTHROID) 75 MCG tablet TAKE 1 TABLET BY MOUTH EVERY DAY   loratadine (CLARITIN) 10 MG tablet Take 10 mg by mouth daily as needed for allergies.   Multiple Vitamins-Minerals (MULTIVITAL PO) Take by mouth daily. When remembers   [DISCONTINUED] bisoprolol-hydrochlorothiazide (ZIAC) 5-6.25 MG tablet TAKE 1 TABLET BY MOUTH EVERY DAY   [DISCONTINUED] gabapentin (NEURONTIN) 100 MG capsule TAKE 1 CAPSULE BY MOUTH THREE TIMES A DAY   No facility-administered medications prior to visit.    Review of Systems  Constitutional:  Negative for fatigue and fever.  Respiratory:  Negative for cough and shortness of breath.   Cardiovascular:  Negative for chest pain and leg swelling.  Gastrointestinal:  Negative for abdominal pain.  Neurological:  Negative for dizziness and headaches.       Objective    Blood pressure 119/68, pulse 64, height '5\' 9"'  (1.753 m), weight 291 lb (132 kg), SpO2 97 %.   Physical Exam Constitutional:      General: She is awake.     Appearance: She is well-developed.  HENT:     Head: Normocephalic.  Eyes:     Conjunctiva/sclera: Conjunctivae normal.  Cardiovascular:     Rate and Rhythm: Normal rate and regular rhythm.     Heart sounds: Normal heart sounds.  Pulmonary:     Effort: Pulmonary effort is normal.     Breath sounds:  Normal breath sounds.  Skin:    General: Skin is warm.  Neurological:     Mental Status: She is alert and oriented to person, place, and time.  Psychiatric:        Attention and Perception: Attention normal.        Mood and Affect: Mood normal.        Speech: Speech normal.        Behavior: Behavior is cooperative.     No results found for any visits on 03/06/22.  Assessment & Plan     Problem List Items Addressed This Visit       Cardiovascular and Mediastinum   Essential (primary) hypertension - Primary    Chronic, well controlled Continue bisoprolol-hctz Recheck cmp      Relevant Medications   bisoprolol-hydrochlorothiazide (ZIAC) 5-6.25 MG tablet   Other Relevant Orders   Comprehensive metabolic panel     Endocrine   Adult hypothyroidism    Well controlled w/ levothyroxine historically Will check tsh/t4      Relevant Orders  TSH + free T4     Nervous and Auditory   Sciatica of left side    Managed with gabapentin 100 mg qhs      Relevant Medications   gabapentin (NEURONTIN) 100 MG capsule     Other   HLD (hyperlipidemia)    Managed with atorvastatin 10 mg  Recheck lipid panel nonfasting but difficult come back for fasting labs      Relevant Medications   bisoprolol-hydrochlorothiazide (ZIAC) 5-6.25 MG tablet   Other Relevant Orders   Lipid panel   Comprehensive metabolic panel   Prediabetes    Historically will check a1c      Relevant Orders   HgB A1c   CBC with Differential/Platelet    Return in about 6 months (around 09/06/2022) for chronic conditions.      I, Mikey Kirschner, PA-C have reviewed all documentation for this visit. The documentation on  03/06/2022 for the exam, diagnosis, procedures, and orders are all accurate and complete Mikey Kirschner, PA-C [facility] [id number] [place], [place], [address] Office: [phone] Fax: [place]

## 2022-03-07 LAB — COMPREHENSIVE METABOLIC PANEL
ALT: 13 IU/L (ref 0–32)
AST: 12 IU/L (ref 0–40)
Albumin/Globulin Ratio: 1.8 (ref 1.2–2.2)
Albumin: 4.2 g/dL (ref 3.8–4.8)
Alkaline Phosphatase: 146 IU/L — ABNORMAL HIGH (ref 44–121)
BUN/Creatinine Ratio: 26 (ref 12–28)
BUN: 30 mg/dL — ABNORMAL HIGH (ref 8–27)
Bilirubin Total: 0.3 mg/dL (ref 0.0–1.2)
CO2: 25 mmol/L (ref 20–29)
Calcium: 10 mg/dL (ref 8.7–10.3)
Chloride: 100 mmol/L (ref 96–106)
Creatinine, Ser: 1.15 mg/dL — ABNORMAL HIGH (ref 0.57–1.00)
Globulin, Total: 2.3 g/dL (ref 1.5–4.5)
Glucose: 88 mg/dL (ref 70–99)
Potassium: 5.1 mmol/L (ref 3.5–5.2)
Sodium: 138 mmol/L (ref 134–144)
Total Protein: 6.5 g/dL (ref 6.0–8.5)
eGFR: 49 mL/min/{1.73_m2} — ABNORMAL LOW (ref >59)

## 2022-03-07 LAB — CBC WITH DIFFERENTIAL/PLATELET
Basophils Absolute: 0.1 10*3/uL (ref 0.0–0.2)
Basos: 1 %
EOS (ABSOLUTE): 0.2 10*3/uL (ref 0.0–0.4)
Eos: 2 %
Hematocrit: 38.1 % (ref 34.0–46.6)
Hemoglobin: 13 g/dL (ref 11.1–15.9)
Immature Grans (Abs): 0 10*3/uL (ref 0.0–0.1)
Immature Granulocytes: 0 %
Lymphocytes Absolute: 2.8 10*3/uL (ref 0.7–3.1)
Lymphs: 32 %
MCH: 30.2 pg (ref 26.6–33.0)
MCHC: 34.1 g/dL (ref 31.5–35.7)
MCV: 88 fL (ref 79–97)
Monocytes Absolute: 0.8 10*3/uL (ref 0.1–0.9)
Monocytes: 9 %
Neutrophils Absolute: 4.9 10*3/uL (ref 1.4–7.0)
Neutrophils: 56 %
Platelets: 259 10*3/uL (ref 150–450)
RBC: 4.31 x10E6/uL (ref 3.77–5.28)
RDW: 12 % (ref 11.7–15.4)
WBC: 8.8 10*3/uL (ref 3.4–10.8)

## 2022-03-07 LAB — LIPID PANEL
Chol/HDL Ratio: 4.5 ratio — ABNORMAL HIGH (ref 0.0–4.4)
Cholesterol, Total: 207 mg/dL — ABNORMAL HIGH (ref 100–199)
HDL: 46 mg/dL (ref >39)
LDL Chol Calc (NIH): 128 mg/dL — ABNORMAL HIGH (ref 0–99)
Triglycerides: 186 mg/dL — ABNORMAL HIGH (ref 0–149)
VLDL Cholesterol Cal: 33 mg/dL (ref 5–40)

## 2022-03-07 LAB — TSH+FREE T4
Free T4: 1.49 ng/dL (ref 0.82–1.77)
TSH: 4.25 u[IU]/mL (ref 0.450–4.500)

## 2022-03-07 LAB — HEMOGLOBIN A1C
Est. average glucose Bld gHb Est-mCnc: 111 mg/dL
Hgb A1c MFr Bld: 5.5 % (ref 4.8–5.6)

## 2022-03-20 ENCOUNTER — Other Ambulatory Visit: Payer: Self-pay | Admitting: Family Medicine

## 2022-03-20 DIAGNOSIS — I10 Essential (primary) hypertension: Secondary | ICD-10-CM

## 2022-04-21 ENCOUNTER — Other Ambulatory Visit: Payer: Self-pay | Admitting: Family Medicine

## 2022-04-21 DIAGNOSIS — I10 Essential (primary) hypertension: Secondary | ICD-10-CM

## 2022-04-26 ENCOUNTER — Ambulatory Visit (INDEPENDENT_AMBULATORY_CARE_PROVIDER_SITE_OTHER): Payer: Medicare PPO

## 2022-04-26 VITALS — Ht 69.0 in | Wt 291.0 lb

## 2022-04-26 DIAGNOSIS — Z0000 Encounter for general adult medical examination without abnormal findings: Secondary | ICD-10-CM

## 2022-04-26 NOTE — Progress Notes (Signed)
Virtual Visit via Telephone Note  I connected with  Tara Potter on 04/26/22 at  1:00 PM EDT by telephone and verified that I am speaking with the correct person using two identifiers.  Location: Patient: home Provider: [facility] Persons participating in the virtual visit: patient/Nurse Health Advisor   I discussed the limitations, risks, security and privacy concerns of performing an evaluation and management service by telephone and the availability of in person appointments. The patient expressed understanding and agreed to proceed.  Interactive audio and video telecommunications were attempted between this nurse and patient, however failed, due to patient having technical difficulties OR patient did not have access to video capability.  We continued and completed visit with audio only.  Some vital signs may be absent or patient reported.   Hal Hope, LPN  Subjective:   Tara Potter is a 76 y.o. female who presents for Medicare Annual (Subsequent) preventive examination.  Review of Systems     Cardiac Risk Factors include: advanced age (>83men, >85 women);dyslipidemia;hypertension     Objective:    There were no vitals filed for this visit. There is no height or weight on file to calculate BMI.     04/26/2022    1:06 PM 06/22/2020   11:30 AM 03/10/2020   10:32 AM 02/10/2020   11:08 AM 02/02/2020    1:19 PM 09/02/2019   11:14 AM 04/21/2018    1:58 PM  Advanced Directives  Does Patient Have a Medical Advance Directive? No No No No No No No  Would patient like information on creating a medical advance directive? No - Patient declined No - Patient declined   No - Patient declined No - Patient declined No - Patient declined    Current Medications (verified) Outpatient Encounter Medications as of 04/26/2022  Medication Sig   acetaminophen (TYLENOL) 650 MG CR tablet Take 650 mg by mouth in the morning and at bedtime.   atorvastatin (LIPITOR) 10 MG tablet TAKE 1 TABLET BY MOUTH  EVERY DAY   bisoprolol-hydrochlorothiazide (ZIAC) 5-6.25 MG tablet Take 1 tablet by mouth daily.   clobetasol cream (TEMOVATE) 0.05 % Apply 1 application. topically 2 (two) times daily.   [facility] ACETAMINOPHEN 325 MG CAPS    diclofenac (VOLTAREN) 75 MG EC tablet TAKE 1 TABLET BY MOUTH TWICE A DAY   diclofenac Sodium (VOLTAREN) 1 % GEL Apply topically daily.   estradiol (ESTRACE VAGINAL) 0.1 MG/GM vaginal cream Place 1 Applicatorful vaginally 3 (three) times a week.   famotidine (PEPCID) 20 MG tablet Take 1 tablet by mouth twice daily (Patient taking differently: as needed.)   fluticasone (FLONASE) 50 MCG/ACT nasal spray Use 2 spray(s) in each nostril once daily   gabapentin (NEURONTIN) 100 MG capsule Take 1 capsule (100 mg total) by mouth at bedtime.   levothyroxine (SYNTHROID) 75 MCG tablet TAKE 1 TABLET BY MOUTH EVERY DAY   loratadine (CLARITIN) 10 MG tablet Take 10 mg by mouth daily as needed for allergies.   Multiple Vitamins-Minerals (MULTIVITAL PO) Take by mouth daily. When remembers   aspirin EC 81 MG tablet Take 81 mg by mouth every other day. (Patient not taking: Reported on 04/26/2022)   No facility-administered encounter medications on file as of 04/26/2022.    Allergies (verified) Amoxicillin, Entex t [pseudoephedrine-guaifenesin], and Erythromycin   History: Past Medical History:  Diagnosis Date   Arthritis    Hyperlipidemia    Hypertension    Past Surgical History:  Procedure Laterality Date   DILATION AND CURETTAGE  OF UTERUS     TUBAL LIGATION     Family History  Problem Relation Age of Onset   Hypertension Mother    Macular degeneration Mother    Atrial fibrillation Mother    Kidney disease Mother    Heart disease Father    Arthritis Sister    Breast cancer Neg Hx    Social History   Socioeconomic History   Marital status: Widowed    Spouse name: Not on file   Number of children: 2   Years of education: Not on file   Highest education level: Bachelor's  degree (e.g., BA, AB, BS)  Occupational History   Occupation: retired  Tobacco Use   Smoking status: Never   Smokeless tobacco: Never  Vaping Use   Vaping Use: Never used  Substance and Sexual Activity   Alcohol use: Not Currently   Drug use: No   Sexual activity: Not on file  Other Topics Concern   Not on file  Social History Narrative   Not on file   Social Determinants of Health   Financial Resource Strain: Low Risk  (04/26/2022)   Overall Financial Resource Strain (CARDIA)    Difficulty of Paying Living Expenses: Not hard at all  Food Insecurity: No Food Insecurity (04/26/2022)   Hunger Vital Sign    Worried About Running Out of Food in the Last Year: Never true    Ran Out of Food in the Last Year: Never true  Transportation Needs: No Transportation Needs (04/26/2022)   PRAPARE - Administrator, Civil Service (Medical): No    Lack of Transportation (Non-Medical): No  Physical Activity: Insufficiently Active (04/26/2022)   Exercise Vital Sign    Days of Exercise per Week: 3 days    Minutes of Exercise per Session: 30 min  Stress: No Stress Concern Present (04/26/2022)   [employer] of Occupational Health - Occupational Stress Questionnaire    Feeling of Stress : Only a little  Social Connections: Socially Isolated (04/26/2022)   Social Connection and Isolation Panel [NHANES]    Frequency of Communication with Friends and Family: More than three times a week    Frequency of Social Gatherings with Friends and Family: Once a week    Attends Religious Services: Never    Database administrator or Organizations: No    Attends Banker Meetings: Never    Marital Status: Widowed    Tobacco Counseling Counseling given: Not Answered   Clinical Intake:  Pre-visit preparation completed: Yes  Pain : No/denies pain     Nutritional Risks: None Diabetes: No  How often do you need to have someone help you when you read instructions, pamphlets, or  other written materials from your doctor or pharmacy?: 1 - Never  Diabetic?no  Interpreter Needed?: No  Information entered by :: Kennedy Bucker, LPN   Activities of Daily Living    04/26/2022    1:07 PM 03/06/2022    2:00 PM  In your present state of health, do you have any difficulty performing the following activities:  Hearing? 1 1  Vision? 1 1  Difficulty concentrating or making decisions? 0 0  Walking or climbing stairs? 1 1  Dressing or bathing? 0 0  Doing errands, shopping? 0 0  Preparing Food and eating ? N   Using the Toilet? N   In the past six months, have you accidently leaked urine? N   Do you have problems with loss of bowel control? N  Managing your Medications? N   Managing your Finances? N   Housekeeping or managing your Housekeeping? N     Patient Care Team: Erasmo DownerBacigalupo, Angela M, MD as PCP - General (Family Medicine) Dingeldein, Viviann SpareSteven, MD as Consulting Physician (Ophthalmology)  Indicate any recent Medical Services you may have received from other than [facility] providers in the past year (date may be approximate).     Assessment:   This is a routine wellness examination for Tara ClosJudith.  Hearing/Vision screen Hearing Screening - Comments:: Wears aids Vision Screening - Comments:: Wears glasses- [facility]  Dietary issues and exercise activities discussed: Current Exercise Habits: Home exercise routine, Type of exercise: walking, Time (Minutes): 30, Frequency (Times/Week): 3, Weekly Exercise (Minutes/Week): 90, Intensity: Mild   Goals Addressed             This Visit's Progress    DIET - EAT MORE FRUITS AND VEGETABLES         Depression Screen    04/26/2022    1:03 PM 03/06/2022    2:00 PM 04/06/2021    2:20 PM 06/22/2020   11:30 AM 03/10/2020   10:37 AM 02/10/2020   11:08 AM 02/02/2020    1:15 PM  PHQ 2/9 Scores  PHQ - 2 Score 2 2 2  0 0 0 0  PHQ- 9 Score 4 4 4       Exception Documentation      Patient refusal     Fall Risk    04/26/2022     1:06 PM 03/06/2022    2:00 PM 04/06/2021    2:21 PM 06/22/2020   11:30 AM 03/10/2020   10:36 AM  Fall Risk   Falls in the past year? 0 0 0 0 0  Number falls in past yr: 0 0 0    Injury with Fall? 0 0 0    Risk for fall due to : No Fall Risks No Fall Risks No Fall Risks    Follow up Falls prevention discussed  Falls evaluation completed      FALL RISK PREVENTION PERTAINING TO THE HOME:  Any stairs in or around the home? Yes  If so, are there any without handrails? No  Home free of loose throw rugs in walkways, pet beds, electrical cords, etc? Yes  Adequate lighting in your home to reduce risk of falls? Yes   ASSISTIVE DEVICES UTILIZED TO PREVENT FALLS:  Life alert? No  Use of a cane, walker or w/c? Yes  Grab bars in the bathroom? Yes  Shower chair or bench in shower? Yes  Elevated toilet seat or a handicapped toilet? Yes   Cognitive Function:        04/26/2022    1:08 PM 04/06/2021    2:21 PM 09/11/2019   11:01 AM 04/21/2018    2:04 PM  6CIT Screen  What Year? 0 points 0 points 0 points 0 points  What month? 0 points 0 points 0 points 0 points  What time? 0 points 0 points 0 points 0 points  Count back from 20 0 points 0 points 0 points 0 points  Months in reverse 0 points 0 points 0 points 0 points  Repeat phrase 2 points 4 points 2 points 0 points  Total Score 2 points 4 points 2 points 0 points    Immunizations Immunization History  Administered Date(s) Administered   Fluad Quad(high Dose 65+) 04/06/2019, 06/09/2020   Influenza, High Dose Seasonal PF 06/22/2015, 06/07/2016, 04/11/2017, 04/28/2018   PFIZER(Purple Top)SARS-COV-2 Vaccination 09/18/2019, 10/07/2019,  07/13/2020   Pneumococcal Conjugate-13 04/11/2017   Pneumococcal Polysaccharide-23 05/18/2013   Td 12/29/2004    TDAP status: Due, Education has been provided regarding the importance of this vaccine. Advised may receive this vaccine at local pharmacy or Health Dept. Aware to provide a copy of the  vaccination record if obtained from local pharmacy or Health Dept. Verbalized acceptance and understanding.  Flu Vaccine status: Due, Education has been provided regarding the importance of this vaccine. Advised may receive this vaccine at local pharmacy or Health Dept. Aware to provide a copy of the vaccination record if obtained from local pharmacy or Health Dept. Verbalized acceptance and understanding.  Pneumococcal vaccine status: Up to date  Covid-19 vaccine status: Completed vaccines  Qualifies for Shingles Vaccine? Yes   Zostavax completed No   Shingrix Completed?: No.    Education has been provided regarding the importance of this vaccine. Patient has been advised to call insurance company to determine out of pocket expense if they have not yet received this vaccine. Advised may also receive vaccine at local pharmacy or Health Dept. Verbalized acceptance and understanding.  Screening Tests Health Maintenance  Topic Date Due   Zoster Vaccines- Shingrix (1 of 2) Never done   TETANUS/TDAP  12/30/2014   COVID-19 Vaccine (4 - Pfizer series) 09/07/2020   INFLUENZA VACCINE  03/13/2022   DEXA SCAN  08/13/2026 (Originally 01/11/2010)   Pneumonia Vaccine 90+ Years old  Completed   Hepatitis C Screening  Completed   HPV VACCINES  Aged Out    Health Maintenance  Health Maintenance Due  Topic Date Due   Zoster Vaccines- Shingrix (1 of 2) Never done   TETANUS/TDAP  12/30/2014   COVID-19 Vaccine (4 - Pfizer series) 09/07/2020   INFLUENZA VACCINE  03/13/2022    Colorectal cancer screening: No longer required.   Mammogram status: No longer required due to age.  [facility]- declined referral, tech told her she was too big for machine  Lung Cancer Screening: (Low Dose CT Chest recommended if Age 41-80 years, 30 pack-year currently smoking OR have quit w/in 15years.) does not qualify.    Additional Screening:  Hepatitis C Screening: does qualify; Completed 04/11/17  Vision Screening:  Recommended annual ophthalmology exams for early detection of glaucoma and other disorders of the eye. Is the patient up to date with their annual eye exam?  Yes  Who is the provider or what is the name of the office in which the patient attends annual eye exams? [facility] If pt is not established with a provider, would they like to be referred to a provider to establish care? No .   Dental Screening: Recommended annual dental exams for proper oral hygiene  Community Resource Referral / Chronic Care Management: CRR required this visit?  No   CCM required this visit?  No      Plan:     I have personally reviewed and noted the following in the patient's chart:   Medical and social history Use of alcohol, tobacco or illicit drugs  Current medications and supplements including opioid prescriptions. Patient is not currently taking opioid prescriptions. Functional ability and status Nutritional status Physical activity Advanced directives List of other physicians Hospitalizations, surgeries, and ER visits in previous 12 months Vitals Screenings to include cognitive, depression, and falls Referrals and appointments  In addition, I have reviewed and discussed with patient certain preventive protocols, quality metrics, and best practice recommendations. A written personalized care plan for preventive services as well as general preventive  health recommendations were provided to patient.     Hal Hope, LPN   11/19/1446   Nurse Notes: none

## 2022-04-26 NOTE — Patient Instructions (Signed)
Tara Potter , Thank you for taking time to come for your Medicare Wellness Visit. I appreciate your ongoing commitment to your health goals. Please review the following plan we discussed and let me know if I can assist you in the future.   Screening recommendations/referrals: Colonoscopy: aged out Mammogram: aged out Bone Density: declined Recommended yearly ophthalmology/optometry visit for glaucoma screening and checkup Recommended yearly dental visit for hygiene and checkup  Vaccinations: Influenza vaccine: n/d Pneumococcal vaccine: 04/11/17 Tdap vaccine: 12/29/04, due if have injury Shingles vaccine: n/d   Covid-19:09/18/19, 10/07/19, 07/13/20  Advanced directives: no  Conditions/risks identified: none  Next appointment: Follow up in one year for your annual wellness visit 04/29/23 @ 1:15 pm by phone   Preventive Care 65 Years and Older, Female Preventive care refers to lifestyle choices and visits with your health care provider that can promote health and wellness. What does preventive care include? A yearly physical exam. This is also called an annual well check. Dental exams once or twice a year. Routine eye exams. Ask your health care provider how often you should have your eyes checked. Personal lifestyle choices, including: Daily care of your teeth and gums. Regular physical activity. Eating a healthy diet. Avoiding tobacco and drug use. Limiting alcohol use. Practicing safe sex. Taking low-dose aspirin every day. Taking vitamin and mineral supplements as recommended by your health care provider. What happens during an annual well check? The services and screenings done by your health care provider during your annual well check will depend on your age, overall health, lifestyle risk factors, and family history of disease. Counseling  Your health care provider may ask you questions about your: Alcohol use. Tobacco use. Drug use. Emotional well-being. Home and relationship  well-being. Sexual activity. Eating habits. History of falls. Memory and ability to understand (cognition). Work and work Astronomer. Reproductive health. Screening  You may have the following tests or measurements: Height, weight, and BMI. Blood pressure. Lipid and cholesterol levels. These may be checked every 5 years, or more frequently if you are over 37 years old. Skin check. Lung cancer screening. You may have this screening every year starting at age 47 if you have a 30-pack-year history of smoking and currently smoke or have quit within the past 15 years. Fecal occult blood test (FOBT) of the stool. You may have this test every year starting at age 3. Flexible sigmoidoscopy or colonoscopy. You may have a sigmoidoscopy every 5 years or a colonoscopy every 10 years starting at age 76. Hepatitis C blood test. Hepatitis B blood test. Sexually transmitted disease (STD) testing. Diabetes screening. This is done by checking your blood sugar (glucose) after you have not eaten for a while (fasting). You may have this done every 1-3 years. Bone density scan. This is done to screen for osteoporosis. You may have this done starting at age 32. Mammogram. This may be done every 1-2 years. Talk to your health care provider about how often you should have regular mammograms. Talk with your health care provider about your test results, treatment options, and if necessary, the need for more tests. Vaccines  Your health care provider may recommend certain vaccines, such as: Influenza vaccine. This is recommended every year. Tetanus, diphtheria, and acellular pertussis (Tdap, Td) vaccine. You may need a Td booster every 10 years. Zoster vaccine. You may need this after age 19. Pneumococcal 13-valent conjugate (PCV13) vaccine. One dose is recommended after age 58. Pneumococcal polysaccharide (PPSV23) vaccine. One dose is recommended after age  77. Talk to your health care provider about which  screenings and vaccines you need and how often you need them. This information is not intended to replace advice given to you by your health care provider. Make sure you discuss any questions you have with your health care provider. Document Released: 08/26/2015 Document Revised: 04/18/2016 Document Reviewed: 05/31/2015 Elsevier Interactive Patient Education  2017 [place] Prevention in the Home Falls can cause injuries. They can happen to people of all ages. There are many things you can do to make your home safe and to help prevent falls. What can I do on the outside of my home? Regularly fix the edges of walkways and driveways and fix any cracks. Remove anything that might make you trip as you walk through a door, such as a raised step or threshold. Trim any bushes or trees on the path to your home. Use bright outdoor lighting. Clear any walking paths of anything that might make someone trip, such as rocks or tools. Regularly check to see if handrails are loose or broken. Make sure that both sides of any steps have handrails. Any raised decks and porches should have guardrails on the edges. Have any leaves, snow, or ice cleared regularly. Use sand or salt on walking paths during winter. Clean up any spills in your garage right away. This includes oil or grease spills. What can I do in the bathroom? Use night lights. Install grab bars by the toilet and in the tub and shower. Do not use towel bars as grab bars. Use non-skid mats or decals in the tub or shower. If you need to sit down in the shower, use a plastic, non-slip stool. Keep the floor dry. Clean up any water that spills on the floor as soon as it happens. Remove soap buildup in the tub or shower regularly. Attach bath mats securely with double-sided non-slip rug tape. Do not have throw rugs and other things on the floor that can make you trip. What can I do in the bedroom? Use night lights. Make sure that you have a  light by your bed that is easy to reach. Do not use any sheets or blankets that are too big for your bed. They should not hang down onto the floor. Have a firm chair that has side arms. You can use this for support while you get dressed. Do not have throw rugs and other things on the floor that can make you trip. What can I do in the kitchen? Clean up any spills right away. Avoid walking on wet floors. Keep items that you use a lot in easy-to-reach places. If you need to reach something above you, use a strong step stool that has a grab bar. Keep electrical cords out of the way. Do not use floor polish or wax that makes floors slippery. If you must use wax, use non-skid floor wax. Do not have throw rugs and other things on the floor that can make you trip. What can I do with my stairs? Do not leave any items on the stairs. Make sure that there are handrails on both sides of the stairs and use them. Fix handrails that are broken or loose. Make sure that handrails are as long as the stairways. Check any carpeting to make sure that it is firmly attached to the stairs. Fix any carpet that is loose or worn. Avoid having throw rugs at the top or bottom of the stairs. If you do have  throw rugs, attach them to the floor with carpet tape. Make sure that you have a light switch at the top of the stairs and the bottom of the stairs. If you do not have them, ask someone to add them for you. What else can I do to help prevent falls? Wear shoes that: Do not have high heels. Have rubber bottoms. Are comfortable and fit you well. Are closed at the toe. Do not wear sandals. If you use a stepladder: Make sure that it is fully opened. Do not climb a closed stepladder. Make sure that both sides of the stepladder are locked into place. Ask someone to hold it for you, if possible. Clearly mark and make sure that you can see: Any grab bars or handrails. First and last steps. Where the edge of each step  is. Use tools that help you move around (mobility aids) if they are needed. These include: Canes. Walkers. Scooters. Crutches. Turn on the lights when you go into a dark area. Replace any light bulbs as soon as they burn out. Set up your furniture so you have a clear path. Avoid moving your furniture around. If any of your floors are uneven, fix them. If there are any pets around you, be aware of where they are. Review your medicines with your doctor. Some medicines can make you feel dizzy. This can increase your chance of falling. Ask your doctor what other things that you can do to help prevent falls. This information is not intended to replace advice given to you by your health care provider. Make sure you discuss any questions you have with your health care provider. Document Released: 05/26/2009 Document Revised: 01/05/2016 Document Reviewed: 09/03/2014 Elsevier Interactive Patient Education  2017 [employer].

## 2022-05-02 IMAGING — CR DG HIP (WITH OR WITHOUT PELVIS) 2-3V*R*
1 series · 3 of 3 positions shown · non-contrast
Comparison: None.

CLINICAL DATA: 74-year-old female with lower back pain and
bilateral hip pain.

EXAM:
DG HIP (WITH OR WITHOUT PELVIS) 2-3V LEFT; DG HIP (WITH OR WITHOUT
PELVIS) 2-3V RIGHT

[Series 1: dg hip unilat w or w/o pelvis 2-3 views  · non-contrast · 0.14mm/px · 3 of 3 slices shown]
[im 1/3]
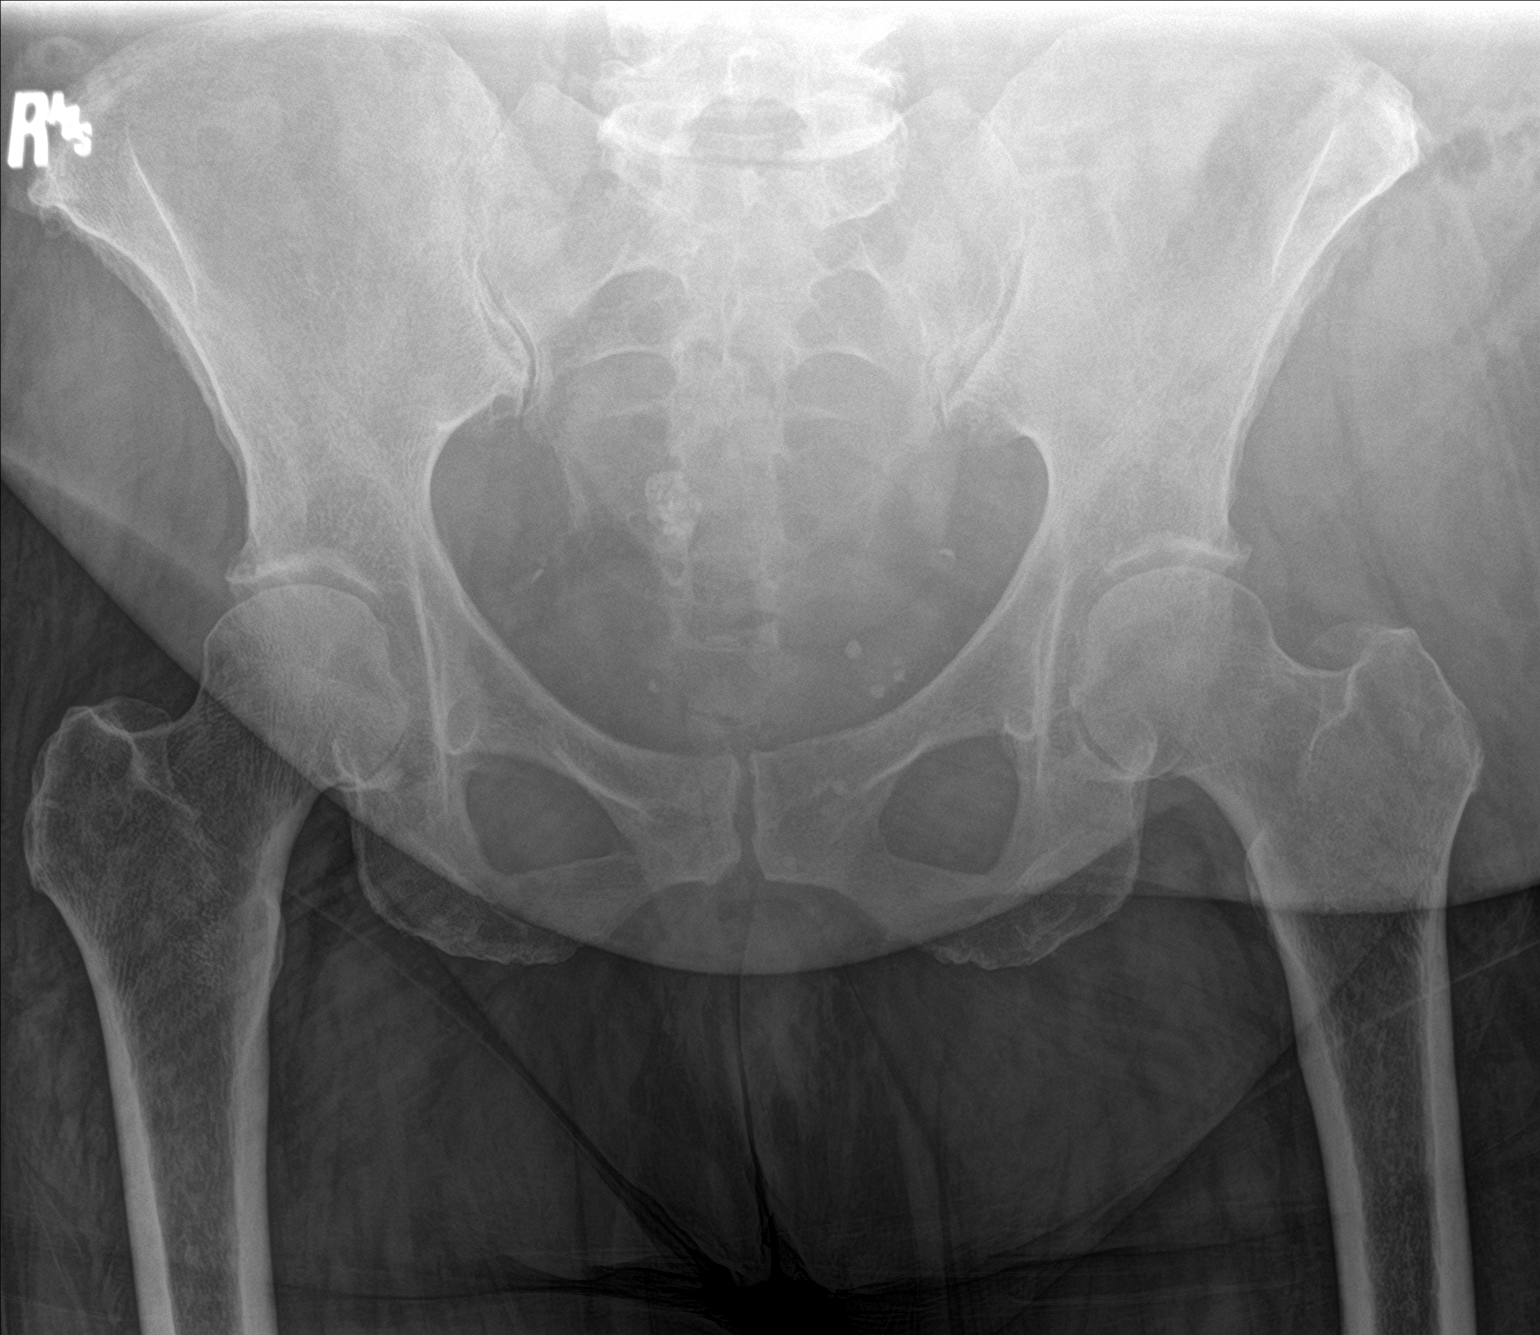
[im 2/3]
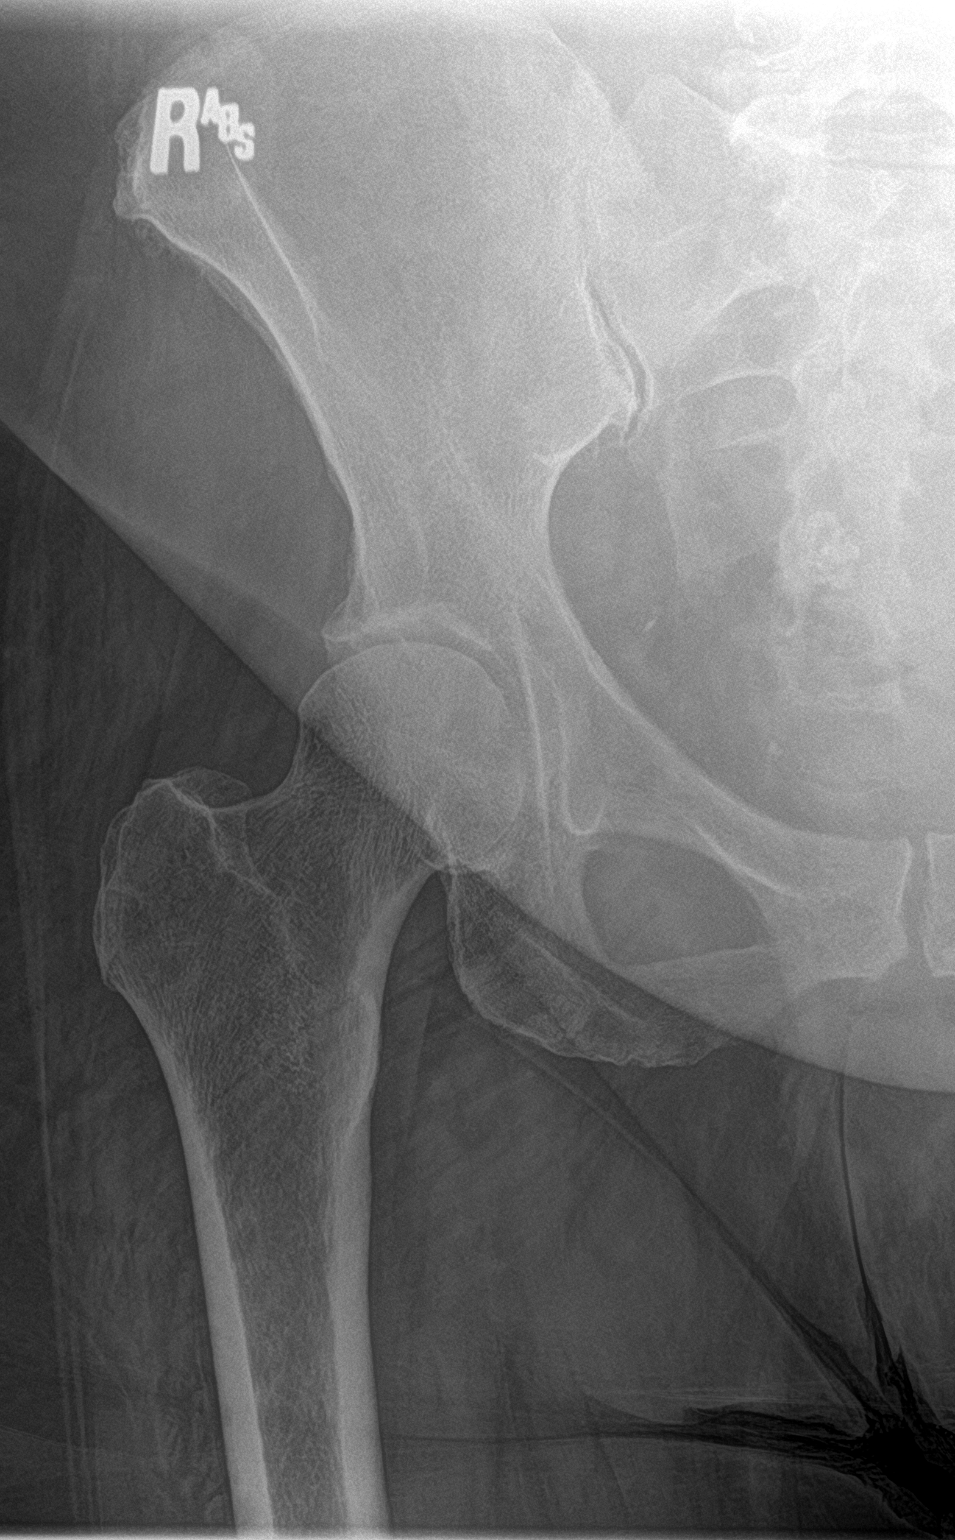
[im 3/3]
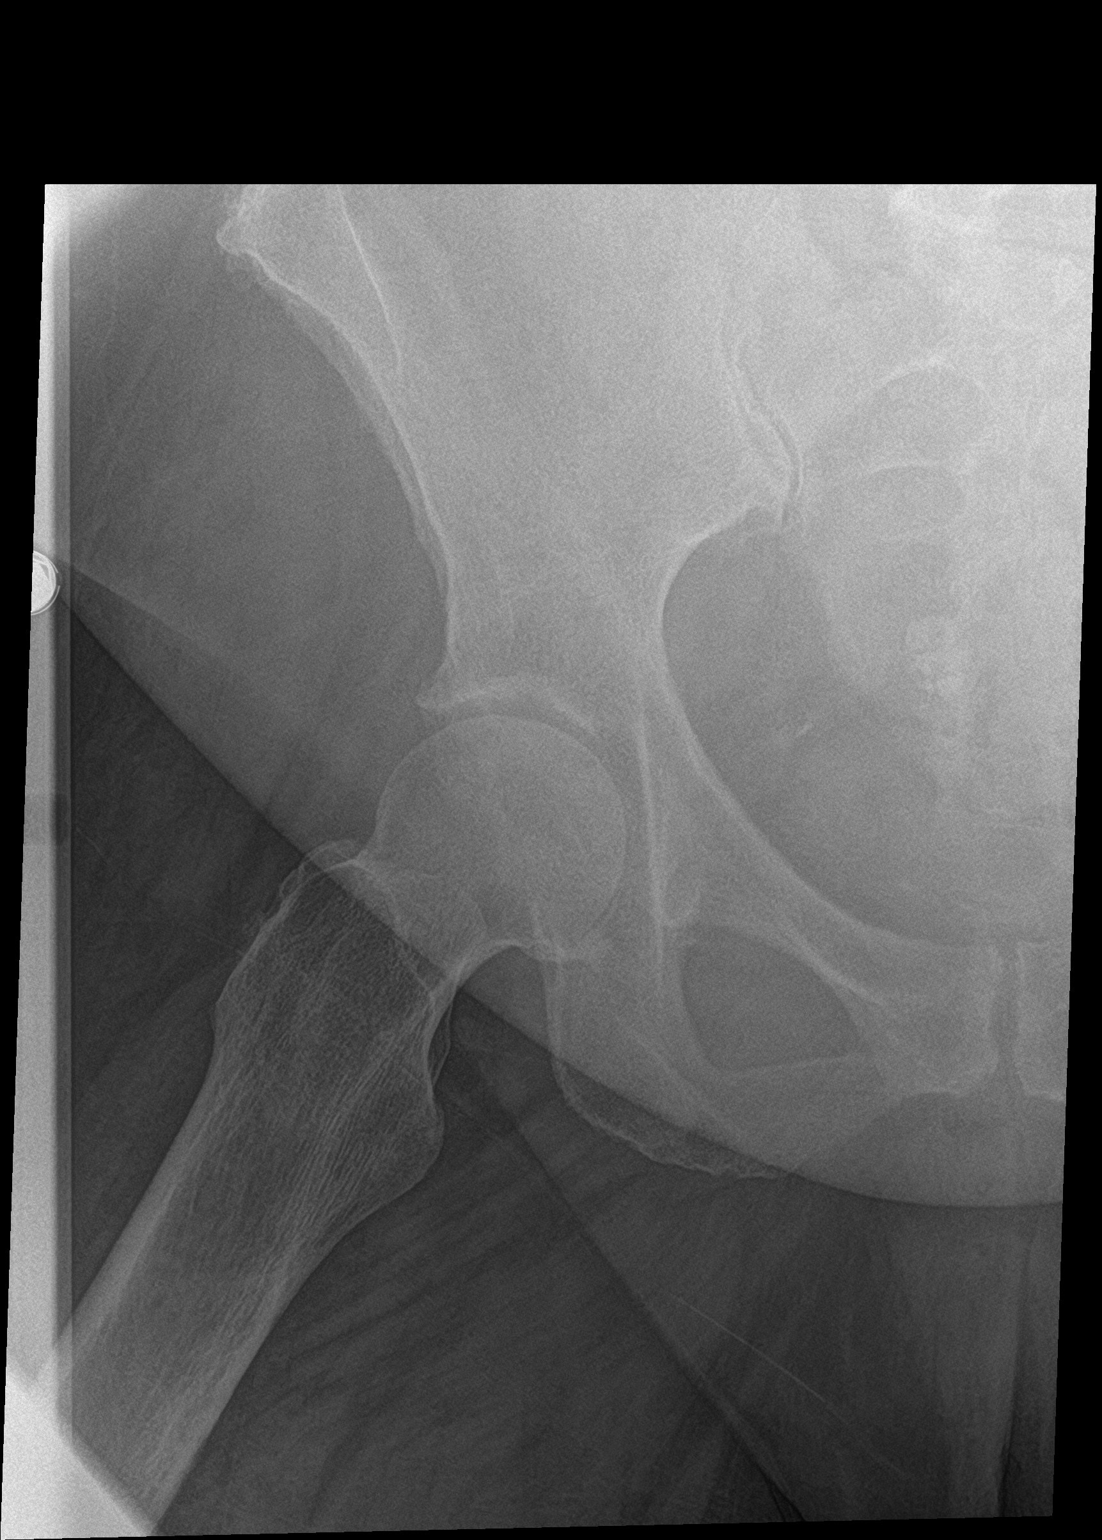

[3 of 3 positions shown; findings below may reference images not displayed]

FINDINGS: There is no acute fracture or dislocation. Mild osteopenia and mild
bilateral hip osteoarthritic changes. The soft tissues are
unremarkable. Probable small calcified fibroid over the pelvis.
IMPRESSION: 1. No acute fracture or dislocation.
2. Mild bilateral hip osteoarthritic changes.

## 2022-05-16 ENCOUNTER — Other Ambulatory Visit: Payer: Self-pay | Admitting: Family Medicine

## 2022-05-16 NOTE — Telephone Encounter (Signed)
Future visit in 3 months. Requested Prescriptions  Pending Prescriptions Disp Refills  . diclofenac (VOLTAREN) 75 MG EC tablet [Pharmacy Med Name: DICLOFENAC SOD EC 75 MG TAB] 118 tablet 1    Sig: TAKE 1 TABLET BY MOUTH TWICE A DAY     Analgesics:  NSAIDS Failed - 05/16/2022  7:55 AM      Failed - Manual Review: Labs are only required if the patient has taken medication for more than 8 weeks.      Failed - Cr in normal range and within 360 days    Creatinine, Ser  Date Value Ref Range Status  03/06/2022 1.15 (H) 0.57 - 1.00 mg/dL Final         Passed - HGB in normal range and within 360 days    Hemoglobin  Date Value Ref Range Status  03/06/2022 13.0 11.1 - 15.9 g/dL Final         Passed - PLT in normal range and within 360 days    Platelets  Date Value Ref Range Status  03/06/2022 259 150 - 450 x10E3/uL Final         Passed - HCT in normal range and within 360 days    Hematocrit  Date Value Ref Range Status  03/06/2022 38.1 34.0 - 46.6 % Final         Passed - eGFR is 30 or above and within 360 days    GFR calc Af Amer  Date Value Ref Range Status  06/09/2020 50 (L) >59 mL/min/1.73 Final    Comment:    **In accordance with recommendations from the NKF-ASN Task force,**   [facility] is in the process of updating its eGFR calculation to the   2021 CKD-EPI creatinine equation that estimates kidney function   without a race variable.    GFR calc non Af Amer  Date Value Ref Range Status  06/09/2020 43 (L) >59 mL/min/1.73 Final   eGFR  Date Value Ref Range Status  03/06/2022 49 (L) >59 mL/min/1.73 Final         Passed - Patient is not pregnant      Passed - Valid encounter within last 12 months    Recent Outpatient Visits          2 months ago Essential (primary) hypertension   [employer], [place], PA-C   7 months ago Acute cystitis without hematuria   [facility] Mikey Kirschner, PA-C   7 months ago [place], Jake Church, DO   1 year ago Encounter for annual wellness visit (AWV) in Medicare patient   [facility] [place], Dionne Bucy, MD   1 year ago Frequency of urination   [place] [facility], Avanti, MD      Future Appointments            In 3 months Bacigalupo, Dionne Bucy, MD [facility], [place]

## 2022-05-17 ENCOUNTER — Other Ambulatory Visit: Payer: Self-pay | Admitting: Family Medicine

## 2022-05-29 DIAGNOSIS — R208 Other disturbances of skin sensation: Secondary | ICD-10-CM | POA: Diagnosis not present

## 2022-05-29 DIAGNOSIS — L918 Other hypertrophic disorders of the skin: Secondary | ICD-10-CM | POA: Diagnosis not present

## 2022-05-29 DIAGNOSIS — L538 Other specified erythematous conditions: Secondary | ICD-10-CM | POA: Diagnosis not present

## 2022-08-27 ENCOUNTER — Other Ambulatory Visit: Payer: Self-pay | Admitting: Physician Assistant

## 2022-08-27 DIAGNOSIS — I10 Essential (primary) hypertension: Secondary | ICD-10-CM

## 2022-09-06 ENCOUNTER — Ambulatory Visit: Payer: Medicare PPO | Admitting: Family Medicine

## 2022-09-12 ENCOUNTER — Other Ambulatory Visit: Payer: Self-pay | Admitting: Family Medicine

## 2022-09-13 ENCOUNTER — Other Ambulatory Visit: Payer: Self-pay | Admitting: Physician Assistant

## 2022-09-13 DIAGNOSIS — I10 Essential (primary) hypertension: Secondary | ICD-10-CM

## 2022-09-13 NOTE — Telephone Encounter (Signed)
Requested Prescriptions  Pending Prescriptions Disp Refills   bisoprolol-hydrochlorothiazide (ZIAC) 5-6.25 MG tablet [Pharmacy Med Name: BISOPROLOL-HCTZ 5-6.25 MG TAB] 90 tablet 0    Sig: TAKE 1 TABLET BY MOUTH EVERY DAY     Cardiovascular: Beta Blocker + Diuretic Combos Failed - 09/13/2022 11:17 AM      Failed - K in normal range and within 180 days    Potassium  Date Value Ref Range Status  03/06/2022 5.1 3.5 - 5.2 mmol/L Final         Failed - Na in normal range and within 180 days    Sodium  Date Value Ref Range Status  03/06/2022 138 134 - 144 mmol/L Final         Failed - Cr in normal range and within 180 days    Creatinine, Ser  Date Value Ref Range Status  03/06/2022 1.15 (H) 0.57 - 1.00 mg/dL Final         Failed - eGFR in normal range and within 180 days    GFR calc Af Amer  Date Value Ref Range Status  06/09/2020 50 (L) >59 mL/min/1.73 Final    Comment:    **In accordance with recommendations from the NKF-ASN Task force,**   [facility] is in the process of updating its eGFR calculation to the   2021 CKD-EPI creatinine equation that estimates kidney function   without a race variable.    GFR calc non Af Amer  Date Value Ref Range Status  06/09/2020 43 (L) >59 mL/min/1.73 Final   eGFR  Date Value Ref Range Status  03/06/2022 49 (L) >59 mL/min/1.73 Final         Passed - Last BP in normal range    BP Readings from Last 1 Encounters:  03/06/22 119/68         Passed - Last Heart Rate in normal range    Pulse Readings from Last 1 Encounters:  03/06/22 64         Passed - Valid encounter within last 6 months    Recent Outpatient Visits           6 months ago Essential (primary) hypertension   [facility] [place], [place], PA-C   11 months ago Acute cystitis without hematuria   [place] Mikey Kirschner, PA-C   11 months ago [place], Jake Church, DO   1  year ago Encounter for annual wellness visit (AWV) in Medicare patient   [place], Dionne Bucy, MD   1 year ago Frequency of urination   [place] Vigg, Avanti, MD       Future Appointments             In 3 weeks Bacigalupo, Dionne Bucy, MD [facility], [facility]

## 2022-09-19 ENCOUNTER — Other Ambulatory Visit: Payer: Self-pay | Admitting: Physician Assistant

## 2022-09-19 DIAGNOSIS — M5432 Sciatica, left side: Secondary | ICD-10-CM

## 2022-09-25 ENCOUNTER — Telehealth (INDEPENDENT_AMBULATORY_CARE_PROVIDER_SITE_OTHER): Payer: Medicare PPO | Admitting: Family Medicine

## 2022-09-25 ENCOUNTER — Encounter: Payer: Self-pay | Admitting: Family Medicine

## 2022-09-25 DIAGNOSIS — N3090 Cystitis, unspecified without hematuria: Secondary | ICD-10-CM | POA: Diagnosis not present

## 2022-09-25 MED ORDER — SULFAMETHOXAZOLE-TRIMETHOPRIM 800-160 MG PO TABS: 1.0000 | ORAL_TABLET | Freq: Two times a day (BID) | ORAL | 0 refills | Status: AC

## 2022-09-25 NOTE — Progress Notes (Signed)
MyChart Video Visit    Virtual Visit via Video Note   This format is felt to be most appropriate for this patient at this time. Physical exam was limited by quality of the video and audio technology used for the visit.    Patient location: home Provider location: [place] involved in the visit: patient, provider  I discussed the limitations of evaluation and management by telemedicine and the availability of in person appointments. The patient expressed understanding and agreed to proceed.  Patient: Tara Potter   DOB: September 06, 1945   77 y.o. Female  MRN: [id number] Visit Date: 09/25/2022  Today's healthcare provider: [name], MD   Chief Complaint  Patient presents with   Urinary Frequency   Subjective    Urinary Frequency  Associated symptoms include frequency and urgency. Pertinent negatives include no chills, flank pain, hematuria, nausea or vomiting.    Urinary symptoms  She reports new onset dysuria, flank pain, urinary frequency, and urinating more at night . The current episode started a few days ago and is staying constant. Patient states symptoms are mild in intensity, occurring  every night . She  has not been recently treated for similar symptoms.   X3 days Started as pelvic pressure, then urinary frequency/urgency  Trying water and AZO Feels like previous UTI No hematuria, no fever ---------------------------------------------------------------------------------------    Medications: Outpatient Medications Prior to Visit  Medication Sig   acetaminophen (TYLENOL) 650 MG CR tablet Take 650 mg by mouth in the morning and at bedtime.   atorvastatin (LIPITOR) 10 MG tablet TAKE 1 TABLET BY MOUTH EVERY DAY   bisoprolol-hydrochlorothiazide (ZIAC) 5-6.25 MG tablet TAKE 1 TABLET BY MOUTH EVERY DAY   clobetasol cream (TEMOVATE) AB-123456789 % Apply 1 application. topically 2 (two) times daily.   [facility] ACETAMINOPHEN 325 MG CAPS     diclofenac (VOLTAREN) 75 MG EC tablet TAKE 1 TABLET BY MOUTH TWICE A DAY   diclofenac Sodium (VOLTAREN) 1 % GEL Apply topically daily.   estradiol (ESTRACE VAGINAL) 0.1 MG/GM vaginal cream Place 1 Applicatorful vaginally 3 (three) times a week.   famotidine (PEPCID) 20 MG tablet Take 1 tablet by mouth twice daily (Patient taking differently: as needed.)   fluticasone (FLONASE) 50 MCG/ACT nasal spray Use 2 spray(s) in each nostril once daily   gabapentin (NEURONTIN) 100 MG capsule TAKE 1 CAPSULE BY MOUTH AT BEDTIME.   levothyroxine (SYNTHROID) 75 MCG tablet TAKE 1 TABLET BY MOUTH EVERY DAY   loratadine (CLARITIN) 10 MG tablet Take 10 mg by mouth daily as needed for allergies.   Multiple Vitamins-Minerals (MULTIVITAL PO) Take by mouth daily. When remembers   aspirin EC 81 MG tablet Take 81 mg by mouth every other day. (Patient not taking: Reported on 04/26/2022)   No facility-administered medications prior to visit.    Review of Systems  Constitutional:  Positive for fatigue. Negative for chills and fever.  Gastrointestinal:  Negative for abdominal pain, nausea and vomiting.  Genitourinary:  Positive for dysuria, enuresis, frequency and urgency. Negative for flank pain and hematuria.       Objective    There were no vitals taken for this visit.     Physical Exam Constitutional:      General: She is not in acute distress.    Appearance: Normal appearance.  HENT:     Head: Normocephalic.  Pulmonary:     Effort: Pulmonary effort is normal. No respiratory distress.  Neurological:     Mental Status: She  is alert and oriented to person, place, and time. Mental status is at baseline.        Assessment & Plan     1. Cystitis - Symptoms consistent with UTI, not able to collect UA today -No systemic symptoms or signs of pyelonephritis -Will start treatment with 7day course of Bactrim after reviewing previous urine culture  -Discussed return precautions   Meds ordered this  encounter  Medications   sulfamethoxazole-trimethoprim (BACTRIM DS) 800-160 MG tablet    Sig: Take 1 tablet by mouth 2 (two) times daily for 7 days.    Dispense:  14 tablet    Refill:  0     Return if symptoms worsen or fail to improve.     I discussed the assessment and treatment plan with the patient. The patient was provided an opportunity to ask questions and all were answered. The patient agreed with the plan and demonstrated an understanding of the instructions.   The patient was advised to call back or seek an in-person evaluation if the symptoms worsen or if the condition fails to improve as anticipated.  I, [name], MD, have reviewed all documentation for this visit. The documentation on 09/25/22 for the exam, diagnosis, procedures, and orders are all accurate and complete.   Deaisa Merida, Dionne Bucy, MD, MPH [facility]

## 2022-09-26 ENCOUNTER — Other Ambulatory Visit: Payer: Self-pay | Admitting: Family Medicine

## 2022-09-26 DIAGNOSIS — I10 Essential (primary) hypertension: Secondary | ICD-10-CM

## 2022-10-08 ENCOUNTER — Ambulatory Visit: Payer: Medicare PPO | Admitting: Family Medicine

## 2022-10-16 ENCOUNTER — Encounter: Payer: Self-pay | Admitting: Family Medicine

## 2022-10-16 ENCOUNTER — Ambulatory Visit: Payer: Medicare PPO | Admitting: Family Medicine

## 2022-10-16 VITALS — BP 124/81 | HR 65 | Temp 98.0°F | Resp 16 | Wt 292.9 lb

## 2022-10-16 DIAGNOSIS — E039 Hypothyroidism, unspecified: Secondary | ICD-10-CM | POA: Diagnosis not present

## 2022-10-16 DIAGNOSIS — I10 Essential (primary) hypertension: Secondary | ICD-10-CM

## 2022-10-16 DIAGNOSIS — R7303 Prediabetes: Secondary | ICD-10-CM

## 2022-10-16 DIAGNOSIS — E7800 Pure hypercholesterolemia, unspecified: Secondary | ICD-10-CM | POA: Diagnosis not present

## 2022-10-16 MED ORDER — LEVOTHYROXINE SODIUM 75 MCG PO TABS: 75.0000 ug | ORAL_TABLET | Freq: Every day | ORAL | 1 refills | Status: DC

## 2022-10-16 NOTE — Assessment & Plan Note (Signed)
Discussed importance of healthy weight management Discussed diet and exercise  

## 2022-10-16 NOTE — Progress Notes (Signed)
Tara Potter,Sulibeya S Dimas,acting as a Education administrator for Tara Paganini, MD.,have documented all relevant documentation on the behalf of Tara Paganini, MD,as directed by  Tara Paganini, MD while in the presence of Tara Paganini, MD.    Established patient visit   Patient: Tara Potter   DOB: [birth year]   77 y.o. Female  MRN: [id number] Visit Date: 10/16/2022  Today's healthcare provider: [name], MD   Chief Complaint  Patient presents with   Hypertension   Hyperlipidemia   Hypothyroidism   Subjective    HPI  Hypertension, follow-up  BP Readings from Last 3 Encounters:  10/16/22 124/81  03/06/22 119/68  10/17/21 134/63   Wt Readings from Last 3 Encounters:  10/16/22 292 lb 14.4 oz (132.9 kg)  04/26/22 291 lb (132 kg)  03/06/22 291 lb (132 kg)     She was last seen for hypertension 6 months ago.  BP at that visit was 119/68. Management since that visit includes no changes. Continue Bisoprolol-HCTZ 5-6.25 mg daily.   She reports good compliance with treatment. She is not having side effects.    Outside blood pressures are not being checked. Symptoms: No chest pain  chest pressure  No palpitations No syncope  No dyspnea No orthopnea  No paroxysmal nocturnal dyspnea No lower extremity edema   Pertinent labs Lab Results  Component Value Date   CHOL 207 (H) 03/06/2022   HDL 46 03/06/2022   LDLCALC 128 (H) 03/06/2022   TRIG 186 (H) 03/06/2022   CHOLHDL 4.5 (H) 03/06/2022   Lab Results  Component Value Date   NA 138 03/06/2022   K 5.1 03/06/2022   CREATININE 1.15 (H) 03/06/2022   EGFR 49 (L) 03/06/2022   GLUCOSE 88 03/06/2022   TSH 4.250 03/06/2022     The 10-year ASCVD risk score (Arnett DK, et al., 2019) is: 23.9%  --------------------------------------------------------------------------------------------------- Lipid/Cholesterol, Follow-up  Last lipid panel Other pertinent labs  Lab Results  Component Value Date   CHOL 207 (H) 03/06/2022    HDL 46 03/06/2022   LDLCALC 128 (H) 03/06/2022   TRIG 186 (H) 03/06/2022   CHOLHDL 4.5 (H) 03/06/2022   Lab Results  Component Value Date   ALT 13 03/06/2022   AST 12 03/06/2022   PLT 259 03/06/2022   TSH 4.250 03/06/2022     She was last seen for this 7 months ago.  Management since that visit includes no changes continue atorvastatin 10 mg daily.  She reports good compliance with treatment. She is not having side effects.   Symptoms: No chest pain No chest pressure/discomfort  No dyspnea No lower extremity edema  No numbness or tingling of extremity No orthopnea  No palpitations No paroxysmal nocturnal dyspnea  No speech difficulty No syncope    The 10-year ASCVD risk score (Arnett DK, et al., 2019) is: 23.9%  --------------------------------------------------------------------------------------------------- Hypothyroid, follow-up  Lab Results  Component Value Date   TSH 4.250 03/06/2022   TSH 2.250 04/06/2021   TSH 3.100 06/09/2020   FREET4 1.49 03/06/2022    Wt Readings from Last 3 Encounters:  10/16/22 292 lb 14.4 oz (132.9 kg)  04/26/22 291 lb (132 kg)  03/06/22 291 lb (132 kg)    She was last seen for hypothyroid 7 months ago.  Management since that visit includes no changes. She reports good compliance with treatment. She is not having side effects.   Symptoms: Nochange in energy level No constipation  No diarrhea No heat / cold intolerance  No nervousness No  palpitations  No weight changes    -----------------------------------------------------------------------------------------   Medications: Outpatient Medications Prior to Visit  Medication Sig   acetaminophen (TYLENOL) 650 MG CR tablet Take 650 mg by mouth in the morning and at bedtime.   atorvastatin (LIPITOR) 10 MG tablet TAKE 1 TABLET BY MOUTH EVERY DAY   bisoprolol-hydrochlorothiazide (ZIAC) 5-6.25 MG tablet TAKE 1 TABLET BY MOUTH EVERY DAY   clobetasol cream (TEMOVATE) AB-123456789 %  Apply 1 application. topically 2 (two) times daily.   [facility] ACETAMINOPHEN 325 MG CAPS    diclofenac (VOLTAREN) 75 MG EC tablet TAKE 1 TABLET BY MOUTH TWICE A DAY   diclofenac Sodium (VOLTAREN) 1 % GEL Apply topically daily.   estradiol (ESTRACE VAGINAL) 0.1 MG/GM vaginal cream Place 1 Applicatorful vaginally 3 (three) times a week.   famotidine (PEPCID) 20 MG tablet Take 1 tablet by mouth twice daily (Patient taking differently: as needed.)   fluticasone (FLONASE) 50 MCG/ACT nasal spray Use 2 spray(s) in each nostril once daily   gabapentin (NEURONTIN) 100 MG capsule TAKE 1 CAPSULE BY MOUTH AT BEDTIME.   loratadine (CLARITIN) 10 MG tablet Take 10 mg by mouth daily as needed for allergies.   Multiple Vitamins-Minerals (MULTIVITAL PO) Take by mouth daily. When remembers   [DISCONTINUED] levothyroxine (SYNTHROID) 75 MCG tablet TAKE 1 TABLET BY MOUTH EVERY DAY   aspirin EC 81 MG tablet Take 81 mg by mouth every other day. (Patient not taking: Reported on 10/16/2022)   No facility-administered medications prior to visit.    Review of Systems  Constitutional:  Negative for activity change, appetite change and fatigue.  Respiratory:  Negative for cough, chest tightness and shortness of breath.   Cardiovascular:  Negative for chest pain, palpitations and leg swelling.  Gastrointestinal:  Negative for abdominal pain, constipation, diarrhea, nausea and vomiting.  Endocrine: Negative for cold intolerance and heat intolerance.  Neurological:  Negative for dizziness, light-headedness, numbness and headaches.       Objective    BP 124/81 (BP Location: Left Arm, Patient Position: Sitting, Cuff Size: Normal)   Pulse 65   Temp 98 F (36.7 C) (Temporal)   Resp 16   Wt 292 lb 14.4 oz (132.9 kg)   SpO2 100%   BMI 43.25 kg/m    Physical Exam Vitals reviewed.  Constitutional:      General: She is not in acute distress.    Appearance: Normal appearance. She is well-developed. She is not diaphoretic.   HENT:     Head: Normocephalic and atraumatic.  Eyes:     General: No scleral icterus.    Conjunctiva/sclera: Conjunctivae normal.  Neck:     Thyroid: No thyromegaly.  Cardiovascular:     Rate and Rhythm: Normal rate and regular rhythm.     Pulses: Normal pulses.     Heart sounds: Normal heart sounds. No murmur heard. Pulmonary:     Effort: Pulmonary effort is normal. No respiratory distress.     Breath sounds: Normal breath sounds. No wheezing, rhonchi or rales.  Musculoskeletal:     Cervical back: Neck supple.     Right lower leg: No edema.     Left lower leg: No edema.  Lymphadenopathy:     Cervical: No cervical adenopathy.  Skin:    General: Skin is warm and dry.     Findings: No rash.  Neurological:     Mental Status: She is alert and oriented to person, place, and time. Mental status is at baseline.  Psychiatric:  Mood and Affect: Mood normal.        Behavior: Behavior normal.      No results found for any visits on 10/16/22.  Assessment & Plan     Problem List Items Addressed This Visit       Cardiovascular and Mediastinum   Essential (primary) hypertension - Primary    Well controlled Continue current medications Recheck metabolic panel F/u in 6 months       Relevant Medications   levothyroxine (SYNTHROID) 75 MCG tablet   Other Relevant Orders   Comprehensive metabolic panel     Endocrine   Adult hypothyroidism    Previously well controlled Continue Synthroid at current dose  Recheck TSH and adjust Synthroid as indicated        Relevant Medications   levothyroxine (SYNTHROID) 75 MCG tablet   Other Relevant Orders   TSH     Other   HLD (hyperlipidemia)    Previously well controlled Continue statin Repeat FLP and CMP       Relevant Orders   Comprehensive metabolic panel   Lipid panel   Prediabetes    Recommend low carb diet Recheck A1c       Relevant Orders   Hemoglobin A1c   Morbid obesity ([place])    Discussed importance of  healthy weight management Discussed diet and exercise         Return in about 6 months (around 04/18/2023) for CPE.      Tara Potter, Tara Paganini, MD, have reviewed all documentation for this visit. The documentation on 10/16/22 for the exam, diagnosis, procedures, and orders are all accurate and complete.   Judene Logue, Dionne Bucy, MD, MPH [facility]

## 2022-10-16 NOTE — Assessment & Plan Note (Signed)
Previously well controlled Continue Synthroid at current dose  Recheck TSH and adjust Synthroid as indicated   

## 2022-10-16 NOTE — Assessment & Plan Note (Signed)
Previously well controlled Continue statin Repeat FLP and CMP  

## 2022-10-16 NOTE — Assessment & Plan Note (Signed)
Well controlled Continue current medications Recheck metabolic panel F/u in 6 months  

## 2022-10-16 NOTE — Assessment & Plan Note (Signed)
Recommend low carb diet °Recheck A1c  °

## 2022-10-17 LAB — HEMOGLOBIN A1C
Est. average glucose Bld gHb Est-mCnc: 114 mg/dL
Hgb A1c MFr Bld: 5.6 % (ref 4.8–5.6)

## 2022-10-17 LAB — COMPREHENSIVE METABOLIC PANEL
ALT: 17 IU/L (ref 0–32)
AST: 14 IU/L (ref 0–40)
Albumin/Globulin Ratio: 2.1 (ref 1.2–2.2)
Albumin: 4.4 g/dL (ref 3.8–4.8)
Alkaline Phosphatase: 136 IU/L — ABNORMAL HIGH (ref 44–121)
BUN/Creatinine Ratio: 25 (ref 12–28)
BUN: 28 mg/dL — ABNORMAL HIGH (ref 8–27)
Bilirubin Total: 0.3 mg/dL (ref 0.0–1.2)
CO2: 24 mmol/L (ref 20–29)
Calcium: 10 mg/dL (ref 8.7–10.3)
Chloride: 101 mmol/L (ref 96–106)
Creatinine, Ser: 1.14 mg/dL — ABNORMAL HIGH (ref 0.57–1.00)
Globulin, Total: 2.1 g/dL (ref 1.5–4.5)
Glucose: 84 mg/dL (ref 70–99)
Potassium: 4.5 mmol/L (ref 3.5–5.2)
Sodium: 140 mmol/L (ref 134–144)
Total Protein: 6.5 g/dL (ref 6.0–8.5)
eGFR: 50 mL/min/{1.73_m2} — ABNORMAL LOW (ref >59)

## 2022-10-17 LAB — LIPID PANEL
Chol/HDL Ratio: 4.2 ratio (ref 0.0–4.4)
Cholesterol, Total: 197 mg/dL (ref 100–199)
HDL: 47 mg/dL (ref >39)
LDL Chol Calc (NIH): 115 mg/dL — ABNORMAL HIGH (ref 0–99)
Triglycerides: 201 mg/dL — ABNORMAL HIGH (ref 0–149)
VLDL Cholesterol Cal: 35 mg/dL (ref 5–40)

## 2022-10-17 LAB — TSH: TSH: 3.55 u[IU]/mL (ref 0.450–4.500)

## 2022-10-29 ENCOUNTER — Telehealth: Payer: Self-pay

## 2022-10-29 NOTE — Telephone Encounter (Signed)
Copied from [place] [id number]. Topic: General - Other >> Oct 29, 2022  2:00 PM Dominique A wrote: Reason for CRM: Pt states that she was in the office two weeks ago to see her PCP and she forgot to bring her form for her PCP to fill out for her handicap sticker renewal. Pt states that it is difficult for her to come to the office and is wanting to know if PCP could email her a form showing that she can get her handicap sticker renewed. Please call pt back.

## 2022-10-31 ENCOUNTER — Telehealth: Payer: Self-pay | Admitting: Family Medicine

## 2022-10-31 ENCOUNTER — Ambulatory Visit: Payer: Self-pay

## 2022-10-31 NOTE — Telephone Encounter (Signed)
Refuses triage. "I just want to bring in the paper work for my handicap sticker today, if someone could come down stairs and get it when I get there." Will call when she arrives.

## 2022-10-31 NOTE — Telephone Encounter (Signed)
Pt called and will come by office to drop off handicap form and would need someone to come down stairs and get it from her / she is not able to walk too well today / pt will call when she has arrived outside

## 2022-10-31 NOTE — Telephone Encounter (Signed)
noted 

## 2022-10-31 NOTE — Telephone Encounter (Signed)
Please inform patient to pick up handicap form

## 2022-11-01 NOTE — Telephone Encounter (Signed)
Left message that form is ready for pick up

## 2022-12-05 ENCOUNTER — Other Ambulatory Visit: Payer: Self-pay | Admitting: Family Medicine

## 2022-12-05 DIAGNOSIS — I10 Essential (primary) hypertension: Secondary | ICD-10-CM

## 2022-12-07 ENCOUNTER — Ambulatory Visit: Payer: Medicare PPO | Admitting: Physician Assistant

## 2022-12-07 ENCOUNTER — Encounter: Payer: Self-pay | Admitting: Physician Assistant

## 2022-12-07 VITALS — BP 124/69 | HR 62 | Temp 97.9°F

## 2022-12-07 DIAGNOSIS — R350 Frequency of micturition: Secondary | ICD-10-CM | POA: Diagnosis not present

## 2022-12-07 NOTE — Progress Notes (Signed)
Established patient visit   Patient: Tara Potter   DOB: 1945-12-14   77 y.o. Female  MRN: [id number] Visit Date: 12/07/2022  Today's healthcare provider: Alfredia Ferguson, PA-C   Cc. Urinary frequency, urgency x 1-2 days  Subjective    HPI  Patient is a 77 year old female who presents with possible UTI symptoms.  She states she had frequency and pain with urination beginning yesterday morning.  She started on AZO yesterday and last night night and said today she hasn't had any symptoms at all.    Denies dysuria, frequency today. Reports some irritation externally that she uses prn monistat for. She reports her last trigger was lavender scented toilet paper.   Medications: Outpatient Medications Prior to Visit  Medication Sig   acetaminophen (TYLENOL) 650 MG CR tablet Take 650 mg by mouth in the morning and at bedtime.   atorvastatin (LIPITOR) 10 MG tablet TAKE 1 TABLET BY MOUTH EVERY DAY   bisoprolol-hydrochlorothiazide (ZIAC) 5-6.25 MG tablet TAKE 1 TABLET BY MOUTH EVERY DAY   clobetasol cream (TEMOVATE) 0.05 % Apply 1 application. topically 2 (two) times daily.   [facility] ACETAMINOPHEN 325 MG CAPS    diclofenac (VOLTAREN) 75 MG EC tablet TAKE 1 TABLET BY MOUTH TWICE A DAY   diclofenac Sodium (VOLTAREN) 1 % GEL Apply topically daily.   estradiol (ESTRACE VAGINAL) 0.1 MG/GM vaginal cream Place 1 Applicatorful vaginally 3 (three) times a week.   famotidine (PEPCID) 20 MG tablet Take 1 tablet by mouth twice daily (Patient taking differently: as needed.)   fluticasone (FLONASE) 50 MCG/ACT nasal spray Use 2 spray(s) in each nostril once daily   gabapentin (NEURONTIN) 100 MG capsule TAKE 1 CAPSULE BY MOUTH AT BEDTIME.   levothyroxine (SYNTHROID) 75 MCG tablet Take 1 tablet (75 mcg total) by mouth daily.   loratadine (CLARITIN) 10 MG tablet Take 10 mg by mouth daily as needed for allergies.   Multiple Vitamins-Minerals (MULTIVITAL PO) Take by mouth daily. When remembers   aspirin EC 81  MG tablet Take 81 mg by mouth every other day. (Patient not taking: Reported on 10/16/2022)   No facility-administered medications prior to visit.    Review of Systems  Constitutional:  Negative for fatigue and fever.  Respiratory:  Negative for cough and shortness of breath.   Cardiovascular:  Negative for chest pain and leg swelling.  Gastrointestinal:  Negative for abdominal pain.  Genitourinary:  Positive for frequency and urgency.  Neurological:  Negative for dizziness and headaches.      Objective    BP 124/69 (BP Location: Left Arm, Patient Position: Sitting, Cuff Size: Large)   Pulse 62   Temp 97.9 F (36.6 C) (Oral)   SpO2 94%    Physical Exam Vitals reviewed.  Constitutional:      Appearance: She is not ill-appearing.  HENT:     Head: Normocephalic.  Eyes:     Conjunctiva/sclera: Conjunctivae normal.  Cardiovascular:     Rate and Rhythm: Normal rate.  Pulmonary:     Effort: Pulmonary effort is normal. No respiratory distress.  Neurological:     General: No focal deficit present.     Mental Status: She is alert and oriented to person, place, and time.  Psychiatric:        Mood and Affect: Mood normal.        Behavior: Behavior normal.      No results found for any visits on 12/07/22.  Assessment & Plan  1. Urinary frequency Given symptoms are resolved will hold off on empiric tx. Unable to do poc d/t azo use Advised over the weekend, prn azo.  Will contact pt with culture results.  Avoid scented items/creams on the external vulvar area.  - Urine Culture  Return if symptoms worsen or fail to improve.      I, Alfredia Ferguson, PA-C have reviewed all documentation for this visit. The documentation on  12/07/22   for the exam, diagnosis, procedures, and orders are all accurate and complete.  Alfredia Ferguson, PA-C [facility] [address] [place], [place], [address] Office: [phone] Fax: [phone]   [facility]

## 2022-12-10 LAB — URINE CULTURE

## 2023-01-14 ENCOUNTER — Other Ambulatory Visit: Payer: Self-pay | Admitting: Family Medicine

## 2023-03-16 ENCOUNTER — Other Ambulatory Visit: Payer: Self-pay | Admitting: Family Medicine

## 2023-03-16 DIAGNOSIS — M5432 Sciatica, left side: Secondary | ICD-10-CM

## 2023-03-16 DIAGNOSIS — I10 Essential (primary) hypertension: Secondary | ICD-10-CM

## 2023-04-18 ENCOUNTER — Encounter: Payer: Medicare PPO | Admitting: Family Medicine

## 2023-04-19 DIAGNOSIS — L821 Other seborrheic keratosis: Secondary | ICD-10-CM | POA: Diagnosis not present

## 2023-04-19 DIAGNOSIS — L218 Other seborrheic dermatitis: Secondary | ICD-10-CM | POA: Diagnosis not present

## 2023-04-19 DIAGNOSIS — L708 Other acne: Secondary | ICD-10-CM | POA: Diagnosis not present

## 2023-04-25 ENCOUNTER — Other Ambulatory Visit: Payer: Self-pay | Admitting: Family Medicine

## 2023-04-25 DIAGNOSIS — I10 Essential (primary) hypertension: Secondary | ICD-10-CM

## 2023-04-29 ENCOUNTER — Telehealth: Payer: Self-pay

## 2023-04-29 ENCOUNTER — Ambulatory Visit (INDEPENDENT_AMBULATORY_CARE_PROVIDER_SITE_OTHER): Payer: Medicare PPO

## 2023-04-29 VITALS — Ht 68.5 in | Wt 292.0 lb

## 2023-04-29 DIAGNOSIS — Z0000 Encounter for general adult medical examination without abnormal findings: Secondary | ICD-10-CM | POA: Diagnosis not present

## 2023-04-29 NOTE — Telephone Encounter (Signed)
Copied from CRM [id number]. Topic: General - Other >> Apr 29, 2023 11:03 AM Franchot Heidelberg wrote: Reason for CRM: Pt called reporting that she received instructions to complete a questionnaire prior to her AWV, she does not know how to access these questions. Please advise   Best contact: [phone] >> Apr 29, 2023 11:09 AM Franchot Heidelberg wrote: Disregard!

## 2023-04-29 NOTE — Progress Notes (Signed)
Subjective:   Tara Potter is a 77 y.o. female who presents for Medicare Annual (Subsequent) preventive examination.  Visit Complete: Virtual  I connected with  Tara Potter on 04/29/23 by a audio enabled telemedicine application and verified that I am speaking with the correct person using two identifiers.  Patient Location: Home  Provider Location: Office/Clinic  I discussed the limitations of evaluation and management by telemedicine. The patient expressed understanding and agreed to proceed.  Vital Signs: Unable to obtain new vitals due to this being a telehealth visit.  Patient Medicare AWV questionnaire was completed by the patient on (not done); I have confirmed that all information answered by patient is correct and no changes since this date. Cardiac Risk Factors include: advanced age (>34men, >35 women);dyslipidemia;hypertension;obesity (BMI >30kg/m2);sedentary lifestyle    Objective:    Today's Vitals   04/29/23 1348 04/29/23 1350  Weight: 292 lb (132.5 kg)   Height: 5' 8.5" (1.74 m)   PainSc:  2    Body mass index is 43.75 kg/m.     04/29/2023    2:13 PM 04/26/2022    1:06 PM 06/22/2020   11:30 AM 03/10/2020   10:32 AM 02/10/2020   11:08 AM 02/02/2020    1:19 PM 09/02/2019   11:14 AM  Advanced Directives  Does Patient Have a Medical Advance Directive? No No No No No No No  Would patient like information on creating a medical advance directive?  No - Patient declined No - Patient declined   No - Patient declined No - Patient declined    Current Medications (verified) Outpatient Encounter Medications as of 04/29/2023  Medication Sig   acetaminophen (TYLENOL) 650 MG CR tablet Take 650 mg by mouth in the morning and at bedtime.   atorvastatin (LIPITOR) 10 MG tablet TAKE 1 TABLET BY MOUTH EVERY DAY   bisoprolol-hydrochlorothiazide (ZIAC) 5-6.25 MG tablet TAKE 1 TABLET BY MOUTH EVERY DAY   clobetasol cream (TEMOVATE) 0.05 % Apply 1 application. topically 2 (two)  times daily.   [facility] ACETAMINOPHEN 325 MG CAPS    diclofenac (VOLTAREN) 75 MG EC tablet TAKE 1 TABLET BY MOUTH TWICE A DAY   diclofenac Sodium (VOLTAREN) 1 % GEL Apply topically daily.   estradiol (ESTRACE VAGINAL) 0.1 MG/GM vaginal cream Place 1 Applicatorful vaginally 3 (three) times a week.   famotidine (PEPCID) 20 MG tablet Take 1 tablet by mouth twice daily (Patient taking differently: as needed.)   fluticasone (FLONASE) 50 MCG/ACT nasal spray Use 2 spray(s) in each nostril once daily   gabapentin (NEURONTIN) 100 MG capsule TAKE 1 CAPSULE BY MOUTH EVERYDAY AT BEDTIME   levothyroxine (SYNTHROID) 75 MCG tablet TAKE 1 TABLET BY MOUTH EVERY DAY   loratadine (CLARITIN) 10 MG tablet Take 10 mg by mouth daily as needed for allergies.   Multiple Vitamins-Minerals (MULTIVITAL PO) Take by mouth daily. When remembers   aspirin EC 81 MG tablet Take 81 mg by mouth every other day. (Patient not taking: Reported on 10/16/2022)   No facility-administered encounter medications on file as of 04/29/2023.    Allergies (verified) Augmentin [amoxicillin-pot clavulanate], Amoxicillin, Entex t [pseudoephedrine-guaifenesin], and Erythromycin   History: Past Medical History:  Diagnosis Date   Arthritis    Hyperlipidemia    Hypertension    Past Surgical History:  Procedure Laterality Date   DILATION AND CURETTAGE OF UTERUS     TUBAL LIGATION     Family History  Problem Relation Age of Onset   Hypertension Mother  Macular degeneration Mother    Atrial fibrillation Mother    Kidney disease Mother    Heart disease Father    Arthritis Sister    Breast cancer Neg Hx    Social History   Socioeconomic History   Marital status: Widowed    Spouse name: Not on file   Number of children: 2   Years of education: Not on file   Highest education level: Bachelor's degree (e.g., BA, AB, BS)  Occupational History   Occupation: retired  Tobacco Use   Smoking status: Never   Smokeless tobacco: Never   Vaping Use   Vaping status: Never Used  Substance and Sexual Activity   Alcohol use: Not Currently   Drug use: No   Sexual activity: Not on file  Other Topics Concern   Not on file  Social History Narrative   Not on file   Social Determinants of Health   Financial Resource Strain: Low Risk  (04/26/2022)   Overall Financial Resource Strain (CARDIA)    Difficulty of Paying Living Expenses: Not hard at all  Food Insecurity: No Food Insecurity (04/29/2023)   Hunger Vital Sign    Worried About Running Out of Food in the Last Year: Never true    Ran Out of Food in the Last Year: Never true  Transportation Needs: No Transportation Needs (04/29/2023)   PRAPARE - Administrator, Civil Service (Medical): No    Lack of Transportation (Non-Medical): No  Physical Activity: Inactive (04/29/2023)   Exercise Vital Sign    Days of Exercise per Week: 0 days    Minutes of Exercise per Session: 0 min  Stress: No Stress Concern Present (04/29/2023)   [employer] of Occupational Health - Occupational Stress Questionnaire    Feeling of Stress : Only a little  Social Connections: Socially Isolated (04/29/2023)   Social Connection and Isolation Panel [NHANES]    Frequency of Communication with Friends and Family: More than three times a week    Frequency of Social Gatherings with Friends and Family: Once a week    Attends Religious Services: Never    Database administrator or Organizations: No    Attends Banker Meetings: Never    Marital Status: Widowed    Tobacco Counseling Counseling given: Not Answered   Clinical Intake:  Pre-visit preparation completed: Yes  Pain : 0-10 Pain Score: 2  Pain Type: Chronic pain Pain Location: Knee (both knees and lower back) Pain Descriptors / Indicators: Aching Pain Onset: More than a month ago Pain Frequency: Constant Pain Relieving Factors: hot bath, diclofenac cream, rest legs  Pain Relieving Factors: hot bath,  diclofenac cream, rest legs  BMI - recorded: 43.75 Nutritional Status: BMI > 30  Obese Nutritional Risks: None Diabetes: No  How often do you need to have someone help you when you read instructions, pamphlets, or other written materials from your doctor or pharmacy?: 1 - Never  Interpreter Needed?: No  Comments: lives alone Information entered by :: B.Raylynne Cubbage,LPN   Activities of Daily Living    04/29/2023    2:13 PM 10/16/2022    3:38 PM  In your present state of health, do you have any difficulty performing the following activities:  Hearing? 1 1  Vision? 0 0  Difficulty concentrating or making decisions? 0 0  Walking or climbing stairs? 1 1  Dressing or bathing? 0 0  Doing errands, shopping? 0 1  Preparing Food and eating ? N   Using  the Toilet? N   In the past six months, have you accidently leaked urine? Y   Do you have problems with loss of bowel control? N   Managing your Medications? N   Managing your Finances? N   Housekeeping or managing your Housekeeping? N     Patient Care Team: Erasmo Downer, MD as PCP - General (Family Medicine) Dingeldein, Viviann Spare, MD as Consulting Physician (Ophthalmology)  Indicate any recent Medical Services you may have received from other than [facility] providers in the past year (date may be approximate).     Assessment:   This is a routine wellness examination for Berthel.  Hearing/Vision screen Hearing Screening - Comments:: Adequate hearing:wear hearing aides (don't work well) Vision Screening - Comments:: Adequate vision w/glasses   Goals Addressed             This Visit's Progress    DIET - EAT MORE FRUITS AND VEGETABLES   Not on track    COMPLETED: Reduce sugar in daily diet   On track    Recommend cutting out all extra sugars in daily diet, no more sweets.        Depression Screen    04/29/2023    2:06 PM 10/16/2022    3:37 PM 04/26/2022    1:03 PM 03/06/2022    2:00 PM 04/06/2021    2:20 PM 06/22/2020    11:30 AM 03/10/2020   10:37 AM  PHQ 2/9 Scores  PHQ - 2 Score 2 2 2 2 2  0 0  PHQ- 9 Score 4 7 4 4 4       Fall Risk    04/29/2023    1:58 PM 10/16/2022    3:37 PM 04/26/2022    1:06 PM 03/06/2022    2:00 PM 04/06/2021    2:21 PM  Fall Risk   Falls in the past year? 0 0 0 0 0  Number falls in past yr: 0 0 0 0 0  Injury with Fall? 0 0 0 0 0  Risk for fall due to : No Fall Risks No Fall Risks No Fall Risks No Fall Risks No Fall Risks  Follow up Falls prevention discussed;Education provided Falls evaluation completed Falls prevention discussed  Falls evaluation completed    MEDICARE RISK AT HOME: Medicare Risk at Home Any stairs in or around the home?: Yes If so, are there any without handrails?: Yes Home free of loose throw rugs in walkways, pet beds, electrical cords, etc?: Yes Adequate lighting in your home to reduce risk of falls?: Yes Life alert?: No Use of a cane, walker or w/c?: Yes Grab bars in the bathroom?: Yes Shower chair or bench in shower?: Yes Elevated toilet seat or a handicapped toilet?: Yes  TIMED UP AND GO:  Was the test performed?  No    Cognitive Function:        04/29/2023    2:15 PM 04/26/2022    1:08 PM 04/06/2021    2:21 PM 09/11/2019   11:01 AM 04/21/2018    2:04 PM  6CIT Screen  What Year? 0 points 0 points 0 points 0 points 0 points  What month? 0 points 0 points 0 points 0 points 0 points  What time? 0 points 0 points 0 points 0 points 0 points  Count back from 20 0 points 0 points 0 points 0 points 0 points  Months in reverse 0 points 0 points 0 points 0 points 0 points  Repeat phrase 0 points 2 points  4 points 2 points 0 points  Total Score 0 points 2 points 4 points 2 points 0 points    Immunizations Immunization History  Administered Date(s) Administered   Fluad Quad(high Dose 65+) 04/06/2019, 06/09/2020   Influenza, High Dose Seasonal PF 06/22/2015, 06/07/2016, 04/11/2017, 04/28/2018   Influenza-Unspecified 06/13/2022   PFIZER(Purple  Top)SARS-COV-2 Vaccination 09/18/2019, 10/07/2019, 07/13/2020   Pneumococcal Conjugate-13 04/11/2017   Pneumococcal Polysaccharide-23 05/18/2013   Td 12/29/2004    TDAP status: Up to date  Flu Vaccine status: Up to date  Pneumococcal vaccine status: Up to date  Covid-19 vaccine status: Completed vaccines  Qualifies for Shingles Vaccine? Yes   Zostavax completed No   Shingrix Completed?: No.    Education has been provided regarding the importance of this vaccine. Patient has been advised to call insurance company to determine out of pocket expense if they have not yet received this vaccine. Advised may also receive vaccine at local pharmacy or Health Dept. Verbalized acceptance and understanding.  Screening Tests Health Maintenance  Topic Date Due   Zoster Vaccines- Shingrix (1 of 2) Never done   DTaP/Tdap/Td (2 - Tdap) 12/30/2014   INFLUENZA VACCINE  03/14/2023   COVID-19 Vaccine (4 - 2023-24 season) 04/14/2023   DEXA SCAN  08/13/2026 (Originally 01/11/2010)   Medicare Annual Wellness (AWV)  04/28/2024   Pneumonia Vaccine 27+ Years old  Completed   Hepatitis C Screening  Completed   HPV VACCINES  Aged Out    Health Maintenance  Health Maintenance Due  Topic Date Due   Zoster Vaccines- Shingrix (1 of 2) Never done   DTaP/Tdap/Td (2 - Tdap) 12/30/2014   INFLUENZA VACCINE  03/14/2023   COVID-19 Vaccine (4 - 2023-24 season) 04/14/2023    Colorectal cancer screening: No longer required.   Mammogram status: No longer required due to age.  Bone Density status: Completed yes. Results reflect: Bone density results: NORMAL. Repeat every 5 years.  Lung Cancer Screening: (Low Dose CT Chest recommended if Age 55-80 years, 20 pack-year currently smoking OR have quit w/in 15years.) does not qualify.   Lung Cancer Screening Referral: no  Additional Screening:  Hepatitis C Screening: does not qualify; Completed yes  Vision Screening: Recommended annual ophthalmology exams for  early detection of glaucoma and other disorders of the eye. Is the patient up to date with their annual eye exam?  Yes  Who is the provider or what is the name of the office in which the patient attends annual eye exams? Dr Dorcas Mcmurray If pt is not established with a provider, would they like to be referred to a provider to establish care? No .   Dental Screening: Recommended annual dental exams for proper oral hygiene  Diabetic Foot Exam: n/a  Community Resource Referral / Chronic Care Management: CRR required this visit?  No   CCM required this visit?  No    Plan:    I have personally reviewed and noted the following in the patient's chart:   Medical and social history Use of alcohol, tobacco or illicit drugs  Current medications and supplements including opioid prescriptions. Patient is not currently taking opioid prescriptions. Functional ability and status Nutritional status Physical activity Advanced directives List of other physicians Hospitalizations, surgeries, and ER visits in previous 12 months Vitals Screenings to include cognitive, depression, and falls Referrals and appointments  In addition, I have reviewed and discussed with patient certain preventive protocols, quality metrics, and best practice recommendations. A written personalized care plan for preventive services as well as  general preventive health recommendations were provided to patient.    Sue Lush, LPN   1/61/0960   After Visit Summary: (MyChart) Due to this being a telephonic visit, the after visit summary with patients personalized plan was offered to patient via MyChart   Nurse Notes: The patient states she is doing well and has no concerns or questions at this time.

## 2023-04-29 NOTE — Patient Instructions (Signed)
Tara Potter , Thank you for taking time to come for your Medicare Wellness Visit. I appreciate your ongoing commitment to your health goals. Please review the following plan we discussed and let me know if I can assist you in the future.   Referrals/Orders/Follow-Ups/Clinician Recommendations: none  This is a list of the screening recommended for you and due dates:  Health Maintenance  Topic Date Due   Zoster (Shingles) Vaccine (1 of 2) Never done   DTaP/Tdap/Td vaccine (2 - Tdap) 12/30/2014   Flu Shot  03/14/2023   COVID-19 Vaccine (4 - 2023-24 season) 04/14/2023   DEXA scan (bone density measurement)  08/13/2026*   Medicare Annual Wellness Visit  04/28/2024   Pneumonia Vaccine  Completed   Hepatitis C Screening  Completed   HPV Vaccine  Aged Out  *Topic was postponed. The date shown is not the original due date.    Advanced directives: (Provided) Advance directive discussed with you today. I have provided a copy for you to complete at home and have notarized. Once this is complete, please bring a copy in to our office so we can scan it into your chart.  Mailed to pt home per request  Next Medicare Annual Wellness Visit scheduled for next year: Yes 04/29/24 @ 1:40pm telephone

## 2023-05-02 ENCOUNTER — Telehealth: Payer: Medicare PPO | Admitting: Physician Assistant

## 2023-05-02 ENCOUNTER — Ambulatory Visit: Payer: Self-pay

## 2023-05-02 ENCOUNTER — Telehealth: Payer: Self-pay | Admitting: Family Medicine

## 2023-05-02 DIAGNOSIS — R3989 Other symptoms and signs involving the genitourinary system: Secondary | ICD-10-CM

## 2023-05-02 MED ORDER — SULFAMETHOXAZOLE-TRIMETHOPRIM 800-160 MG PO TABS
1.0000 | ORAL_TABLET | Freq: Two times a day (BID) | ORAL | 0 refills | Status: AC
Start: 2023-05-02 — End: ?

## 2023-05-02 NOTE — Patient Instructions (Signed)
Tara Potter, thank you for joining Margaretann Loveless, PA-C for today's virtual visit.  While this provider is not your primary care provider (PCP), if your PCP is located in our provider database this encounter information will be shared with them immediately following your visit.   A [facility] MyChart account gives you access to today's visit and all your visits, tests, and labs performed at [facility] " click here if you don't have a [facility] MyChart account or go to [email or web address]  Consent: (Patient) Tara Potter provided verbal consent for this virtual visit at the beginning of the encounter.  Current Medications:  Current Outpatient Medications:    sulfamethoxazole-trimethoprim (BACTRIM DS) 800-160 MG tablet, Take 1 tablet by mouth 2 (two) times daily., Disp: 10 tablet, Rfl: 0   acetaminophen (TYLENOL) 650 MG CR tablet, Take 650 mg by mouth in the morning and at bedtime., Disp: , Rfl:    aspirin EC 81 MG tablet, Take 81 mg by mouth every other day. (Patient not taking: Reported on 10/16/2022), Disp: , Rfl:    atorvastatin (LIPITOR) 10 MG tablet, TAKE 1 TABLET BY MOUTH EVERY DAY, Disp: 90 tablet, Rfl: 3   bisoprolol-hydrochlorothiazide (ZIAC) 5-6.25 MG tablet, TAKE 1 TABLET BY MOUTH EVERY DAY, Disp: 90 tablet, Rfl: 0   clobetasol cream (TEMOVATE) 0.05 %, Apply 1 application. topically 2 (two) times daily., Disp: 30 g, Rfl: 2   [facility]S ACETAMINOPHEN 325 MG CAPS, , Disp: , Rfl:    diclofenac (VOLTAREN) 75 MG EC tablet, TAKE 1 TABLET BY MOUTH TWICE A DAY, Disp: 118 tablet, Rfl: 1   diclofenac Sodium (VOLTAREN) 1 % GEL, Apply topically daily., Disp: , Rfl:    estradiol (ESTRACE VAGINAL) 0.1 MG/GM vaginal cream, Place 1 Applicatorful vaginally 3 (three) times a week., Disp: 42.5 g, Rfl: 12   famotidine (PEPCID) 20 MG tablet, Take 1 tablet by mouth twice daily (Patient taking differently: as needed.), Disp: 180 tablet, Rfl: 0   fluticasone (FLONASE) 50 MCG/ACT nasal  spray, Use 2 spray(s) in each nostril once daily, Disp: 48 g, Rfl: 0   gabapentin (NEURONTIN) 100 MG capsule, TAKE 1 CAPSULE BY MOUTH EVERYDAY AT BEDTIME, Disp: 90 capsule, Rfl: 1   levothyroxine (SYNTHROID) 75 MCG tablet, TAKE 1 TABLET BY MOUTH EVERY DAY, Disp: 90 tablet, Rfl: 1   loratadine (CLARITIN) 10 MG tablet, Take 10 mg by mouth daily as needed for allergies., Disp: , Rfl:    Multiple Vitamins-Minerals (MULTIVITAL PO), Take by mouth daily. When remembers, Disp: , Rfl:    Medications ordered in this encounter:  Meds ordered this encounter  Medications   sulfamethoxazole-trimethoprim (BACTRIM DS) 800-160 MG tablet    Sig: Take 1 tablet by mouth 2 (two) times daily.    Dispense:  10 tablet    Refill:  0    Order Specific Question:   Supervising Provider    Answer:   Merrilee Jansky [id number]     *If you need refills on other medications prior to your next appointment, please contact your pharmacy*  Follow-Up: Call back or seek an in-person evaluation if the symptoms worsen or if the condition fails to improve as anticipated.  [facility] [phone]  Other Instructions Urinary Tract Infection, Adult  A urinary tract infection (UTI) is an infection of any part of the urinary tract. The urinary tract includes the kidneys, ureters, bladder, and urethra. These organs make, store, and get rid of urine in the body. An upper UTI  affects the ureters and kidneys. A lower UTI affects the bladder and urethra. What are the causes? Most urinary tract infections are caused by bacteria in your genital area around your urethra, where urine leaves your body. These bacteria grow and cause inflammation of your urinary tract. What increases the risk? You are more likely to develop this condition if: You have a urinary catheter that stays in place. You are not able to control when you urinate or have a bowel movement (incontinence). You are female and you: Use a spermicide or  diaphragm for birth control. Have low estrogen levels. Are pregnant. You have certain genes that increase your risk. You are sexually active. You take antibiotic medicines. You have a condition that causes your flow of urine to slow down, such as: An enlarged prostate, if you are female. Blockage in your urethra. A kidney stone. A nerve condition that affects your bladder control (neurogenic bladder). Not getting enough to drink, or not urinating often. You have certain medical conditions, such as: Diabetes. A weak disease-fighting system (immunesystem). Sickle cell disease. Gout. Spinal cord injury. What are the signs or symptoms? Symptoms of this condition include: Needing to urinate right away (urgency). Frequent urination. This may include small amounts of urine each time you urinate. Pain or burning with urination. Blood in the urine. Urine that smells bad or unusual. Trouble urinating. Cloudy urine. Vaginal discharge, if you are female. Pain in the abdomen or the lower back. You may also have: Vomiting or a decreased appetite. Confusion. Irritability or tiredness. A fever or chills. Diarrhea. The first symptom in older adults may be confusion. In some cases, they may not have any symptoms until the infection has worsened. How is this diagnosed? This condition is diagnosed based on your medical history and a physical exam. You may also have other tests, including: Urine tests. Blood tests. Tests for STIs (sexually transmitted infections). If you have had more than one UTI, a cystoscopy or imaging studies may be done to determine the cause of the infections. How is this treated? Treatment for this condition includes: Antibiotic medicine. Over-the-counter medicines to treat discomfort. Drinking enough water to stay hydrated. If you have frequent infections or have other conditions such as a kidney stone, you may need to see a health care provider who specializes in the  urinary tract (urologist). In rare cases, urinary tract infections can cause sepsis. Sepsis is a life-threatening condition that occurs when the body responds to an infection. Sepsis is treated in the hospital with IV antibiotics, fluids, and other medicines. Follow these instructions at home:  Medicines Take over-the-counter and prescription medicines only as told by your health care provider. If you were prescribed an antibiotic medicine, take it as told by your health care provider. Do not stop using the antibiotic even if you start to feel better. General instructions Make sure you: Empty your bladder often and completely. Do not hold urine for long periods of time. Empty your bladder after sex. Wipe from front to back after urinating or having a bowel movement if you are female. Use each tissue only one time when you wipe. Drink enough fluid to keep your urine pale yellow. Keep all follow-up visits. This is important. Contact a health care provider if: Your symptoms do not get better after 1-2 days. Your symptoms go away and then return. Get help right away if: You have severe pain in your back or your lower abdomen. You have a fever or chills. You have nausea  or vomiting. Summary A urinary tract infection (UTI) is an infection of any part of the urinary tract, which includes the kidneys, ureters, bladder, and urethra. Most urinary tract infections are caused by bacteria in your genital area. Treatment for this condition often includes antibiotic medicines. If you were prescribed an antibiotic medicine, take it as told by your health care provider. Do not stop using the antibiotic even if you start to feel better. Keep all follow-up visits. This is important. This information is not intended to replace advice given to you by your health care provider. Make sure you discuss any questions you have with your health care provider. Document Revised: 03/06/2020 Document Reviewed:  03/11/2020 Elsevier Patient Education  2024 Elsevier Inc.    If you have been instructed to have an in-person evaluation today at a local Urgent Care facility, please use the link below. It will take you to a list of all of our available [facility], including address, phone number and hours of operation. Please do not delay care.  [facility]  If you or a family member do not have a primary care provider, use the link below to schedule a visit and establish care. When you choose a [facility] primary care physician or advanced practice provider, you gain a long-term partner in health. Find a Primary Care Provider  Learn more about [facility]'s in-office and virtual care options: [facility] - Get Care Now

## 2023-05-02 NOTE — Telephone Encounter (Signed)
Needs to be evaluated. We need to see what is going on and check urine (in sterile container) prior to prescribing antibiotics.

## 2023-05-02 NOTE — Telephone Encounter (Signed)
Patient was advised. She did virtual urgent care.

## 2023-05-02 NOTE — Progress Notes (Signed)
Virtual Visit Consent   Tara Potter, you are scheduled for a virtual visit with a [facility] provider today. Just as with appointments in the office, your consent must be obtained to participate. Your consent will be active for this visit and any virtual visit you may have with one of our providers in the next 365 days. If you have a MyChart account, a copy of this consent can be sent to you electronically.  As this is a virtual visit, video technology does not allow for your provider to perform a traditional examination. This may limit your provider's ability to fully assess your condition. If your provider identifies any concerns that need to be evaluated in person or the need to arrange testing (such as labs, EKG, etc.), we will make arrangements to do so. Although advances in technology are sophisticated, we cannot ensure that it will always work on either your end or our end. If the connection with a video visit is poor, the visit may have to be switched to a telephone visit. With either a video or telephone visit, we are not always able to ensure that we have a secure connection.  By engaging in this virtual visit, you consent to the provision of healthcare and authorize for your insurance to be billed (if applicable) for the services provided during this visit. Depending on your insurance coverage, you may receive a charge related to this service.  I need to obtain your verbal consent now. Are you willing to proceed with your visit today? [name] has provided verbal consent on 05/02/2023 for a virtual visit (video or telephone). Margaretann Loveless, PA-C  Date: 05/02/2023 1:39 PM  Virtual Visit via Video Note   I, Margaretann Loveless, connected with  Tara Potter  ([id number], 11-12-1945) on 05/02/23 at  1:15 PM EDT by a video-enabled telemedicine application and verified that I am speaking with the correct person using two identifiers.  Location: Patient: Virtual Visit Location Patient:  Home Provider: Virtual Visit Location Provider: Home Office   I discussed the limitations of evaluation and management by telemedicine and the availability of in person appointments. The patient expressed understanding and agreed to proceed.    History of Present Illness: Tara Potter is a 77 y.o. who identifies as a female who was assigned female at birth, and is being seen today for possible UTI.  HPI: Urinary Tract Infection  This is a new problem. The current episode started yesterday (Does wear a mini pad for mild urinary incontinence and feels that may be white). The problem occurs every urination. The problem has been gradually worsening. The patient is experiencing no pain. There has been no fever. Associated symptoms include frequency (going once an hour starting last night until about 4am), hesitancy and urgency. Pertinent negatives include no chills, discharge, flank pain, hematuria, nausea or vomiting. Associated symptoms comments: Feels like pressure. She has tried nothing for the symptoms. The treatment provided no relief.     Problems:  Patient Active Problem List   Diagnosis Date Noted   Atrophic vaginitis 04/06/2021   Lumbar degenerative disc disease 02/02/2020   Chronic pain of both knees 02/02/2020   Sacroiliac joint pain 02/02/2020   Bilateral hip pain 02/02/2020   Chronic pain syndrome 02/02/2020   Bilateral primary osteoarthritis of knee 12/10/2019   Frequency of urination 12/10/2019   Sciatica of left side 11/19/2018   Lumbar facet arthropathy 07/15/2017   Bursitis of right shoulder 04/11/2017   Morbid  obesity (HCC) 04/11/2017   Prediabetes 03/14/2016   Arthritis, degenerative 09/29/2008   Essential (primary) hypertension 02/25/2008   Cardiac murmur 01/29/2008   HLD (hyperlipidemia) 08/13/1998   Adult hypothyroidism 08/13/1998   Hay fever 08/13/1998    Allergies:  Allergies  Allergen Reactions   Augmentin [Amoxicillin-Pot Clavulanate] Nausea Only and Other  (See Comments)    hallucinates   Amoxicillin Diarrhea    Augmentin   Entex T [Pseudoephedrine-Guaifenesin]     keeps awake; pt states she still takes this   Erythromycin Diarrhea   Medications:  Current Outpatient Medications:    sulfamethoxazole-trimethoprim (BACTRIM DS) 800-160 MG tablet, Take 1 tablet by mouth 2 (two) times daily., Disp: 10 tablet, Rfl: 0   acetaminophen (TYLENOL) 650 MG CR tablet, Take 650 mg by mouth in the morning and at bedtime., Disp: , Rfl:    aspirin EC 81 MG tablet, Take 81 mg by mouth every other day. (Patient not taking: Reported on 10/16/2022), Disp: , Rfl:    atorvastatin (LIPITOR) 10 MG tablet, TAKE 1 TABLET BY MOUTH EVERY DAY, Disp: 90 tablet, Rfl: 3   bisoprolol-hydrochlorothiazide (ZIAC) 5-6.25 MG tablet, TAKE 1 TABLET BY MOUTH EVERY DAY, Disp: 90 tablet, Rfl: 0   clobetasol cream (TEMOVATE) 0.05 %, Apply 1 application. topically 2 (two) times daily., Disp: 30 g, Rfl: 2   [facility] ACETAMINOPHEN 325 MG CAPS, , Disp: , Rfl:    diclofenac (VOLTAREN) 75 MG EC tablet, TAKE 1 TABLET BY MOUTH TWICE A DAY, Disp: 118 tablet, Rfl: 1   diclofenac Sodium (VOLTAREN) 1 % GEL, Apply topically daily., Disp: , Rfl:    estradiol (ESTRACE VAGINAL) 0.1 MG/GM vaginal cream, Place 1 Applicatorful vaginally 3 (three) times a week., Disp: 42.5 g, Rfl: 12   famotidine (PEPCID) 20 MG tablet, Take 1 tablet by mouth twice daily (Patient taking differently: as needed.), Disp: 180 tablet, Rfl: 0   fluticasone (FLONASE) 50 MCG/ACT nasal spray, Use 2 spray(s) in each nostril once daily, Disp: 48 g, Rfl: 0   gabapentin (NEURONTIN) 100 MG capsule, TAKE 1 CAPSULE BY MOUTH EVERYDAY AT BEDTIME, Disp: 90 capsule, Rfl: 1   levothyroxine (SYNTHROID) 75 MCG tablet, TAKE 1 TABLET BY MOUTH EVERY DAY, Disp: 90 tablet, Rfl: 1   loratadine (CLARITIN) 10 MG tablet, Take 10 mg by mouth daily as needed for allergies., Disp: , Rfl:    Multiple Vitamins-Minerals (MULTIVITAL PO), Take by mouth daily. When  remembers, Disp: , Rfl:   Observations/Objective: Patient is well-developed, well-nourished in no acute distress.  Resting comfortably at home.  Head is normocephalic, atraumatic.  No labored breathing.  Speech is clear and coherent with logical content.  Patient is alert and oriented at baseline.    Assessment and Plan: 1. Suspected UTI - sulfamethoxazole-trimethoprim (BACTRIM DS) 800-160 MG tablet; Take 1 tablet by mouth 2 (two) times daily.  Dispense: 10 tablet; Refill: 0  - Worsening symptoms.  - Will treat empirically with Bactrim - May use AZO for bladder spasms - Continue to push fluids.  - Seek in person evaluation for urine culture if symptoms do not improve or if they worsen.    Follow Up Instructions: I discussed the assessment and treatment plan with the patient. The patient was provided an opportunity to ask questions and all were answered. The patient agreed with the plan and demonstrated an understanding of the instructions.  A copy of instructions were sent to the patient via MyChart unless otherwise noted below.    The patient was advised to  call back or seek an in-person evaluation if the symptoms worsen or if the condition fails to improve as anticipated.  Time:  I spent 10 minutes with the patient via telehealth technology discussing the above problems/concerns.    Margaretann Loveless, PA-C

## 2023-05-02 NOTE — Telephone Encounter (Signed)
  Summary: poss UTI     Pt is having UA frequency, she has been up every hour to 1 1/2 last night.  She said it started about 10 pm last night.  But when she goes it is a normal amount.  (No dribbles). Pt is leaving tomorrow for her 60th year high reunion.  And she doesn't want to miss it.  She is wanting to get Rx called in, or drop off specimen.  She said she just doesn't have time for an appt  Per [facility].Marland Kitchenplease advise

## 2023-05-02 NOTE — Telephone Encounter (Signed)
Please review

## 2023-05-02 NOTE — Telephone Encounter (Signed)
Needs to be seen. Are there any openings today?  She could do virtual urgent care maybe if not.

## 2023-05-02 NOTE — Telephone Encounter (Signed)
Duplicate: Please disregard

## 2023-05-02 NOTE — Telephone Encounter (Signed)
  Chief Complaint: Frequency Symptoms: above Frequency: last night Pertinent Negatives: Patient denies odor, pain, cloudiness, or off color or fever Disposition: [] ED /[] Urgent Care (no appt availability in office) / [] Appointment(In office/virtual)/ []  [facility]/ [] Home Care/ [x] Refused Recommended Disposition /[] [facility]/ []  Follow-up with PCP Additional Notes: Pt states that she thinks she may have a UTI. Last night she had to get up several times overnight to urinate. She has not had frequency today. Pt is going out of town and would like medication prescribed. Pt states that she does not have time for an OV. She is willing to bring in a sample, but it would not be in a sterile container. Pt states she cannot come in and give sample, because it is too much walking. Called FC, Nicole for advice. Joni Reining will send note to provider. I will forward encounter to provider.  Summary: poss UTI   Pt is having UA frequency, she has been up every hour to 1 1/2 last night.  She said it started about 10 pm last night.  But when she goes it is a normal amount.  (No dribbles). Pt is leaving tomorrow for her 60th year high reunion.  And she doesn't want to miss it.  She is wanting to get Rx called in, or drop off specimen.  She said she just doesn't have time for an appt     Reason for Disposition  All other urine symptoms  Answer Assessment - Initial Assessment Questions 1. SYMPTOM: "What's the main symptom you're concerned about?" (e.g., frequency, incontinence)     Frequency and urgency 2. ONSET: "When did the  Urgency, frequency  start?"     10:30 last night and overnight 3. PAIN: "Is there any pain?" If Yes, ask: "How bad is it?" (Scale: 1-10; mild, moderate, severe)     no 4. CAUSE: "What do you think is causing the symptoms?"     UTI 5. OTHER SYMPTOMS: "Do you have any other symptoms?" (e.g., blood in urine, fever, flank pain, pain with urination)     no  Protocols  used: Urinary Symptoms-A-AH

## 2023-05-02 NOTE — Telephone Encounter (Signed)
Patient was instructed on how to do a telehealth visit.

## 2023-05-11 ENCOUNTER — Other Ambulatory Visit: Payer: Self-pay | Admitting: Family Medicine

## 2023-05-13 NOTE — Telephone Encounter (Signed)
Requested medication (s) are due for refill today: yes  Requested medication (s) are on the active medication list: yes  Last refill:  01/14/23  Future visit scheduled: yes  Notes to clinic:  Unable to refill per protocol due to failed labs, no updated results.      Requested Prescriptions  Pending Prescriptions Disp Refills   diclofenac (VOLTAREN) 75 MG EC tablet [Pharmacy Med Name: DICLOFENAC SOD EC 75 MG TAB] 118 tablet 1    Sig: TAKE 1 TABLET BY MOUTH TWICE A DAY     Analgesics:  NSAIDS Failed - 05/11/2023  1:28 AM      Failed - Manual Review: Labs are only required if the patient has taken medication for more than 8 weeks.      Failed - Cr in normal range and within 360 days    Creatinine, Ser  Date Value Ref Range Status  10/16/2022 1.14 (H) 0.57 - 1.00 mg/dL Final         Failed - HGB in normal range and within 360 days    Hemoglobin  Date Value Ref Range Status  03/06/2022 13.0 11.1 - 15.9 g/dL Final         Failed - PLT in normal range and within 360 days    Platelets  Date Value Ref Range Status  03/06/2022 259 150 - 450 x10E3/uL Final         Failed - HCT in normal range and within 360 days    Hematocrit  Date Value Ref Range Status  03/06/2022 38.1 34.0 - 46.6 % Final         Passed - eGFR is 30 or above and within 360 days    GFR calc Af Amer  Date Value Ref Range Status  06/09/2020 50 (L) >59 mL/min/1.73 Final    Comment:    **In accordance with recommendations from the NKF-ASN Task force,**   [facility] is in the process of updating its eGFR calculation to the   2021 CKD-EPI creatinine equation that estimates kidney function   without a race variable.    GFR calc non Af Amer  Date Value Ref Range Status  06/09/2020 43 (L) >59 mL/min/1.73 Final   eGFR  Date Value Ref Range Status  10/16/2022 50 (L) >59 mL/min/1.73 Final         Passed - Patient is not pregnant      Passed - Valid encounter within last 12 months    Recent Outpatient Visits            5 months ago Urinary frequency   [place] [facility] Alfredia Ferguson, PA-C   6 months ago Essential (primary) hypertension   [place] [facility] [place], Marzella Schlein, MD   7 months ago Cystitis   [place] [facility] [place], Marzella Schlein, MD   1 year ago Essential (primary) hypertension    [facility] Alfredia Ferguson, PA-C   1 year ago Acute cystitis without hematuria   [facility] Alfredia Ferguson, PA-C       Future Appointments             In 3 days Bacigalupo, Marzella Schlein, MD [facility], [facility]

## 2023-05-16 ENCOUNTER — Encounter: Payer: Self-pay | Admitting: Family Medicine

## 2023-05-16 ENCOUNTER — Ambulatory Visit (INDEPENDENT_AMBULATORY_CARE_PROVIDER_SITE_OTHER): Payer: Medicare PPO | Admitting: Family Medicine

## 2023-05-16 VITALS — BP 123/63 | HR 63 | Ht 68.5 in | Wt 295.5 lb

## 2023-05-16 DIAGNOSIS — M170 Bilateral primary osteoarthritis of knee: Secondary | ICD-10-CM

## 2023-05-16 DIAGNOSIS — N3281 Overactive bladder: Secondary | ICD-10-CM | POA: Insufficient documentation

## 2023-05-16 DIAGNOSIS — N1831 Chronic kidney disease, stage 3a: Secondary | ICD-10-CM | POA: Diagnosis not present

## 2023-05-16 DIAGNOSIS — E039 Hypothyroidism, unspecified: Secondary | ICD-10-CM

## 2023-05-16 DIAGNOSIS — R7303 Prediabetes: Secondary | ICD-10-CM | POA: Diagnosis not present

## 2023-05-16 DIAGNOSIS — I10 Essential (primary) hypertension: Secondary | ICD-10-CM

## 2023-05-16 DIAGNOSIS — E7800 Pure hypercholesterolemia, unspecified: Secondary | ICD-10-CM | POA: Diagnosis not present

## 2023-05-16 DIAGNOSIS — Z0000 Encounter for general adult medical examination without abnormal findings: Secondary | ICD-10-CM | POA: Diagnosis not present

## 2023-05-16 MED ORDER — DICLOFENAC SODIUM 75 MG PO TBEC: 75.0000 mg | DELAYED_RELEASE_TABLET | Freq: Two times a day (BID) | ORAL | 1 refills | Status: DC

## 2023-05-16 NOTE — Assessment & Plan Note (Signed)
Discussed importance of healthy weight management Discussed diet and exercise  

## 2023-05-16 NOTE — Assessment & Plan Note (Signed)
Well controlled Continue current medications Recheck metabolic panel F/u in 6 months  

## 2023-05-16 NOTE — Assessment & Plan Note (Signed)
Previously well controlled Continue Synthroid at current dose  Recheck TSH and adjust Synthroid as indicated   

## 2023-05-16 NOTE — Assessment & Plan Note (Signed)
Chronic and stable Recheck metabolic panel Avoid nephrotoxic meds - is on NSAIDs - risk/benefits discussed with patient

## 2023-05-16 NOTE — Assessment & Plan Note (Signed)
Recommend low carb diet °Recheck A1c  °

## 2023-05-16 NOTE — Assessment & Plan Note (Signed)
increased nocturia, especially when lying down. No current desire for medication management. -Continue monitoring symptoms and consider medication if symptoms worsen.

## 2023-05-16 NOTE — Progress Notes (Signed)
Complete physical exam  Patient: Tara Potter   DOB: 04/04/1946   77 y.o. Female  MRN: [id number]  Subjective:    Chief Complaint  Patient presents with   Annual Exam    Would like to urin frequency at night    Medication Refill    Diclofenac     Tara Potter is a 77 y.o. female who presents today for a complete physical exam. She reports consuming a general diet.  She generally feels well. She reports sleeping fairly well. She does have additional problems to discuss today.    Discussed the use of AI scribe software for clinical note transcription with the patient, who gave verbal consent to proceed.  History of Present Illness   The patient, with a history of chronic knee pain, reports worsening pain and stiffness in her knees. She describes the pain as if her knees are "crying help, help, help," and notes that the condition has been progressively worsening. She expresses a desire for a new treatment to alleviate the pain. She has previously received steroid and gel shots, the latter being a long time ago. She also underwent a temporary nerve ablation procedure about a year and a half ago, but has been hesitant to proceed with a permanent one due to concerns about leg function.  The patient also mentions that she is taking diclofenac for pain relief, but she is running low on her prescription. She notes that walking does provide some relief, but she has difficulty doing so due to the pain and the risk of tripping over her dogs.  In addition to her knee pain, the patient reports frequent urination at night, which increases when she lies down. She has previously had a urinary tract infection, which was treated with Bactrim, but it took a long time for the medication to take effect. She also notes that certain types of soft drinks exacerbate her urinary symptoms.       Most recent fall risk assessment:    04/29/2023    1:58 PM  Fall Risk   Falls in the past year? 0  Number falls in  past yr: 0  Injury with Fall? 0  Risk for fall due to : No Fall Risks  Follow up Falls prevention discussed;Education provided     Most recent depression screenings:    05/16/2023    3:54 PM 04/29/2023    2:06 PM  PHQ 2/9 Scores  PHQ - 2 Score 2 2  PHQ- 9 Score 4 4        Patient Care Team: Erasmo Downer, MD as PCP - General (Family Medicine) Dingeldein, Viviann Spare, MD as Consulting Physician (Ophthalmology)   Outpatient Medications Prior to Visit  Medication Sig   acetaminophen (TYLENOL) 650 MG CR tablet Take 650 mg by mouth in the morning and at bedtime.   aspirin EC 81 MG tablet Take 81 mg by mouth every other day.   atorvastatin (LIPITOR) 10 MG tablet TAKE 1 TABLET BY MOUTH EVERY DAY   bisoprolol-hydrochlorothiazide (ZIAC) 5-6.25 MG tablet TAKE 1 TABLET BY MOUTH EVERY DAY   clobetasol cream (TEMOVATE) 0.05 % Apply 1 application. topically 2 (two) times daily.   [facility] ACETAMINOPHEN 325 MG CAPS    diclofenac Sodium (VOLTAREN) 1 % GEL Apply topically daily.   estradiol (ESTRACE VAGINAL) 0.1 MG/GM vaginal cream Place 1 Applicatorful vaginally 3 (three) times a week.   famotidine (PEPCID) 20 MG tablet Take 1 tablet by mouth twice daily (Patient taking differently:  as needed.)   fluticasone (FLONASE) 50 MCG/ACT nasal spray Use 2 spray(s) in each nostril once daily   gabapentin (NEURONTIN) 100 MG capsule TAKE 1 CAPSULE BY MOUTH EVERYDAY AT BEDTIME   levothyroxine (SYNTHROID) 75 MCG tablet TAKE 1 TABLET BY MOUTH EVERY DAY   loratadine (CLARITIN) 10 MG tablet Take 10 mg by mouth daily as needed for allergies.   Multiple Vitamins-Minerals (MULTIVITAL PO) Take by mouth daily. When remembers   [DISCONTINUED] diclofenac (VOLTAREN) 75 MG EC tablet TAKE 1 TABLET BY MOUTH TWICE A DAY   [DISCONTINUED] sulfamethoxazole-trimethoprim (BACTRIM DS) 800-160 MG tablet Take 1 tablet by mouth 2 (two) times daily.   No facility-administered medications prior to visit.    ROS     Objective:      BP 123/63 (BP Location: Left Arm, Patient Position: Sitting, Cuff Size: Large)   Pulse 63   Ht 5' 8.5" (1.74 m)   Wt 295 lb 8 oz (134 kg)   SpO2 97%   BMI 44.28 kg/m    Physical Exam Vitals reviewed.  Constitutional:      General: She is not in acute distress.    Appearance: Normal appearance. She is well-developed. She is not diaphoretic.  HENT:     Head: Normocephalic and atraumatic.     Right Ear: Tympanic membrane, ear canal and external ear normal.     Left Ear: Tympanic membrane, ear canal and external ear normal.     Nose: Nose normal.     Mouth/Throat:     Mouth: Mucous membranes are moist.     Pharynx: Oropharynx is clear. No oropharyngeal exudate.  Eyes:     General: No scleral icterus.    Conjunctiva/sclera: Conjunctivae normal.     Pupils: Pupils are equal, round, and reactive to light.  Neck:     Thyroid: No thyromegaly.  Cardiovascular:     Rate and Rhythm: Normal rate and regular rhythm.     Heart sounds: Normal heart sounds. No murmur heard. Pulmonary:     Effort: Pulmonary effort is normal. No respiratory distress.     Breath sounds: Normal breath sounds. No wheezing or rales.  Abdominal:     General: There is no distension.     Palpations: Abdomen is soft.     Tenderness: There is no abdominal tenderness.  Musculoskeletal:        General: No deformity.     Cervical back: Neck supple.     Right lower leg: No edema.     Left lower leg: No edema.  Lymphadenopathy:     Cervical: No cervical adenopathy.  Skin:    General: Skin is warm and dry.     Findings: No rash.  Neurological:     Mental Status: She is alert and oriented to person, place, and time. Mental status is at baseline.     Gait: Gait normal.  Psychiatric:        Mood and Affect: Mood normal.        Behavior: Behavior normal.        Thought Content: Thought content normal.      No results found for any visits on 05/16/23.     Assessment & Plan:    Routine Health  Maintenance and Physical Exam  Immunization History  Administered Date(s) Administered   Fluad Quad(high Dose 65+) 04/06/2019, 06/09/2020   Influenza, High Dose Seasonal PF 06/22/2015, 06/07/2016, 04/11/2017, 04/28/2018   Influenza-Unspecified 06/13/2022   PFIZER(Purple Top)SARS-COV-2 Vaccination 09/18/2019, 10/07/2019, 07/13/2020   Pneumococcal  Conjugate-13 04/11/2017   Pneumococcal Polysaccharide-23 05/18/2013   Td 12/29/2004    Health Maintenance  Topic Date Due   Zoster Vaccines- Shingrix (1 of 2) Never done   DTaP/Tdap/Td (2 - Tdap) 12/30/2014   COVID-19 Vaccine (4 - 2023-24 season) 04/14/2023   INFLUENZA VACCINE  11/11/2023 (Originally 03/14/2023)   DEXA SCAN  08/13/2026 (Originally 01/11/2010)   Medicare Annual Wellness (AWV)  04/28/2024   Pneumonia Vaccine 23+ Years old  Completed   Hepatitis C Screening  Completed   HPV VACCINES  Aged Out    Discussed health benefits of physical activity, and encouraged her to engage in regular exercise appropriate for her age and condition.  Problem List Items Addressed This Visit       Cardiovascular and Mediastinum   Essential (primary) hypertension    Well controlled Continue current medications Recheck metabolic panel F/u in 6 months       Relevant Orders   Comprehensive metabolic panel     Endocrine   Adult hypothyroidism    Previously well controlled Continue Synthroid at current dose  Recheck TSH and adjust Synthroid as indicated        Relevant Orders   TSH     Musculoskeletal and Integument   Arthritis, degenerative   Relevant Medications   diclofenac (VOLTAREN) 75 MG EC tablet   Other Relevant Orders   Ambulatory referral to Pain Clinic   Bilateral primary osteoarthritis of knee   Relevant Medications   diclofenac (VOLTAREN) 75 MG EC tablet   Other Relevant Orders   Ambulatory referral to Pain Clinic     Genitourinary   Stage 3a chronic kidney disease (HCC)    Chronic and stable Recheck metabolic  panel Avoid nephrotoxic meds - is on NSAIDs - risk/benefits discussed with patient      Relevant Orders   Comprehensive metabolic panel   OAB (overactive bladder)    increased nocturia, especially when lying down. No current desire for medication management. -Continue monitoring symptoms and consider medication if symptoms worsen.        Other   HLD (hyperlipidemia)    Previously well controlled Continue statin Repeat FLP and CMP       Relevant Orders   Comprehensive metabolic panel   Lipid panel   Prediabetes    Recommend low carb diet Recheck A1c       Relevant Orders   Hemoglobin A1c   Morbid obesity (HCC)    Discussed importance of healthy weight management Discussed diet and exercise       Other Visit Diagnoses     Encounter for annual physical exam    -  Primary   Relevant Orders   Hemoglobin A1c   TSH   Comprehensive metabolic panel   Lipid panel           Knee Osteoarthritis Worsening stiffness and pain. Discussed options including gel injections and nerve ablation. -Referral to Dr. Cherylann Ratel for potential nerve ablation. -Consider gel injections with orthopedist or sports medicine specialist.  General Health Maintenance -Order labs including A1C, thyroid, kidney and liver function, and cholesterol. -Recommend tetanus shot at pharmacy. -Schedule six-month follow-up appointment.       Return in about 6 months (around 11/14/2023) for chronic disease f/u.     Shirlee Latch, MD

## 2023-05-16 NOTE — Assessment & Plan Note (Signed)
Previously well controlled Continue statin Repeat FLP and CMP  

## 2023-05-17 ENCOUNTER — Other Ambulatory Visit: Payer: Self-pay

## 2023-05-17 LAB — LIPID PANEL
Chol/HDL Ratio: 4.6 {ratio} — ABNORMAL HIGH (ref 0.0–4.4)
Cholesterol, Total: 188 mg/dL (ref 100–199)
HDL: 41 mg/dL (ref >39)
LDL Chol Calc (NIH): 108 mg/dL — ABNORMAL HIGH (ref 0–99)
Triglycerides: 223 mg/dL — ABNORMAL HIGH (ref 0–149)
VLDL Cholesterol Cal: 39 mg/dL (ref 5–40)

## 2023-05-17 LAB — COMPREHENSIVE METABOLIC PANEL
ALT: 13 [IU]/L (ref 0–32)
AST: 17 [IU]/L (ref 0–40)
Albumin: 4.3 g/dL (ref 3.8–4.8)
Alkaline Phosphatase: 145 [IU]/L — ABNORMAL HIGH (ref 44–121)
BUN/Creatinine Ratio: 26 (ref 12–28)
BUN: 28 mg/dL — ABNORMAL HIGH (ref 8–27)
Bilirubin Total: 0.3 mg/dL (ref 0.0–1.2)
CO2: 26 mmol/L (ref 20–29)
Calcium: 10.6 mg/dL — ABNORMAL HIGH (ref 8.7–10.3)
Chloride: 102 mmol/L (ref 96–106)
Creatinine, Ser: 1.07 mg/dL — ABNORMAL HIGH (ref 0.57–1.00)
Globulin, Total: 2.6 g/dL (ref 1.5–4.5)
Glucose: 90 mg/dL (ref 70–99)
Potassium: 4.9 mmol/L (ref 3.5–5.2)
Sodium: 141 mmol/L (ref 134–144)
Total Protein: 6.9 g/dL (ref 6.0–8.5)
eGFR: 53 mL/min/{1.73_m2} — ABNORMAL LOW (ref >59)

## 2023-05-17 LAB — HEMOGLOBIN A1C
Est. average glucose Bld gHb Est-mCnc: 114 mg/dL
Hgb A1c MFr Bld: 5.6 % (ref 4.8–5.6)

## 2023-05-17 LAB — TSH: TSH: 3.01 u[IU]/mL (ref 0.450–4.500)

## 2023-05-19 ENCOUNTER — Other Ambulatory Visit: Payer: Self-pay | Admitting: Family Medicine

## 2023-05-20 NOTE — Telephone Encounter (Signed)
Requested Prescriptions  Pending Prescriptions Disp Refills   atorvastatin (LIPITOR) 10 MG tablet [Pharmacy Med Name: ATORVASTATIN 10 MG TABLET] 90 tablet 3    Sig: TAKE 1 TABLET BY MOUTH EVERY DAY     Cardiovascular:  Antilipid - Statins Failed - 05/19/2023  4:46 AM      Failed - Lipid Panel in normal range within the last 12 months    Cholesterol, Total  Date Value Ref Range Status  05/16/2023 188 100 - 199 mg/dL Final   LDL Chol Calc (NIH)  Date Value Ref Range Status  05/16/2023 108 (H) 0 - 99 mg/dL Final   HDL  Date Value Ref Range Status  05/16/2023 41 >39 mg/dL Final   Triglycerides  Date Value Ref Range Status  05/16/2023 223 (H) 0 - 149 mg/dL Final         Passed - Patient is not pregnant      Passed - Valid encounter within last 12 months    Recent Outpatient Visits           4 days ago Encounter for annual physical exam   [facility] [place], Marzella Schlein, MD   5 months ago Urinary frequency   [place] [facility] Alfredia Ferguson, PA-C   7 months ago Essential (primary) hypertension   [place] [facility] [place], Marzella Schlein, MD   7 months ago Cystitis   [facility] [place], Marzella Schlein, MD   1 year ago Essential (primary) hypertension   [place] [facility] Alfredia Ferguson, PA-C       Future Appointments             In 6 months Bacigalupo, Marzella Schlein, MD [facility], [facility]

## 2023-06-18 ENCOUNTER — Other Ambulatory Visit: Payer: Self-pay | Admitting: Family Medicine

## 2023-06-18 DIAGNOSIS — I10 Essential (primary) hypertension: Secondary | ICD-10-CM

## 2023-06-25 ENCOUNTER — Encounter: Payer: Self-pay | Admitting: Student in an Organized Health Care Education/Training Program

## 2023-06-25 ENCOUNTER — Ambulatory Visit
Payer: Medicare PPO | Attending: Student in an Organized Health Care Education/Training Program | Admitting: Student in an Organized Health Care Education/Training Program

## 2023-06-25 VITALS — BP 123/72 | HR 59 | Temp 97.4°F | Resp 17 | Ht 69.0 in | Wt 310.0 lb

## 2023-06-25 DIAGNOSIS — M5136 Other intervertebral disc degeneration, lumbar region with discogenic back pain only: Secondary | ICD-10-CM | POA: Insufficient documentation

## 2023-06-25 DIAGNOSIS — M170 Bilateral primary osteoarthritis of knee: Secondary | ICD-10-CM | POA: Diagnosis not present

## 2023-06-25 DIAGNOSIS — G894 Chronic pain syndrome: Secondary | ICD-10-CM | POA: Insufficient documentation

## 2023-06-25 DIAGNOSIS — M47816 Spondylosis without myelopathy or radiculopathy, lumbar region: Secondary | ICD-10-CM | POA: Insufficient documentation

## 2023-06-25 NOTE — Progress Notes (Signed)
Safety precautions to be maintained throughout the outpatient stay will include: orient to surroundings, keep bed in low position, maintain call bell within reach at all times, provide assistance with transfer out of bed and ambulation.  

## 2023-06-25 NOTE — Progress Notes (Signed)
Patient: Tara Potter  Service Category: E/M  Provider: Edward Jolly, MD  DOB: 06-26-1946  DOS: 06/25/2023  Referring Provider: Erasmo Downer, MD  MRN: [id number]  Setting: Ambulatory outpatient  PCP: Erasmo Downer, MD  Type: New Patient  Specialty: Interventional Pain Management    Location: Office  Delivery: Face-to-face     Primary Reason(s) for Visit: Encounter for initial evaluation of one or more chronic problems (new to examiner) potentially causing chronic pain, and posing a threat to normal musculoskeletal function. (Level of risk: High) CC: Knee Pain (bilat) and Back Pain  HPI  Tara Potter is a 77 y.o. year old, female patient, who comes for the first time to our practice referred by Erasmo Downer, MD for our initial evaluation of her chronic pain. She has HLD (hyperlipidemia); Adult hypothyroidism; Hay fever; Essential (primary) hypertension; Arthritis, degenerative; Cardiac murmur; Prediabetes; Bursitis of right shoulder; Morbid obesity (HCC); Lumbar spondylosis; Sciatica of left side; Bilateral primary osteoarthritis of knee; Frequency of urination; Lumbar degenerative disc disease; Chronic pain of both knees; Sacroiliac joint pain; Bilateral hip pain; Chronic pain syndrome; Atrophic vaginitis; Stage 3a chronic kidney disease (HCC); and OAB (overactive bladder) on their problem list. Today she comes in for evaluation of her Knee Pain (bilat) and Back Pain  Pain Assessment: Location: Right, Left Other (Comment) (lower back) Radiating: denies (Back pain radiates to mid back) Onset:   Duration: Chronic pain Quality: Other (Comment) (feels like "bone on bone"; left knee feels like it "pops out of socket" when climbing steps) Severity: 5 /10 (subjective, self-reported pain score)  Effect on ADL: limits ability to walk Timing:   Modifying factors: sitting, Tylenol, diclofenac gel, gabapentin BP: 123/72  HR: (!) 59  Onset and Duration: Gradual and Present longer than 3  months Cause of pain: Unknown Severity: Getting worse, NAS-11 at its worse: 9/10, NAS-11 at its best: 2/10, and NAS-11 on the average: 6/10 Timing: Not influenced by the time of the day, During activity or exercise, After activity or exercise, and After a period of immobility Aggravating Factors: Climbing, Motion, Prolonged standing, Stooping , Walking, Walking uphill, Walking downhill, and Working Alleviating Factors: Lying down, Medications, Nerve blocks, Resting, Sitting, Standing, TENS, and Warm showers or baths Associated Problems: Fatigue, Weakness, and Pain that wakes patient up Quality of Pain: Aching, Deep, Disabling, Dull, Exhausting, Horrible, Nagging, Punishing, Sharp, and Toothache-like Previous Examinations or Tests: X-rays, Nerve conduction test, and Chiropractic evaluation Previous Treatments: Chiropractic manipulations, Epidural steroid injections, Physical Therapy, Steroid treatments by mouth, TENS, and Trigger point injections  Tara Potter is being evaluated for possible interventional pain management therapies for the treatment of her chronic pain.   Tara Potter is a pleasant 77 year old female who presents with a chief complaint of low back pain and bilateral knee pain, left greater than right.  She also has a history of morbid obesity.  She is being referred from her primary care provider, Dr.Bacigalupo for persistent bilateral knee pain related to knee osteoarthritis.  Patient has tried intra-articular steroid injections as well as viscosupplementation in both knees with orthopedics over 10 years ago.  Of note the patient was also seen by me in 2021 and she received 2 series of bilateral genicular nerve blocks that did help out with her knee pain.  She does find it difficult to bear weight.  She would like to start with a gel injection with her knees.  These are not as helpful, she states that she would like to consider repeating genicular  nerve block and also proceeding with genicular  RFA.  She also endorses axial low back pain.  She has severe lumbar degenerative disc disease and severe lumbar facet arthropathy.  When she previously saw me in 2021, we did discuss lumbar facet medial branch nerve blocks and she is ready to move forward with those at this time.   She ambulates with a cane.  She utilizes Voltaren gel as well as p.o. Voltaren.  She wants to avoid any medications that could increase her risk of sedation or drowsiness.  Patient was a middle Engineer, site.  Unfortunately she lost her husband last year.  She lives by herself.  She performs all ADLs by herself.   Meds   Current Outpatient Medications:    acetaminophen (TYLENOL) 650 MG CR tablet, Take 650 mg by mouth in the morning and at bedtime., Disp: , Rfl:    atorvastatin (LIPITOR) 10 MG tablet, TAKE 1 TABLET BY MOUTH EVERY DAY, Disp: 90 tablet, Rfl: 3   bisoprolol-hydrochlorothiazide (ZIAC) 5-6.25 MG tablet, TAKE 1 TABLET BY MOUTH EVERY DAY, Disp: 90 tablet, Rfl: 0   clobetasol cream (TEMOVATE) 0.05 %, Apply 1 application. topically 2 (two) times daily., Disp: 30 g, Rfl: 2   [employer] ACETAMINOPHEN 325 MG CAPS, , Disp: , Rfl:    diclofenac (VOLTAREN) 75 MG EC tablet, Take 1 tablet (75 mg total) by mouth 2 (two) times daily., Disp: 180 tablet, Rfl: 1   diclofenac Sodium (VOLTAREN) 1 % GEL, Apply topically daily., Disp: , Rfl:    estradiol (ESTRACE VAGINAL) 0.1 MG/GM vaginal cream, Place 1 Applicatorful vaginally 3 (three) times a week., Disp: 42.5 g, Rfl: 12   famotidine (PEPCID) 20 MG tablet, Take 1 tablet by mouth twice daily (Patient taking differently: as needed.), Disp: 180 tablet, Rfl: 0   fluticasone (FLONASE) 50 MCG/ACT nasal spray, Use 2 spray(s) in each nostril once daily, Disp: 48 g, Rfl: 0   gabapentin (NEURONTIN) 100 MG capsule, TAKE 1 CAPSULE BY MOUTH EVERYDAY AT BEDTIME, Disp: 90 capsule, Rfl: 1   levothyroxine (SYNTHROID) 75 MCG tablet, TAKE 1 TABLET BY MOUTH EVERY DAY, Disp: 90 tablet, Rfl: 1    loratadine (CLARITIN) 10 MG tablet, Take 10 mg by mouth daily as needed for allergies., Disp: , Rfl:    Multiple Vitamins-Minerals (MULTIVITAL PO), Take by mouth daily. When remembers, Disp: , Rfl:    aspirin EC 81 MG tablet, Take 81 mg by mouth every other day. (Patient not taking: Reported on 06/25/2023), Disp: , Rfl:   Imaging Review  DG Shoulder Right  Narrative CLINICAL DATA:  Approximate three-month history of right shoulder pain which the patient states happened after she lifted her grandson. No discrete injury.  EXAM: RIGHT SHOULDER - 2+ VIEW  COMPARISON:  None.  FINDINGS: No evidence of acute fracture or glenohumeral dislocation. Glenohumeral joint space well-preserved. Subacromial space well-preserved. Minimal calcification at the insertion of the supraspinatus tendon on the greater tuberosity of the humeral head. Acromioclavicular joint intact. Well preserved bone mineral density. No intrinsic osseous abnormality.  IMPRESSION: Minimal chronic calcific supraspinatus tendinitis. Otherwise normal examination.   Electronically Signed By: Hulan Saas M.D. On: 02/28/2017 19:34  S DG Lumbar Spine Complete W/Bend  Narrative CLINICAL DATA:  Low back pain.  EXAM: LUMBAR SPINE - COMPLETE WITH BENDING VIEWS  COMPARISON:  01/01/2012.  FINDINGS: Lumbar spine numbered the lowest segmented appearing lumbar shaped vertebrae on lateral view as L5. Diffuse severe multilevel degenerative change. 2 mm anterolisthesis L4 on L5. No acute  bony abnormality identified. No evidence of fracture. No evidence of flexion or extension abnormality. Pelvic calcifications consistent phleboliths. Calcified pelvic fibroid may be present.  IMPRESSION: Diffuse severe multilevel degenerative change. Progressed from prior study. 2 mm anterolisthesis L4 on L5. No flexion or extension abnormality. No acute abnormality identified.   Electronically Signed By: Maisie Fus  Register On:  02/04/2020 04:55   Narrative CLINICAL DATA:  Right hip pain.  Arthralgia.  EXAM: BILATERAL SACROILIAC JOINTS - 3+ VIEW  COMPARISON:  Lumbar spine 01/11/2012.  FINDINGS: Degenerative change lumbar spine, both SI joints, both hips. No evidence of inflammatory arthropathy. No acute bony abnormality identified. Pelvic calcifications consistent phleboliths. Prominent pelvic calcified density most likely calcified fibroid.  IMPRESSION: Degenerative changes lumbar spine, both SI joints, both hips. No evidence of inflammatory arthropathy. No acute abnormality identified.   Electronically Signed By: Maisie Fus  Register On: 02/04/2020 04:58   Narrative CLINICAL DATA:  77 year old female with lower back pain and bilateral hip pain.  EXAM: DG HIP (WITH OR WITHOUT PELVIS) 2-3V LEFT; DG HIP (WITH OR WITHOUT PELVIS) 2-3V RIGHT  COMPARISON:  None.  FINDINGS: There is no acute fracture or dislocation. Mild osteopenia and mild bilateral hip osteoarthritic changes. The soft tissues are unremarkable. 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  IMPRESSION: 1. No acute fracture or dislocation. 2. Mild bilateral hip osteoarthritic changes.   Electronically Signed By: Elgie Collard M.D. On: 02/03/2020 23:32  Hip-L DG 2-3 views: Results for orders placed during the hospital encounter of 02/02/20  DG HIP UNILAT W OR W/O PELVIS 2-3 VIEWS LEFT  Narrative CLINICAL DATA:  77 year old female with lower back pain and bilateral hip pain.  EXAM: DG HIP (WITH OR WITHOUT PELVIS) 2-3V LEFT; DG HIP (WITH OR WITHOUT PELVIS) 2-3V RIGHT  COMPARISON:  None.  FINDINGS: There is no acute fracture or dislocation. Mild osteopenia and mild bilateral hip osteoarthritic changes. The soft tissues are unremarkable. 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  IMPRESSION: 1. No acute fracture or dislocation. 2. Mild bilateral hip osteoarthritic changes.   Electronically  Signed By: Elgie Collard M.D. On: 02/03/2020 23:32  CLINICAL DATA:  Bilateral knee pain for the past 45 years. Symptoms greatest on the right.   EXAM: BILATERAL KNEES STANDING - 1 VIEW   COMPARISON:  None in PACs   FINDINGS: AP standing views of both knees are reviewed.   Right knee: There is moderate to severe joint space loss of the medial compartment. There is slight subluxation of the tibia laterally with respect to the femoral condyles. Spurs arise from the articular margins of the tibial plateaus, the lateral femoral condyle, and from the tibial spines.   Left knee: There is moderate joint space loss of the medial compartment. Small spurs arise from the margins of the tibial plateaus and from the articular margin of the lateral femoral condyle. There is beaking of the tibial spines.   IMPRESSION: Osteoarthritic changes of both knees greatest on the right where there is moderate to severe joint space loss medially. On the left the degree of joint space loss medially is only moderate.     Complexity Note: Imaging results reviewed.                         ROS  Cardiovascular: High blood pressure Pulmonary or Respiratory: Shortness of breath, Snoring , and Temporary stoppage of breathing during sleep Neurological: Incontinence:  Urinary Psychological-Psychiatric: Anxiousness and Depressed Gastrointestinal: Irregular, infrequent bowel movements (Constipation)  Genitourinary: Passing kidney stones and Recurrent Urinary Tract infections Hematological: No reported hematological signs or symptoms such as prolonged bleeding, low or poor functioning platelets, bruising or bleeding easily, hereditary bleeding problems, low energy levels due to low hemoglobin or being anemic Endocrine: Slow thyroid Rheumatologic: Joint aches and or swelling due to excess weight (Osteoarthritis) Musculoskeletal: Negative for myasthenia gravis, muscular dystrophy, multiple sclerosis or  malignant hyperthermia Work History: Quit going to work on his/her own  Allergies  Ms. Stehr is allergic to augmentin [amoxicillin-pot clavulanate], amoxicillin, entex t [pseudoephedrine-guaifenesin], and erythromycin.  Laboratory Chemistry Profile   Renal Lab Results  Component Value Date   BUN 28 (H) 05/16/2023   CREATININE 1.07 (H) 05/16/2023   BCR 26 05/16/2023   GFRAA 50 (L) 06/09/2020   GFRNONAA 43 (L) 06/09/2020   SPECGRAV 1.015 02/28/2021   PHUR 5.5 02/28/2021   PROTEINUR Negative 02/28/2021     Electrolytes Lab Results  Component Value Date   NA 141 05/16/2023   K 4.9 05/16/2023   CL 102 05/16/2023   CALCIUM 10.6 (H) 05/16/2023     Hepatic Lab Results  Component Value Date   AST 17 05/16/2023   ALT 13 05/16/2023   ALBUMIN 4.3 05/16/2023   ALKPHOS 145 (H) 05/16/2023     ID No results found for: "LYMEIGGIGMAB", "HIV", "SARSCOV2NAA", "STAPHAUREUS", "MRSAPCR", "HCVAB", "PREGTESTUR", "RMSFIGG", "QFVRPH1IGG", "QFVRPH2IGG"   Bone No results found for: "VD25OH", "VD125OH2TOT", "ZO1096EA5", "WU9811BJ4", "25OHVITD1", "25OHVITD2", "25OHVITD3", "TESTOFREE", "TESTOSTERONE"   Endocrine Lab Results  Component Value Date   GLUCOSE 90 05/16/2023   GLUCOSEU Trace (A) 02/28/2021   HGBA1C 5.6 05/16/2023   TSH 3.010 05/16/2023   FREET4 1.49 03/06/2022     Neuropathy Lab Results  Component Value Date   HGBA1C 5.6 05/16/2023     CNS No results found for: "COLORCSF", "APPEARCSF", "RBCCOUNTCSF", "WBCCSF", "POLYSCSF", "LYMPHSCSF", "EOSCSF", "PROTEINCSF", "GLUCCSF", "JCVIRUS", "CSFOLI", "IGGCSF", "LABACHR", "ACETBL"   Inflammation (CRP: Acute  ESR: Chronic) No results found for: "CRP", "ESRSEDRATE", "LATICACIDVEN"   Rheumatology No results found for: "RF", "ANA", "LABURIC", "URICUR", "LYMEIGGIGMAB", "LYMEABIGMQN", "HLAB27"   Coagulation Lab Results  Component Value Date   PLT 259 03/06/2022     Cardiovascular Lab Results  Component Value Date   HGB 13.0  03/06/2022   HCT 38.1 03/06/2022     Screening No results found for: "SARSCOV2NAA", "COVIDSOURCE", "STAPHAUREUS", "MRSAPCR", "HCVAB", "HIV", "PREGTESTUR"   Cancer No results found for: "CEA", "CA125", "LABCA2"   Allergens No results found for: "ALMOND", "APPLE", "ASPARAGUS", "AVOCADO", "BANANA", "BARLEY", "BASIL", "BAYLEAF", "GREENBEAN", "LIMABEAN", "WHITEBEAN", "BEEFIGE", "REDBEET", "BLUEBERRY", "BROCCOLI", "CABBAGE", "MELON", "CARROT", "CASEIN", "CASHEWNUT", "CAULIFLOWER", "CELERY"     Note: Lab results reviewed.  PFSH  Drug: Ms. Plack  reports no history of drug use. Alcohol:  reports that she does not currently use alcohol. Tobacco:  reports that she has never smoked. She has never used smokeless tobacco. Medical:  has a past medical history of Arthritis, Hyperlipidemia, and Hypertension. Family: family history includes Arthritis in her sister; Atrial fibrillation in her mother; Heart disease in her father; Hypertension in her mother; Kidney disease in her mother; Macular degeneration in her mother.  Past Surgical History:  Procedure Laterality Date   DILATION AND CURETTAGE OF UTERUS     TUBAL LIGATION     Active Ambulatory Problems    Diagnosis Date Noted   HLD (hyperlipidemia) 08/13/1998   Adult hypothyroidism 08/13/1998   Hay fever 08/13/1998   Essential (primary) hypertension 02/25/2008   Arthritis, degenerative 09/29/2008   Cardiac murmur 01/29/2008  Prediabetes 03/14/2016   Bursitis of right shoulder 04/11/2017   Morbid obesity (HCC) 04/11/2017   Lumbar spondylosis 07/15/2017   Sciatica of left side 11/19/2018   Bilateral primary osteoarthritis of knee 12/10/2019   Frequency of urination 12/10/2019   Lumbar degenerative disc disease 02/02/2020   Chronic pain of both knees 02/02/2020   Sacroiliac joint pain 02/02/2020   Bilateral hip pain 02/02/2020   Chronic pain syndrome 02/02/2020   Atrophic vaginitis 04/06/2021   Stage 3a chronic kidney disease (HCC)  05/16/2023   OAB (overactive bladder) 05/16/2023   Resolved Ambulatory Problems    Diagnosis Date Noted   Healthcare maintenance 04/11/2017   Dysfunction of right eustachian tube 10/14/2017   Urinary urgency 10/14/2017   Past Medical History:  Diagnosis Date   Arthritis    Hyperlipidemia    Hypertension    Constitutional Exam  General appearance: Well nourished, well developed, and well hydrated. In no apparent acute distress Vitals:   06/25/23 1300  BP: 123/72  Pulse: (!) 59  Resp: 17  Temp: (!) 97.4 F (36.3 C)  TempSrc: Temporal  SpO2: 97%  Weight: (!) 310 lb (140.6 kg)  Height: 5\' 9"  (1.753 m)   BMI Assessment: Estimated body mass index is 45.78 kg/m as calculated from the following:   Height as of this encounter: 5\' 9"  (1.753 m).   Weight as of this encounter: 310 lb (140.6 kg).  BMI interpretation table: BMI level Category Range association with higher incidence of chronic pain  <18 kg/m2 Underweight   18.5-24.9 kg/m2 Ideal body weight   25-29.9 kg/m2 Overweight Increased incidence by 20%  30-34.9 kg/m2 Obese (Class I) Increased incidence by 68%  35-39.9 kg/m2 Severe obesity (Class II) Increased incidence by 136%  >40 kg/m2 Extreme obesity (Class III) Increased incidence by 254%   Patient's current BMI Ideal Body weight  Body mass index is 45.78 kg/m. Ideal body weight: 66.2 kg (145 lb 15.1 oz) Adjusted ideal body weight: 96 kg (211 lb 9.1 oz)   BMI Readings from Last 4 Encounters:  06/25/23 45.78 kg/m  05/16/23 44.28 kg/m  04/29/23 43.75 kg/m  10/16/22 43.25 kg/m   Wt Readings from Last 4 Encounters:  06/25/23 (!) 310 lb (140.6 kg)  05/16/23 295 lb 8 oz (134 kg)  04/29/23 292 lb (132.5 kg)  10/16/22 292 lb 14.4 oz (132.9 kg)    Psych/Mental status: Alert, oriented x 3 (person, place, & time)       Eyes: PERLA Respiratory: No evidence of acute respiratory distress  Lumbar Exam  Skin & Axial Inspection: No masses, redness, or  swelling Alignment: Symmetrical Functional ROM: Pain restricted ROM affecting both sides Stability: No instability detected Muscle Tone/Strength: Functionally intact. No obvious neuro-muscular anomalies detected. Sensory (Neurological): Musculoskeletal pain pattern Palpation: Complains of area being tender to palpation       Provocative Tests: Hyperextension/rotation test: (+) bilaterally for facet joint pain. Lumbar quadrant test (Kemp's test): deferred today       Lateral bending test: deferred today       Patrick's Maneuver: deferred today                   FABER* test: deferred today                   S-I anterior distraction/compression test: deferred today         S-I lateral compression test: deferred today         S-I Thigh-thrust test: deferred today  S-I Gaenslen's test: deferred today         *(Flexion, ABduction and External Rotation)   Gait & Posture Assessment  Ambulation: Patient ambulates using a cane Gait: Age-related, senile gait pattern Posture: Difficulty standing up straight, due to pain    Lower Extremity Exam      Side: Right lower extremity   Side: Left lower extremity  Stability: No instability observed           Stability: No instability observed          Skin & Extremity Inspection: Skin color, temperature, and hair growth are WNL. No peripheral edema or cyanosis. No masses, redness, swelling, asymmetry, or associated skin lesions. No contractures.   Skin & Extremity Inspection: Skin color, temperature, and hair growth are WNL. No peripheral edema or cyanosis. No masses, redness, swelling, asymmetry, or associated skin lesions. No contractures.  Functional ROM: Pain restricted ROM for hip and knee joints           Functional ROM: Pain restricted ROM for hip and knee joints          Muscle Tone/Strength: Functionally intact. No obvious neuro-muscular anomalies detected.   Muscle Tone/Strength: Functionally intact. No obvious neuro-muscular anomalies  detected.  Sensory (Neurological): Arthropathic arthralgia         Sensory (Neurological): Arthropathic arthralgia        DTR: Patellar: deferred today Achilles: deferred today Plantar: deferred today   DTR: Patellar: deferred today Achilles: deferred today Plantar: deferred today  Palpation: No palpable anomalies   Palpation: No palpable anomalies    Assessment  Primary Diagnosis & Pertinent Problem List: The primary encounter diagnosis was Bilateral primary osteoarthritis of knee. Diagnoses of Lumbar facet arthropathy, Lumbar spondylosis, Degeneration of intervertebral disc of lumbar region with discogenic back pain, and Chronic pain syndrome were also pertinent to this visit.  Visit Diagnosis (New problems to examiner): 1. Bilateral primary osteoarthritis of knee   2. Lumbar facet arthropathy   3. Lumbar spondylosis   4. Degeneration of intervertebral disc of lumbar region with discogenic back pain   5. Chronic pain syndrome    Plan of Care (Initial workup plan)   1. Bilateral primary osteoarthritis of knee -Severe tricompartmental osteoarthritis of bilateral knees.  Has found benefit with intra-articular knee gel injections and genicular nerve block.  Would like to repeat intra-articular gel.  Future considerations include repeating genicular nerve block and trying genicular nerve RFA. - KNEE INJECTION; Future  2. Lumbar facet arthropathy Kylynn Onufer Heinkel has a history of greater than 3 months of moderate to severe pain which is resulted in functional impairment.  The patient has tried various conservative therapeutic options such as NSAIDs, Tylenol, muscle relaxants, physical therapy which was inadequately effective.  Patient's pain is predominantly axial with physical exam and xray findings suggestive of facet arthropathy. Lumbar facet medial branch nerve blocks were discussed with the patient.  Risks and benefits were reviewed.  Patient would like to proceed with bilateral L3, L4, L5  medial branch nerve block. - LUMBAR FACET(MEDIAL BRANCH NERVE BLOCK) MBNB; Future  3. Lumbar spondylosis -as above - LUMBAR FACET(MEDIAL BRANCH NERVE BLOCK) MBNB; Future  4. Degeneration of intervertebral disc of lumbar region with discogenic back pain -chronic, CTM  5. Chronic pain syndrome - KNEE INJECTION; Future - LUMBAR FACET(MEDIAL BRANCH NERVE BLOCK) MBNB; Future   Procedure Orders         KNEE INJECTION         LUMBAR FACET(MEDIAL BRANCH NERVE BLOCK)  MBNB    Provider-requested follow-up: Return in about 13 days (around 07/08/2023) for B/L L3, 4, 5 MBNB + B/L Hyalgan , in clinic [facility].  Future Appointments  Date Time Provider Department Center  11/26/2023  2:20 PM Erasmo Downer, MD [facility]  04/29/2024  1:40 PM [facility]AL WELLNESS VISIT [facility]    Duration of encounter: .  Total time on encounter, as per AMA guidelines included both the face-to-face and non-face-to-face time personally spent by the physician and/or other qualified health care professional(s) on the day of the encounter (includes time in activities that require the physician or other qualified health care professional and does not include time in activities normally performed by clinical staff). Physician's time may include the following activities when performed: Preparing to see the patient (e.g., pre-charting review of records, searching for previously ordered imaging, lab work, and nerve conduction tests) Review of prior analgesic pharmacotherapies. Reviewing PMP Interpreting ordered tests (e.g., lab work, imaging, nerve conduction tests) Performing post-procedure evaluations, including interpretation of diagnostic procedures Obtaining and/or reviewing separately obtained history Performing a medically appropriate examination and/or evaluation Counseling and educating the patient/family/caregiver Ordering medications, tests, or procedures Referring and communicating with other health  care professionals (when not separately reported) Documenting clinical information in the electronic or other health record Independently interpreting results (not separately reported) and communicating results to the patient/ family/caregiver Care coordination (not separately reported)  Note by: Edward Jolly, MD (TTS technology used. I apologize for any typographical errors that were not detected and corrected.) Date: 06/25/2023; Time: 1:47 PM

## 2023-06-25 NOTE — Patient Instructions (Addendum)
GENERAL RISKS AND COMPLICATIONS  What are the risk, side effects and possible complications? Generally speaking, most procedures are safe.  However, with any procedure there are risks, side effects, and the possibility of complications.  The risks and complications are dependent upon the sites that are lesioned, or the type of nerve block to be performed.  The closer the procedure is to the spine, the more serious the risks are.  Great care is taken when placing the radio frequency needles, block needles or lesioning probes, but sometimes complications can occur. Infection: Any time there is an injection through the skin, there is a risk of infection.  This is why sterile conditions are used for these blocks.  There are four possible types of infection. Localized skin infection. Central Nervous System Infection-This can be in the form of Meningitis, which can be deadly. Epidural Infections-This can be in the form of an epidural abscess, which can cause pressure inside of the spine, causing compression of the spinal cord with subsequent paralysis. This would require an emergency surgery to decompress, and there are no guarantees that the patient would recover from the paralysis. Discitis-This is an infection of the intervertebral discs.  It occurs in about 1% of discography procedures.  It is difficult to treat and it may lead to surgery.        2. Pain: the needles have to go through skin and soft tissues, will cause soreness.       3. Damage to internal structures:  The nerves to be lesioned may be near blood vessels or    other nerves which can be potentially damaged.       4. Bleeding: Bleeding is more common if the patient is taking blood thinners such as  aspirin, Coumadin, Ticiid, Plavix, etc., or if he/she have some genetic predisposition  such as hemophilia. Bleeding into the spinal canal can cause compression of the spinal  cord with subsequent paralysis.  This would require an emergency  surgery to  decompress and there are no guarantees that the patient would recover from the  paralysis.       5. Pneumothorax:  Puncturing of a lung is a possibility, every time a needle is introduced in  the area of the chest or upper back.  Pneumothorax refers to free air around the  collapsed lung(s), inside of the thoracic cavity (chest cavity).  Another two possible  complications related to a similar event would include: Hemothorax and Chylothorax.   These are variations of the Pneumothorax, where instead of air around the collapsed  lung(s), you may have blood or chyle, respectively.       6. Spinal headaches: They may occur with any procedures in the area of the spine.       7. Persistent CSF (Cerebro-Spinal Fluid) leakage: This is a rare problem, but may occur  with prolonged intrathecal or epidural catheters either due to the formation of a fistulous  track or a dural tear.       8. Nerve damage: By working so close to the spinal cord, there is always a possibility of  nerve damage, which could be as serious as a permanent spinal cord injury with  paralysis.       9. Death:  Although rare, severe deadly allergic reactions known as "Anaphylactic  reaction" can occur to any of the medications used.      10. Worsening of the symptoms:  We can always make thing worse.  What are the chances  of something like this happening? Chances of any of this occuring are extremely low.  By statistics, you have more of a chance of getting killed in a motor vehicle accident: while driving to the hospital than any of the above occurring .  Nevertheless, you should be aware that they are possibilities.  In general, it is similar to taking a shower.  Everybody knows that you can slip, hit your head and get killed.  Does that mean that you should not shower again?  Nevertheless always keep in mind that statistics do not mean anything if you happen to be on the wrong side of them.  Even if a procedure has a 1 (one) in a  1,000,000 (million) chance of going wrong, it you happen to be that one..Also, keep in mind that by statistics, you have more of a chance of having something go wrong when taking medications.  Who should not have this procedure? If you are on a blood thinning medication (e.g. Coumadin, Plavix, see list of "Blood Thinners"), or if you have an active infection going on, you should not have the procedure.  If you are taking any blood thinners, please inform your physician.  How should I prepare for this procedure? Do not eat or drink anything at least six hours prior to the procedure. Bring a driver with you .  It cannot be a taxi. Come accompanied by an adult that can drive you back, and that is strong enough to help you if your legs get weak or numb from the local anesthetic. Take all of your medicines the morning of the procedure with just enough water to swallow them. If you have diabetes, make sure that you are scheduled to have your procedure done first thing in the morning, whenever possible. If you have diabetes, take only half of your insulin dose and notify our nurse that you have done so as soon as you arrive at the clinic. If you are diabetic, but only take blood sugar pills (oral hypoglycemic), then do not take them on the morning of your procedure.  You may take them after you have had the procedure. Do not take aspirin or any aspirin-containing medications, at least eleven (11) days prior to the procedure.  They may prolong bleeding. Wear loose fitting clothing that may be easy to take off and that you would not mind if it got stained with Betadine or blood. Do not wear any jewelry or perfume Remove any nail coloring.  It will interfere with some of our monitoring equipment.  NOTE: Remember that this is not meant to be interpreted as a complete list of all possible complications.  Unforeseen problems may occur.  BLOOD THINNERS The following drugs contain aspirin or other products,  which can cause increased bleeding during surgery and should not be taken for 2 weeks prior to and 1 week after surgery.  If you should need take something for relief of minor pain, you may take acetaminophen which is found in Tylenol,m Datril, Anacin-3 and Panadol. It is not blood thinner. The products listed below are.  Do not take any of the products listed below in addition to any listed on your instruction sheet.  A.P.C or A.P.C with Codeine Codeine Phosphate Capsules #3 Ibuprofen Ridaura  ABC compound Congesprin Imuran rimadil  Advil Cope Indocin Robaxisal  Alka-Seltzer Effervescent Pain Reliever and Antacid Coricidin or Coricidin-D  Indomethacin Rufen  Alka-Seltzer plus Cold Medicine Cosprin Ketoprofen S-A-C Tablets  Anacin Analgesic Tablets or Capsules Coumadin  Korlgesic Salflex  Anacin Extra Strength Analgesic tablets or capsules CP-2 Tablets Lanoril Salicylate  Anaprox Cuprimine Capsules Levenox Salocol  Anexsia-D Dalteparin Magan Salsalate  Anodynos Darvon compound Magnesium Salicylate Sine-off  Ansaid Dasin Capsules Magsal Sodium Salicylate  Anturane Depen Capsules Marnal Soma  APF Arthritis pain formula Dewitt's Pills Measurin Stanback  Argesic Dia-Gesic Meclofenamic Sulfinpyrazone  Arthritis Bayer Timed Release Aspirin Diclofenac Meclomen Sulindac  Arthritis pain formula Anacin Dicumarol Medipren Supac  Analgesic (Safety coated) Arthralgen Diffunasal Mefanamic Suprofen  Arthritis Strength Bufferin Dihydrocodeine Mepro Compound Suprol  Arthropan liquid Dopirydamole Methcarbomol with Aspirin Synalgos  ASA tablets/Enseals Disalcid Micrainin Tagament  Ascriptin Doan's Midol Talwin  Ascriptin A/D Dolene Mobidin Tanderil  Ascriptin Extra Strength Dolobid Moblgesic Ticlid  Ascriptin with Codeine Doloprin or Doloprin with Codeine Momentum Tolectin  Asperbuf Duoprin Mono-gesic Trendar  Aspergum Duradyne Motrin or Motrin IB Triminicin  Aspirin plain, buffered or enteric coated  Durasal Myochrisine Trigesic  Aspirin Suppositories Easprin Nalfon Trillsate  Aspirin with Codeine Ecotrin Regular or Extra Strength Naprosyn Uracel  Atromid-S Efficin Naproxen Ursinus  Auranofin Capsules Elmiron Neocylate Vanquish  Axotal Emagrin Norgesic Verin  Azathioprine Empirin or Empirin with Codeine Normiflo Vitamin E  Azolid Emprazil Nuprin Voltaren  Bayer Aspirin plain, buffered or children's or timed BC Tablets or powders Encaprin Orgaran Warfarin Sodium  Buff-a-Comp Enoxaparin Orudis Zorpin  Buff-a-Comp with Codeine Equegesic Os-Cal-Gesic   Buffaprin Excedrin plain, buffered or Extra Strength Oxalid   Bufferin Arthritis Strength Feldene Oxphenbutazone   Bufferin plain or Extra Strength Feldene Capsules Oxycodone with Aspirin   Bufferin with Codeine Fenoprofen Fenoprofen Pabalate or Pabalate-SF   Buffets II Flogesic Panagesic   Buffinol plain or Extra Strength Florinal or Florinal with Codeine Panwarfarin   Buf-Tabs Flurbiprofen Penicillamine   Butalbital Compound Four-way cold tablets Penicillin   Butazolidin Fragmin Pepto-Bismol   Carbenicillin Geminisyn Percodan   Carna Arthritis Reliever Geopen Persantine   Carprofen Gold's salt Persistin   Chloramphenicol Goody's Phenylbutazone   Chloromycetin Haltrain Piroxlcam   Clmetidine heparin Plaquenil   Cllnoril Hyco-pap Ponstel   Clofibrate Hydroxy chloroquine Propoxyphen         Before stopping any of these medications, be sure to consult the physician who ordered them.  Some, such as Coumadin (Warfarin) are ordered to prevent or treat serious conditions such as "deep thrombosis", "pumonary embolisms", and other heart problems.  The amount of time that you may need off of the medication may also vary with the medication and the reason for which you were taking it.  If you are taking any of these medications, please make sure you notify your pain physician before you undergo any procedures.         Facet Blocks Patient  Information  Description: The facets are joints in the spine between the vertebrae.  Like any joints in the body, facets can become irritated and painful.  Arthritis can also effect the facets.  By injecting steroids and local anesthetic in and around these joints, we can temporarily block the nerve supply to them.  Steroids act directly on irritated nerves and tissues to reduce selling and inflammation which often leads to decreased pain.  Facet blocks may be done anywhere along the spine from the neck to the low back depending upon the location of your pain.   After numbing the skin with local anesthetic (like Novocaine), a small needle is passed onto the facet joints under x-ray guidance.  You may experience a sensation of pressure while this is being done.  The  entire block usually lasts about 15-25 minutes.   Conditions which may be treated by facet blocks:  Low back/buttock pain Neck/shoulder pain Certain types of headaches  Preparation for the injection:  Do not eat any solid food or dairy products within 8 hours of your appointment. You may drink clear liquid up to 3 hours before appointment.  Clear liquids include water, black coffee, juice or soda.  No milk or cream please. You may take your regular medication, including pain medications, with a sip of water before your appointment.  Diabetics should hold regular insulin (if taken separately) and take 1/2 normal NPH dose the morning of the procedure.  Carry some sugar containing items with you to your appointment. A driver must accompany you and be prepared to drive you home after your procedure. Bring all your current medications with you. An IV may be inserted and sedation may be given at the discretion of the physician. A blood pressure cuff, EKG and other monitors will often be applied during the procedure.  Some patients may need to have extra oxygen administered for a short period. You will be asked to provide medical information,  including your allergies and medications, prior to the procedure.  We must know immediately if you are taking blood thinners (like Coumadin/Warfarin) or if you are allergic to IV iodine contrast (dye).  We must know if you could possible be pregnant.  Possible side-effects:  Bleeding from needle site Infection (rare, may require surgery) Nerve injury (rare) Numbness & tingling (temporary) Difficulty urinating (rare, temporary) Spinal headache (a headache worse with upright posture) Light-headedness (temporary) Pain at injection site (serveral days) Decreased blood pressure (rare, temporary) Weakness in arm/leg (temporary) Pressure sensation in back/neck (temporary)   Call if you experience:  Fever/chills associated with headache or increased back/neck pain Headache worsened by an upright position New onset, weakness or numbness of an extremity below the injection site Hives or difficulty breathing (go to the emergency room) Inflammation or drainage at the injection site(s) Severe back/neck pain greater than usual New symptoms which are concerning to you  Please note:  Although the local anesthetic injected can often make your back or neck feel good for several hours after the injection, the pain will likely return. It takes 3-7 days for steroids to work.  You may not notice any pain relief for at least one week.  If effective, we will often do a series of 2-3 injections spaced 3-6 weeks apart to maximally decrease your pain.  After the initial series, you may be a candidate for a more permanent nerve block of the facets.  If you have any questions, please call #[phone]) [phone] [facility] Clinic[facility] Hy[facility] intra-articular injection What is this medication? SODIUM HYALURONATE (SOE dee um hye al yoor ON ate) is used to treat pain in the knee due to osteoarthritis. This medicine may be used for other purposes; ask your health care provider or  pharmacist if you have questions. This medicine may be used for other purposes; ask your health care provider or pharmacist if you have questions. COMMON BRAND NAME(S): Amvisc, DUROLANE, Euflexxa, GELSYN-3, Hyalgan, Hymovis, Monovisc, Orthovisc, Supartz, Supartz FX, SynoJoynt, Triluron, TriVisc, VISCO What should I tell my care team before I take this medication? They need to know if you have any of these conditions: bleeding disorders glaucoma infection in the knee joint skin conditions or sensitivity skin infection an unusual allergic reaction to sodium hyaluronate, other medicines, foods, dyes, or preservatives. Different brands  of sodium hyaluronate contain different allergens. Some may contain egg. Talk to your doctor about your allergies to make sure that you get the right product. pregnant or trying to get pregnant breast-feeding How should I use this medication? This medicine is for injection into the knee joint. It is given by a health care professional in a hospital or clinic setting. Talk to your pediatrician regarding the use of this medicine in children. Special care may be needed. Overdosage: If you think you have taken too much of this medicine contact a poison control center or emergency room at once. NOTE: This medicine is only for you. Do not share this medicine with others. Overdosage: If you think you have taken too much of this medicine contact a poison control center or emergency room at once. NOTE: This medicine is only for you. Do not share this medicine with others. What if I miss a dose? This does not apply. What may interact with this medication? Interactions are not expected. This list may not describe all possible interactions. Give your health care provider a list of all the medicines, herbs, non-prescription drugs, or dietary supplements you use. Also tell them if you smoke, drink alcohol, or use illegal drugs. Some items may interact with your medicine. This  list may not describe all possible interactions. Give your health care provider a list of all the medicines, herbs, non-prescription drugs, or dietary supplements you use. Also tell them if you smoke, drink alcohol, or use illegal drugs. Some items may interact with your medicine. What should I watch for while using this medication? Tell your doctor or healthcare professional if your symptoms do not start to get better or if they get worse. If receiving this medicine for osteoarthritis, limit your activity after you receive your injection. Avoid physical activity for 48 hours following your injection to keep your knee from swelling. Do not stand on your feet for more than 1 hour at a time during the first 48 hours following your injection. Ask your doctor or healthcare professional about when you can begin major physical activity again. What side effects may I notice from receiving this medication? Side effects that you should report to your doctor or health care professional as soon as possible: allergic reactions like skin rash, itching or hives, swelling of the face, lips, or tongue dizziness facial flushing pain, tingling, numbness in the hands or feet vision changes if received this medicine during eye surgery Side effects that usually do not require medical attention (report to your doctor or health care professional if they continue or are bothersome): back pain bruising at site where injected chills diarrhea fever headache joint pain joint stiffness joint swelling muscle cramps muscle pain nausea, vomiting pain, redness, or irritation at site where injected weak or tired This list may not describe all possible side effects. Call your doctor for medical advice about side effects. You may report side effects to FDA at 1-800-FDA-1088. This list may not describe all possible side effects. Call your doctor for medical advice about side effects. You may report side effects to FDA at  1-800-FDA-1088. Where should I keep my medication? This drug is given in a hospital or clinic and will not be stored at home. NOTE: This sheet is a summary. It may not cover all possible information. If you have questions about this medicine, talk to your doctor, pharmacist, or health care provider.  2024 [employer] Standard (2015-09-01 00:00:00)

## 2023-06-26 ENCOUNTER — Telehealth: Payer: Self-pay

## 2023-06-26 NOTE — Telephone Encounter (Signed)
Patient has procedure 07/15/23 and needs instructions on what medicines she can take that morning

## 2023-06-26 NOTE — Telephone Encounter (Signed)
Pre-procedure instructions given to patient.

## 2023-07-15 ENCOUNTER — Ambulatory Visit
Admission: RE | Admit: 2023-07-15 | Discharge: 2023-07-15 | Disposition: A | Discharge status: Home / Self Care | Payer: Medicare PPO | Source: Ambulatory Visit | Attending: Student in an Organized Health Care Education/Training Program | Admitting: Student in an Organized Health Care Education/Training Program

## 2023-07-15 ENCOUNTER — Ambulatory Visit
Payer: Medicare PPO | Attending: Student in an Organized Health Care Education/Training Program | Admitting: Student in an Organized Health Care Education/Training Program

## 2023-07-15 ENCOUNTER — Encounter: Payer: Self-pay | Admitting: Student in an Organized Health Care Education/Training Program

## 2023-07-15 VITALS — BP 146/81 | HR 61 | Temp 97.5°F | Resp 18 | Ht 69.0 in | Wt 285.0 lb

## 2023-07-15 DIAGNOSIS — G894 Chronic pain syndrome: Secondary | ICD-10-CM | POA: Insufficient documentation

## 2023-07-15 DIAGNOSIS — M170 Bilateral primary osteoarthritis of knee: Secondary | ICD-10-CM | POA: Insufficient documentation

## 2023-07-15 DIAGNOSIS — M47816 Spondylosis without myelopathy or radiculopathy, lumbar region: Secondary | ICD-10-CM | POA: Diagnosis not present

## 2023-07-15 MED ORDER — SODIUM HYALURONATE (VISCOSUP) 20 MG/2ML IX SOSY
2.0000 mL | PREFILLED_SYRINGE | Freq: Once | INTRA_ARTICULAR | Status: AC
  Administered 2023-07-15: 20 mg via INTRA_ARTICULAR

## 2023-07-15 MED ORDER — DEXAMETHASONE SODIUM PHOSPHATE 10 MG/ML IJ SOLN
INTRAMUSCULAR | Status: AC
  Filled 2023-07-15: qty 1

## 2023-07-15 MED ORDER — ROPIVACAINE HCL 2 MG/ML IJ SOLN
18.0000 mL | Freq: Once | INTRAMUSCULAR | Status: AC
  Administered 2023-07-15: 18 mL via PERINEURAL

## 2023-07-15 MED ORDER — LIDOCAINE HCL 2 % IJ SOLN
20.0000 mL | Freq: Once | INTRAMUSCULAR | Status: AC
  Administered 2023-07-15: 100 mg

## 2023-07-15 MED ORDER — DEXAMETHASONE SODIUM PHOSPHATE 10 MG/ML IJ SOLN
20.0000 mg | Freq: Once | INTRAMUSCULAR | Status: AC
  Administered 2023-07-15: 20 mg

## 2023-07-15 MED ORDER — ROPIVACAINE HCL 2 MG/ML IJ SOLN
INTRAMUSCULAR | Status: AC
  Filled 2023-07-15: qty 20

## 2023-07-15 MED ORDER — LIDOCAINE HCL 2 % IJ SOLN
INTRAMUSCULAR | Status: AC
  Filled 2023-07-15: qty 20

## 2023-07-15 MED ORDER — LIDOCAINE HCL (PF) 2 % IJ SOLN
INTRAMUSCULAR | Status: AC
  Filled 2023-07-15: qty 5

## 2023-07-15 NOTE — Progress Notes (Signed)
PROVIDER NOTE: Interpretation of information contained herein should be left to medically-trained personnel. Specific patient instructions are provided elsewhere under "Patient Instructions" section of medical record. This document was created in part using STT-dictation technology, any transcriptional errors that may result from this process are unintentional.  Patient: Tara Potter Type: Established DOB: 1946/05/04 MRN: [id number] PCP: Erasmo Downer, MD  Service: Procedure DOS: 07/15/2023 Setting: Ambulatory Location: Ambulatory outpatient facility Delivery: Face-to-face Provider: Edward Jolly, MD Specialty: Interventional Pain Management Specialty designation: 09 Location: Outpatient facility Ref. Prov.: Edward Jolly, MD       Interventional Therapy   Type:  Hyalgan Intra-articular Knee Injection #1  Laterality: Bilateral (-50) Level/approach: Medial Imaging guidance: None required ([id number]) Anesthesia: Local anesthesia (1-2% Lidocaine) Sedation: No Sedation                       DOS: 07/15/2023  Performed by: Edward Jolly, MD  Purpose: Diagnostic/Therapeutic Indications: Knee arthralgia associated to osteoarthritis of the knee  NAS-11 score:   Pre-procedure: 8 /10   Post-procedure: 0-No pain/10     Pre-Procedure Preparation  Monitoring: As per clinic protocol.  Risk Assessment: Vitals:  [id number] body mass index is 42.09 kg/m as calculated from the following:   Height as of this encounter: 5\' 9"  (1.753 m).   Weight as of this encounter: 285 lb (129.3 kg)., Rate:61ECG Heart Rate: (!) 56, BP:(!) 121/57, Resp:16, Temp:(!) 97.5 F (36.4 C), SpO2:96 %  Allergies: She is allergic to augmentin [amoxicillin-pot clavulanate], amoxicillin, entex t [pseudoephedrine-guaifenesin], and erythromycin.  Precautions: No additional precautions required  Blood-thinner(s): None at this time  Coagulopathies: Reviewed. None identified.   Active Infection(s): Reviewed. None  identified. Tara Potter is afebrile   Location setting: Exam room Position: Sitting w/ knee bent 90 degrees Safety Precautions: Patient was assessed for positional comfort and pressure points before starting the procedure. Prepping solution: DuraPrep (Iodine Povacrylex [0.7% available iodine] and Isopropyl Alcohol, 74% w/w) Prep Area: Entire knee region Approach: percutaneous, just above the tibial plateau, lateral to the infrapatellar tendon. Intended target: Intra-articular knee space Materials: Tray: Block Needle(s): Regular Qty: 1/side Length: 1.5-inch Gauge: 25G (x1) + 22G (x1)  Meds ordered this encounter  Medications   lidocaine (XYLOCAINE) 2 % (with pres) injection 400 mg   ropivacaine (PF) 2 mg/mL (0.2%) (NAROPIN) injection 18 mL   dexamethasone (DECADRON) injection 20 mg   Sodium Hyaluronate (Viscosup) SOSY 20 mg    Do not substitute. Deliver to facility day before procedure.   Sodium Hyaluronate (Viscosup) SOSY 20 mg    Do not substitute. Deliver to facility day before procedure.    Orders Placed This Encounter  Procedures   DG PAIN [facility] C-ARM 1-60 MIN NO REPORT    Intraoperative interpretation by procedural physician at [facility].    Standing Status:   Standing    Number of Occurrences:   1    Order Specific Question:   Reason for exam:    Answer:   Assistance in needle guidance and placement for procedures requiring needle placement in or near specific anatomical locations not easily accessible without such assistance.     Time-out: 1139 I initiated and conducted the "Time-out" before starting the procedure, as per protocol. The patient was asked to participate by confirming the accuracy of the "Time Out" information. Verification of the correct person, site, and procedure were performed and confirmed by me, the nursing staff, and the patient. "Time-out" conducted as per Joint Commission's Universal Protocol (UP.01.01.01).  Procedure checklist:  Completed  H&P (Pre-op  Assessment)  Ms. Code is a 77 y.o. (year old), female patient, seen today for interventional treatment. She  has a past surgical history that includes Tubal ligation and Dilation and curettage of uterus. Tara Potter has a current medication list which includes the following prescription(s): acetaminophen, aspirin ec, atorvastatin, bisoprolol-hydrochlorothiazide, clobetasol cream, cvs acetaminophen, diclofenac, diclofenac sodium, estradiol, famotidine, fluticasone, gabapentin, levothyroxine, loratadine, and multiple vitamins-minerals. Her primarily concern today is the Back Pain (low) and Knee Pain (bilateral)  She is allergic to augmentin [amoxicillin-pot clavulanate], amoxicillin, entex t [pseudoephedrine-guaifenesin], and erythromycin.   Last encounter: My last encounter with her was on 06/25/2023. Pertinent problems: Tara Potter has HLD (hyperlipidemia); Essential (primary) hypertension; Morbid obesity (HCC); Lumbar spondylosis; Sciatica of left side; Bilateral primary osteoarthritis of knee; Frequency of urination; Lumbar degenerative disc disease; Chronic pain of both knees; Sacroiliac joint pain; Bilateral hip pain; and Chronic pain syndrome on their pertinent problem list. Pain Assessment: Severity of Chronic pain is reported as a 8 /10. Location: Back Lower/knees. Onset: More than a month ago. Quality: Aching (hurts when up and walking). Timing: Intermittent. Modifying factor(s): rest, medications, topicals. Vitals:  height is 5\' 9"  (1.753 m) and weight is 285 lb (129.3 kg). Her temporal temperature is 97.5 F (36.4 C) (abnormal). Her blood pressure is 146/81 (abnormal) and her pulse is 61. Her respiration is 18 and oxygen saturation is 97%.   Reason for encounter: "interventional pain management therapy due pain of at least four (4) weeks in duration, with failure to respond and/or inability to tolerate more conservative care.  Site Confirmation: Tara Potter was asked to confirm the  procedure and laterality before marking the site.  Consent: Before the procedure and under the influence of no sedative(s), amnesic(s), or anxiolytics, the patient was informed of the treatment options, risks and possible complications. To fulfill our ethical and legal obligations, as recommended by the American Medical Association's Code of Ethics, I have informed the patient of my clinical impression; the nature and purpose of the treatment or procedure; the risks, benefits, and possible complications of the intervention; the alternatives, including doing nothing; the risk(s) and benefit(s) of the alternative treatment(s) or procedure(s); and the risk(s) and benefit(s) of doing nothing. The patient was provided information about the general risks and possible complications associated with the procedure. These may include, but are not limited to: failure to achieve desired goals, infection, bleeding, organ or nerve damage, allergic reactions, paralysis, and death. In addition, the patient was informed of those risks and complications associated to Spine-related procedures, such as failure to decrease pain; infection (i.e.: Meningitis, epidural or intraspinal abscess); bleeding (i.e.: epidural hematoma, subarachnoid hemorrhage, or any other type of intraspinal or peri-dural bleeding); organ or nerve damage (i.e.: Any type of peripheral nerve, nerve root, or spinal cord injury) with subsequent damage to sensory, motor, and/or autonomic systems, resulting in permanent pain, numbness, and/or weakness of one or several areas of the body; allergic reactions; (i.e.: anaphylactic reaction); and/or death. Furthermore, the patient was informed of those risks and complications associated with the medications. These include, but are not limited to: allergic reactions (i.e.: anaphylactic or anaphylactoid reaction(s)); adrenal axis suppression; blood sugar elevation that in diabetics may result in ketoacidosis or comma;  water retention that in patients with history of congestive heart failure may result in shortness of breath, pulmonary edema, and decompensation with resultant heart failure; weight gain; swelling or edema; medication-induced neural toxicity; particulate matter embolism and blood vessel occlusion  with resultant organ, and/or nervous system infarction; and/or aseptic necrosis of one or more joints. Finally, the patient was informed that Medicine is not an exact science; therefore, there is also the possibility of unforeseen or unpredictable risks and/or possible complications that may result in a catastrophic outcome. The patient indicated having understood very clearly. We have given the patient no guarantees and we have made no promises. Enough time was given to the patient to ask questions, all of which were answered to the patient's satisfaction. Ms. Minnig has indicated that she wanted to continue with the procedure. Attestation: I, the ordering provider, attest that I have discussed with the patient the benefits, risks, side-effects, alternatives, likelihood of achieving goals, and potential problems during recovery for the procedure that I have provided informed consent.  Date  Time: 07/15/2023 10:47 AM  Description of procedure  Start Time: 1139 hrs  Local Anesthesia: Once the patient was positioned, prepped, and time-out was completed. The target area was identified located. The skin was marked with an approved surgical skin marker. Once marked, the skin (epidermis, dermis, and hypodermis), and deeper tissues (fat, connective tissue and muscle) were infiltrated with a small amount of a short-acting local anesthetic, loaded on a 10cc syringe with a 25G, 1.5-in  Needle. An appropriate amount of time was allowed for local anesthetics to take effect before proceeding to the next step. Local Anesthetic: Lidocaine 1-2% The unused portion of the local anesthetic was discarded in the proper designated  containers. Safety Precautions: Aspiration looking for blood return was conducted prior to all injections. At no point did I inject any substances, as a needle was being advanced. Before injecting, the patient was told to immediately notify me if she was experiencing any new onset of "ringing in the ears, or metallic taste in the mouth". No attempts were made at seeking any paresthesias. Safe injection practices and needle disposal techniques used. Medications properly checked for expiration dates. SDV (single dose vial) medications used. After the completion of the procedure, all disposable equipment used was discarded in the proper designated medical waste containers.  Technical description: Protocol guidelines were followed. After positioning, the target area was identified and prepped in the usual manner. Skin & deeper tissues infiltrated with local anesthetic. Appropriate amount of time allowed to pass for local anesthetics to take effect. Proper needle placement secured. Once satisfactory needle placement was confirmed, I proceeded to inject the desired solution in slow, incremental fashion, intermittently assessing for discomfort or any signs of abnormal or undesired spread of substance. Once completed, the needle was removed and disposed of, as per hospital protocols. The area was cleaned, making sure to leave some of the prepping solution back to take advantage of its long term bactericidal properties.  Aspiration:  Negative        Vitals:   07/15/23 1101 07/15/23 1137 07/15/23 1143 07/15/23 1147  BP: (!) 121/57 129/80 (!) 141/85 (!) 146/81  Pulse: 61     Resp: 16 18 15 18   Temp: (!) 97.5 F (36.4 C)     TempSrc: Temporal     SpO2: 96% 96% 98% 97%  Weight: 285 lb (129.3 kg)     Height: 5\' 9"  (1.753 m)       End Time: 1146 hrs  Imaging guidance  Imaging-assisted Technique: None required. Indication(s): N/A Exposure Time: N/A Contrast: None Fluoroscopic Guidance:  N/A Ultrasound Guidance: N/A Interpretation: N/A  Post-op assessment  Post-procedure Vital Signs:  Pulse/HCG Rate: 6165 Temp: (!) 97.5 F (36.4  C) Resp: 18 BP: (!) 146/81 SpO2: 97 %  EBL: None  Complications: No immediate post-treatment complications observed by team, or reported by patient.  Note: The patient tolerated the entire procedure well. A repeat set of vitals were taken after the procedure and the patient was kept under observation following institutional policy, for this type of procedure. Post-procedural neurological assessment was performed, showing return to baseline, prior to discharge. The patient was provided with post-procedure discharge instructions, including a section on how to identify potential problems. Should any problems arise concerning this procedure, the patient was given instructions to immediately contact us, at any time, without hesitation. In any case, we plan to contact the patient by telephone for a follow-up status report regarding this interventional procedure.  Comments:  No additional relevant information.  Plan of care   Medications administered: We administered lidocaine, ropivacaine (PF) 2 mg/mL (0.2%), dexamethasone, Sodium Hyaluronate (Viscosup), and Sodium Hyaluronate (Viscosup).  Follow-up plan:   Return in about 4 weeks (around 08/12/2023), or 4-5 weeks VV PPE.     Recent Visits Date Type Provider Dept  06/25/23 Office Visit Edward Jolly, MD [facility]  Showing recent visits within past 90 days and meeting all other requirements Today's Visits Date Type Provider Dept  07/15/23 Procedure visit Edward Jolly, MD [facility]  Showing today's visits and meeting all other requirements Future Appointments Date Type Provider Dept  08/12/23 Appointment Edward Jolly, MD [facility]  Showing future appointments within next 90 days and meeting all other requirements   Disposition: Discharge home   Discharge (Date  Time): 07/15/2023; 1200 hrs.   Primary Care Physician: Erasmo Downer, MD Location: [facility] Note by: Edward Jolly, MD Date: 07/15/2023; Time: 12:27 PM  DISCLAIMER: Medicine is not an exact science. It has no guarantees or warranties. The decision to proceed with this intervention was based on the information collected from the patient. Conclusions were drawn from the patient's questionnaire, interview, and examination. Because information was provided in large part by the patient, it cannot be guaranteed that it has not been purposely or unconsciously manipulated or altered. Every effort has been made to obtain as much accurate, relevant, available data as possible. Always take into account that the treatment will also be dependent on availability of resources and existing treatment guidelines, considered by other Pain Management Specialists as being common knowledge and practice, at the time of the intervention. It is also important to point out that variation in procedural techniques and pharmacological choices are the acceptable norm. For Medico-Legal review purposes, the indications, contraindications, technique, and results of the these procedures should only be evaluated, judged and interpreted by a Board-Certified Interventional Pain Specialist with extensive familiarity and expertise in the same exact procedure and technique.

## 2023-07-15 NOTE — Progress Notes (Signed)
PROVIDER NOTE: Interpretation of information contained herein should be left to medically-trained personnel. Specific patient instructions are provided elsewhere under "Patient Instructions" section of medical record. This document was created in part using STT-dictation technology, any transcriptional errors that may result from this process are unintentional.  Patient: Tara Potter Type: Established DOB: 1946/07/11 MRN: [id number] PCP: Erasmo Downer, MD  Service: Procedure DOS: 07/15/2023 Setting: Ambulatory Location: Ambulatory outpatient facility Delivery: Face-to-face Provider: Edward Jolly, MD Specialty: Interventional Pain Management Specialty designation: 09 Location: Outpatient facility Ref. Prov.: Edward Jolly, MD       Interventional Therapy   Type: Lumbar Facet, Medial Branch Block(s) (w/ fluoroscopic mapping)          Laterality: Bilateral  Level: L3, L4, and L5 Medial Branch Level(s). Injecting these levels blocks the L3-4 and L4-5 lumbar facet joints. Imaging: Fluoroscopic guidance         Anesthesia: Local anesthesia (1-2% Lidocaine) Sedation: No Sedation                       DOS: 07/15/2023 Performed by: Edward Jolly, MD  Primary Purpose: Diagnostic/Therapeutic Indications: Low back pain severe enough to impact quality of life or function. 1. Lumbar facet arthropathy   2. Bilateral primary osteoarthritis of knee   3. Chronic pain syndrome   4. Lumbar spondylosis    NAS-11 Pain score:   Pre-procedure: 8 /10   Post-procedure: 0-No pain/10     Position / Prep / Materials:  Position: Prone  Prep solution: ChloraPrep (2% chlorhexidine gluconate and 70% isopropyl alcohol) Area Prepped: Posterolateral Lumbosacral Spine (Wide prep: From the lower border of the scapula down to the end of the tailbone and from flank to flank.)  Materials:  Tray: Block Needle(s):  Type: Spinal  Gauge (G): 22  Length: 5-in Qty: 2      H&P (Pre-op Assessment):  [name] is  a 77 y.o. (year old), female patient, seen today for interventional treatment. She  has a past surgical history that includes Tubal ligation and Dilation and curettage of uterus. [name] has a current medication list which includes the following prescription(s): acetaminophen, aspirin ec, atorvastatin, bisoprolol-hydrochlorothiazide, clobetasol cream, cvs acetaminophen, diclofenac, diclofenac sodium, estradiol, famotidine, fluticasone, gabapentin, levothyroxine, loratadine, and multiple vitamins-minerals. Her primarily concern today is the Back Pain (low) and Knee Pain (bilateral)  Initial Vital Signs:  Pulse/HCG Rate: 61ECG Heart Rate: (!) 56 Temp: (!) 97.5 F (36.4 C) Resp: 16 BP: (!) 121/57 SpO2: 96 %  BMI: Estimated body mass index is 42.09 kg/m as calculated from the following:   Height as of this encounter: 5\' 9"  (1.753 m).   Weight as of this encounter: 285 lb (129.3 kg).  Risk Assessment: Allergies: Reviewed. She is allergic to augmentin [amoxicillin-pot clavulanate], amoxicillin, entex t [pseudoephedrine-guaifenesin], and erythromycin.  Allergy Precautions: None required Coagulopathies: Reviewed. None identified.  Blood-thinner therapy: None at this time Active Infection(s): Reviewed. None identified. [name] is afebrile  Site Confirmation: [name] was asked to confirm the procedure and laterality before marking the site Procedure checklist: Completed Consent: Before the procedure and under the influence of no sedative(s), amnesic(s), or anxiolytics, the patient was informed of the treatment options, risks and possible complications. To fulfill our ethical and legal obligations, as recommended by the American Medical Association's Code of Ethics, I have informed the patient of my clinical impression; the nature and purpose of the treatment or procedure; the risks, benefits, and possible complications of the intervention; the alternatives,  including doing nothing; the risk(s) and  benefit(s) of the alternative treatment(s) or procedure(s); and the risk(s) and benefit(s) of doing nothing. The patient was provided information about the general risks and possible complications associated with the procedure. These may include, but are not limited to: failure to achieve desired goals, infection, bleeding, organ or nerve damage, allergic reactions, paralysis, and death. In addition, the patient was informed of those risks and complications associated to Spine-related procedures, such as failure to decrease pain; infection (i.e.: Meningitis, epidural or intraspinal abscess); bleeding (i.e.: epidural hematoma, subarachnoid hemorrhage, or any other type of intraspinal or peri-dural bleeding); organ or nerve damage (i.e.: Any type of peripheral nerve, nerve root, or spinal cord injury) with subsequent damage to sensory, motor, and/or autonomic systems, resulting in permanent pain, numbness, and/or weakness of one or several areas of the body; allergic reactions; (i.e.: anaphylactic reaction); and/or death. Furthermore, the patient was informed of those risks and complications associated with the medications. These include, but are not limited to: allergic reactions (i.e.: anaphylactic or anaphylactoid reaction(s)); adrenal axis suppression; blood sugar elevation that in diabetics may result in ketoacidosis or comma; water retention that in patients with history of congestive heart failure may result in shortness of breath, pulmonary edema, and decompensation with resultant heart failure; weight gain; swelling or edema; medication-induced neural toxicity; particulate matter embolism and blood vessel occlusion with resultant organ, and/or nervous system infarction; and/or aseptic necrosis of one or more joints. Finally, the patient was informed that Medicine is not an exact science; therefore, there is also the possibility of unforeseen or unpredictable risks and/or possible complications that may  result in a catastrophic outcome. The patient indicated having understood very clearly. We have given the patient no guarantees and we have made no promises. Enough time was given to the patient to ask questions, all of which were answered to the patient's satisfaction. Ms. Schau has indicated that she wanted to continue with the procedure. Attestation: I, the ordering provider, attest that I have discussed with the patient the benefits, risks, side-effects, alternatives, likelihood of achieving goals, and potential problems during recovery for the procedure that I have provided informed consent. Date  Time: 07/15/2023 10:47 AM   Pre-Procedure Preparation:  Monitoring: As per clinic protocol. Respiration, ETCO2, SpO2, BP, heart rate and rhythm monitor placed and checked for adequate function Safety Precautions: Patient was assessed for positional comfort and pressure points before starting the procedure. Time-out: I initiated and conducted the "Time-out" before starting the procedure, as per protocol. The patient was asked to participate by confirming the accuracy of the "Time Out" information. Verification of the correct person, site, and procedure were performed and confirmed by me, the nursing staff, and the patient. "Time-out" conducted as per Joint Commission's Universal Protocol (UP.01.01.01). Time: 1139 Start Time: 1139 hrs.  Description of Procedure:          Laterality: (see above) Targeted Levels: (see above)  Safety Precautions: Aspiration looking for blood return was conducted prior to all injections. At no point did we inject any substances, as a needle was being advanced. Before injecting, the patient was told to immediately notify me if she was experiencing any new onset of "ringing in the ears, or metallic taste in the mouth". No attempts were made at seeking any paresthesias. Safe injection practices and needle disposal techniques used. Medications properly checked for expiration  dates. SDV (single dose vial) medications used. After the completion of the procedure, all disposable equipment used was discarded in the  proper designated Metallurgist. Local Anesthesia: Protocol guidelines were followed. The patient was positioned over the fluoroscopy table. The area was prepped in the usual manner. The time-out was completed. The target area was identified using fluoroscopy. A 12-in long, straight, sterile hemostat was used with fluoroscopic guidance to locate the targets for each level blocked. Once located, the skin was marked with an approved surgical skin marker. Once all sites were marked, the skin (epidermis, dermis, and hypodermis), as well as deeper tissues (fat, connective tissue and muscle) were infiltrated with a small amount of a short-acting local anesthetic, loaded on a 10cc syringe with a 25G, 1.5-in  Needle. An appropriate amount of time was allowed for local anesthetics to take effect before proceeding to the next step. Local Anesthetic: Lidocaine 2.0% The unused portion of the local anesthetic was discarded in the proper designated containers. Technical description of process:   L3 Medial Branch Nerve Block (MBB): The target area for the L3 medial branch is at the junction of the postero-lateral aspect of the superior articular process and the superior, posterior, and medial edge of the transverse process of L4. Under fluoroscopic guidance, a Quincke needle was inserted until contact was made with os over the superior postero-lateral aspect of the pedicular shadow (target area). After negative aspiration for blood, 2mL of the nerve block solution was injected without difficulty or complication. The needle was removed intact. L4 Medial Branch Nerve Block (MBB): The target area for the L4 medial branch is at the junction of the postero-lateral aspect of the superior articular process and the superior, posterior, and medial edge of the transverse process of L5.  Under fluoroscopic guidance, a Quincke needle was inserted until contact was made with os over the superior postero-lateral aspect of the pedicular shadow (target area). After negative aspiration for blood, 2mL of the nerve block solution was injected without difficulty or complication. The needle was removed intact. L5 Medial Branch Nerve Block (MBB): The target area for the L5 medial branch is at the junction of the postero-lateral aspect of the superior articular process and the superior, posterior, and medial edge of the sacral ala. Under fluoroscopic guidance, a Quincke needle was inserted until contact was made with os over the superior postero-lateral aspect of the pedicular shadow (target area). After negative aspiration for blood, 2mL of the nerve block solution was injected without difficulty or complication. The needle was removed intact.   Once the entire procedure was completed, the treated area was cleaned, making sure to leave some of the prepping solution back to take advantage of its long term bactericidal properties.         Illustration of the posterior view of the lumbar spine and the posterior neural structures. Laminae of L2 through S1 are labeled. DPRL5, dorsal primary ramus of L5; DPRS1, dorsal primary ramus of S1; DPR3, dorsal primary ramus of L3; FJ, facet (zygapophyseal) joint L3-L4; I, inferior articular process of L4; LB1, lateral branch of dorsal primary ramus of L1; IAB, inferior articular branches from L3 medial branch (supplies L4-L5 facet joint); IBP, intermediate branch plexus; MB3, medial branch of dorsal primary ramus of L3; NR3, third lumbar nerve root; S, superior articular process of L5; SAB, superior articular branches from L4 (supplies L4-5 facet joint also); TP3, transverse process of L3.   Facet Joint Innervation (* possible contribution)  L1-2 T12, L1 (L2*)  Medial Branch  L2-3 L1, L2 (L3*)         "          "  L3-4 L2, L3 (L4*)         "          "   L4-5 L3, L4 (L5*)         "          "  L5-S1 L4, L5, S1          "          "    Vitals:   07/15/23 1101 07/15/23 1137 07/15/23 1143 07/15/23 1147  BP: (!) 121/57 129/80 (!) 141/85 (!) 146/81  Pulse: 61     Resp: 16 18 15 18   Temp: (!) 97.5 F (36.4 C)     TempSrc: Temporal     SpO2: 96% 96% 98% 97%  Weight: 285 lb (129.3 kg)     Height: 5\' 9"  (1.753 m)        End Time: 1146 hrs.  Imaging Guidance (Spinal):          Type of Imaging Technique: Fluoroscopy Guidance (Spinal) Indication(s): Fluoroscopy guidance for needle placement to enhance accuracy in procedures requiring precise needle localization for targeted delivery of medication in or near specific anatomical locations not easily accessible without such real-time imaging assistance. Exposure Time: Please see nurses notes. Contrast: None used. Fluoroscopic Guidance: I was personally present during the use of fluoroscopy. "Tunnel Vision Technique" used to obtain the best possible view of the target area. Parallax error corrected before commencing the procedure. "Direction-depth-direction" technique used to introduce the needle under continuous pulsed fluoroscopy. Once target was reached, antero-posterior, oblique, and lateral fluoroscopic projection used confirm needle placement in all planes. Images permanently stored in EMR. Interpretation: No contrast injected. I personally interpreted the imaging intraoperatively. Adequate needle placement confirmed in multiple planes. Permanent images saved into the patient's record.  Post-operative Assessment:  Post-procedure Vital Signs:  Pulse/HCG Rate: 6165 Temp: (!) 97.5 F (36.4 C) Resp: 18 BP: (!) 146/81 SpO2: 97 %  EBL: None  Complications: No immediate post-treatment complications observed by team, or reported by patient.  Note: The patient tolerated the entire procedure well. A repeat set of vitals were taken after the procedure and the patient was kept under observation  following institutional policy, for this type of procedure. Post-procedural neurological assessment was performed, showing return to baseline, prior to discharge. The patient was provided with post-procedure discharge instructions, including a section on how to identify potential problems. Should any problems arise concerning this procedure, the patient was given instructions to immediately contact us, at any time, without hesitation. In any case, we plan to contact the patient by telephone for a follow-up status report regarding this interventional procedure.  Comments:  No additional relevant information.  Plan of Care (POC)  Orders:  Orders Placed This Encounter  Procedures   DG PAIN CLINIC C-ARM 1-60 MIN NO REPORT    Intraoperative interpretation by procedural physician at [facility].    Standing Status:   Standing    Number of Occurrences:   1    Order Specific Question:   Reason for exam:    Answer:   Assistance in needle guidance and placement for procedures requiring needle placement in or near specific anatomical locations not easily accessible without such assistance.    Medications ordered for procedure: Meds ordered this encounter  Medications   lidocaine (XYLOCAINE) 2 % (with pres) injection 400 mg   ropivacaine (PF) 2 mg/mL (0.2%) (NAROPIN) injection 18 mL   dexamethasone (DECADRON) injection 20 mg   Sodium Hyaluronate (  Viscosup) SOSY 20 mg    Do not substitute. Deliver to facility day before procedure.   Sodium Hyaluronate (Viscosup) SOSY 20 mg    Do not substitute. Deliver to facility day before procedure.   Medications administered: We administered lidocaine, ropivacaine (PF) 2 mg/mL (0.2%), dexamethasone, Sodium Hyaluronate (Viscosup), and Sodium Hyaluronate (Viscosup).  See the medical record for exact dosing, route, and time of administration.  Follow-up plan:   Return in about 4 weeks (around 08/12/2023), or 4-5 weeks VV PPE.       Bilateral lumbar  facets L3, L4, L5 07/15/2023, bilateral knee Hyalgan 07/15/2023    Recent Visits Date Type Provider Dept  06/25/23 Office Visit Edward Jolly, MD [facility]  Showing recent visits within past 90 days and meeting all other requirements Today's Visits Date Type Provider Dept  07/15/23 Procedure visit Edward Jolly, MD [facility]  Showing today's visits and meeting all other requirements Future Appointments Date Type Provider Dept  08/12/23 Appointment Edward Jolly, MD [facility]  Showing future appointments within next 90 days and meeting all other requirements  Disposition: Discharge home  Discharge (Date  Time): 07/15/2023; 1200 hrs.   Primary Care Physician: Erasmo Downer, MD Location: [facility] Note by: Edward Jolly, MD (TTS technology used. I apologize for any typographical errors that were not detected and corrected.) Date: 07/15/2023; Time: 12:25 PM  Disclaimer:  Medicine is not an Visual merchandiser. The only guarantee in medicine is that nothing is guaranteed. It is important to note that the decision to proceed with this intervention was based on the information collected from the patient. The Data and conclusions were drawn from the patient's questionnaire, the interview, and the physical examination. Because the information was provided in large part by the patient, it cannot be guaranteed that it has not been purposely or unconsciously manipulated. Every effort has been made to obtain as much relevant data as possible for this evaluation. It is important to note that the conclusions that lead to this procedure are derived in large part from the available data. Always take into account that the treatment will also be dependent on availability of resources and existing treatment guidelines, considered by other Pain Management Practitioners as being common knowledge and practice, at the time of the intervention. For  Medico-Legal purposes, it is also important to point out that variation in procedural techniques and pharmacological choices are the acceptable norm. The indications, contraindications, technique, and results of the above procedure should only be interpreted and judged by a Board-Certified Interventional Pain Specialist with extensive familiarity and expertise in the same exact procedure and technique.

## 2023-07-15 NOTE — Patient Instructions (Signed)
Pain Management Discharge Instructions  General Discharge Instructions :  If you need to reach your doctor call: Monday-Friday 8:00 am - 4:00 pm at [phone] or toll free [phone].  After clinic hours [phone] to have operator reach doctor.  Bring all of your medication bottles to all your appointments in the pain clinic.  To cancel or reschedule your appointment with Pain Management please remember to call 24 hours in advance to avoid a fee.  Refer to the educational materials which you have been given on: General Risks, I had my Procedure. Discharge Instructions, Post Sedation.  Post Procedure Instructions:  The drugs you were given will stay in your system until tomorrow, so for the next 24 hours you should not drive, make any legal decisions or drink any alcoholic beverages.  You may eat anything you prefer, but it is better to start with liquids then soups and crackers, and gradually work up to solid foods.  Please notify your doctor immediately if you have any unusual bleeding, trouble breathing or pain that is not related to your normal pain.  Depending on the type of procedure that was done, some parts of your body may feel week and/or numb.  This usually clears up by tonight or the next day.  Walk with the use of an assistive device or accompanied by an adult for the 24 hours.  You may use ice on the affected area for the first 24 hours.  Put ice in a Ziploc bag and cover with a towel and place against area 15 minutes on 15 minutes off.  You may switch to heat after 24 hours.Facet Blocks Patient Information  Description: The facets are joints in the spine between the vertebrae.  Like any joints in the body, facets can become irritated and painful.  Arthritis can also effect the facets.  By injecting steroids and local anesthetic in and around these joints, we can temporarily block the nerve supply to them.  Steroids act directly on irritated nerves and tissues to  reduce selling and inflammation which often leads to decreased pain.  Facet blocks may be done anywhere along the spine from the neck to the low back depending upon the location of your pain.   After numbing the skin with local anesthetic (like Novocaine), a small needle is passed onto the facet joints under x-ray guidance.  You may experience a sensation of pressure while this is being done.  The entire block usually lasts about 15-25 minutes.   Conditions which may be treated by facet blocks:  Low back/buttock pain Neck/shoulder pain Certain types of headaches  Preparation for the injection:  Do not eat any solid food or dairy products within 8 hours of your appointment. You may drink clear liquid up to 3 hours before appointment.  Clear liquids include water, black coffee, juice or soda.  No milk or cream please. You may take your regular medication, including pain medications, with a sip of water before your appointment.  Diabetics should hold regular insulin (if taken separately) and take 1/2 normal NPH dose the morning of the procedure.  Carry some sugar containing items with you to your appointment. A driver must accompany you and be prepared to drive you home after your procedure. Bring all your current medications with you. An IV may be inserted and sedation may be given at the discretion of the physician. A blood pressure cuff, EKG and other monitors will often be applied during the procedure.  Some patients may need to   have extra oxygen administered for a short period. You will be asked to provide medical information, including your allergies and medications, prior to the procedure.  We must know immediately if you are taking blood thinners (like Coumadin/Warfarin) or if you are allergic to IV iodine contrast (dye).  We must know if you could possible be pregnant.  Possible side-effects:  Bleeding from needle site Infection (rare, may require surgery) Nerve injury (rare) Numbness  & tingling (temporary) Difficulty urinating (rare, temporary) Spinal headache (a headache worse with upright posture) Light-headedness (temporary) Pain at injection site (serveral days) Decreased blood pressure (rare, temporary) Weakness in arm/leg (temporary) Pressure sensation in back/neck (temporary)   Call if you experience:  Fever/chills associated with headache or increased back/neck pain Headache worsened by an upright position New onset, weakness or numbness of an extremity below the injection site Hives or difficulty breathing (go to the emergency room) Inflammation or drainage at the injection site(s) Severe back/neck pain greater than usual New symptoms which are concerning to you  Please note:  Although the local anesthetic injected can often make your back or neck feel good for several hours after the injection, the pain will likely return. It takes 3-7 days for steroids to work.  You may not notice any pain relief for at least one week.  If effective, we will often do a series of 2-3 injections spaced 3-6 weeks apart to maximally decrease your pain.  After the initial series, you may be a candidate for a more permanent nerve block of the facets.  If you have any questions, please call [phone] [facility] 

## 2023-07-16 ENCOUNTER — Telehealth: Payer: Self-pay | Admitting: *Deleted

## 2023-07-16 NOTE — Telephone Encounter (Signed)
No problems post procedure. 

## 2023-08-12 ENCOUNTER — Ambulatory Visit
Payer: Medicare PPO | Attending: Student in an Organized Health Care Education/Training Program | Admitting: Student in an Organized Health Care Education/Training Program

## 2023-08-12 ENCOUNTER — Encounter: Payer: Self-pay | Admitting: Student in an Organized Health Care Education/Training Program

## 2023-08-12 DIAGNOSIS — M47816 Spondylosis without myelopathy or radiculopathy, lumbar region: Secondary | ICD-10-CM

## 2023-08-12 DIAGNOSIS — G894 Chronic pain syndrome: Secondary | ICD-10-CM | POA: Diagnosis not present

## 2023-08-12 NOTE — Progress Notes (Signed)
Patient: Tara Potter  Service Category: E/M  Provider: Edward Jolly, MD  DOB: 31-Oct-1945  DOS: 08/12/2023  Location: Office  MRN: [id number]  Setting: Ambulatory outpatient  Referring Provider: Erasmo Downer, MD  Type: Established Patient  Specialty: Interventional Pain Management  PCP: Erasmo Downer, MD  Location: Remote location  Delivery: TeleHealth     Virtual Encounter - Pain Management PROVIDER NOTE: Information contained herein reflects review and annotations entered in association with encounter. Interpretation of such information and data should be left to medically-trained personnel. Information provided to patient can be located elsewhere in the medical record under "Patient Instructions". Document created using STT-dictation technology, any transcriptional errors that may result from process are unintentional.    Contact & Pharmacy Preferred: [phone] Home: [phone] (home) Mobile: [phone] (mobile) E-mail: [email or web address]  [id number] - [place], [place] - [address] [place] [address] Phone: [phone] Fax: [phone]  [facility] - [place], [place] - [address] [place] [address] Phone: [phone] Fax: [phone]   Pre-screening  Tara Potter offered "in-person" vs "virtual" encounter. She indicated preferring virtual for this encounter.   Reason COVID-19*  Social distancing based on CDC and AMA recommendations.   I contacted Tara Potter on 08/12/2023 via telephone.      I clearly identified myself as Edward Jolly, MD. I verified that I was speaking with the correct person using two identifiers (Name: Tara Potter, and date of birth: March 28, 1946).  Consent I sought verbal advanced consent from Tara Potter for virtual visit interactions. I informed Tara Potter of possible security and privacy concerns, risks, and limitations associated with providing "not-in-person" medical evaluation and management services.  I also informed Tara Potter of the availability of "in-person" appointments. Finally, I informed her that there would be a charge for the virtual visit and that she could be  personally, fully or partially, financially responsible for it. Tara Potter expressed understanding and agreed to proceed.   Historic Elements   Tara Potter is a 77 y.o. year old, female patient evaluated today after our last contact on 07/15/2023. Tara Potter  has a past medical history of Arthritis, Hyperlipidemia, and Hypertension. She also  has a past surgical history that includes Tubal ligation and Dilation and curettage of uterus. Tara Potter has a current medication list which includes the following prescription(s): acetaminophen, aspirin ec, atorvastatin, bisoprolol-hydrochlorothiazide, clobetasol cream, [facility] acetaminophen, diclofenac, diclofenac sodium, estradiol, famotidine, fluticasone, gabapentin, levothyroxine, loratadine, and multiple vitamins-minerals. She  reports that she has never smoked. She has never used smokeless tobacco. She reports that she does not currently use alcohol. She reports that she does not use drugs. Tara Potter is allergic to augmentin [amoxicillin-pot clavulanate], amoxicillin, entex t [pseudoephedrine-guaifenesin], and erythromycin.  BMI: Estimated body mass index is 42.09 kg/m as calculated from the following:   Height as of 07/15/23: 5\' 9"  (1.753 m).   Weight as of 07/15/23: 285 lb (129.3 kg). Last encounter: 06/25/2023. Last procedure: 07/15/2023.  HPI  Today, she is being contacted for a post-procedure assessment.   Post-procedure evaluation    Type:  Hyalgan Intra-articular Knee Injection #1  Laterality: Bilateral (-50) Level/approach: Medial Imaging guidance: None required ([id number]) Anesthesia: Local anesthesia (1-2% Lidocaine) Sedation: No Sedation                       DOS: 07/15/2023  Performed by: Edward Jolly, MD  20% pain relief after knee injections for 2 weeks   Type: Lumbar  Facet, Medial Branch Block(s) (w/ fluoroscopic mapping)          Laterality: Bilateral  Level: L3, L4, and L5 Medial Branch Level(s). Injecting these levels blocks the L3-4 and L4-5 lumbar facet joints. Imaging: Fluoroscopic guidance         Anesthesia: Local anesthesia (1-2% Lidocaine) Sedation: No Sedation                       DOS: 07/15/2023 Performed by: Edward Jolly, MD  80% pain relief for 3 weeks, pain gradually returning. States that she was more functional during this time and able to do ADLs with less pain Repeat diagnostic lumbar facet medial branch nerve block #2 and then consider RFA     Laboratory Chemistry Profile   Renal Lab Results  Component Value Date   BUN 28 (H) 05/16/2023   CREATININE 1.07 (H) 05/16/2023   BCR 26 05/16/2023   GFRAA 50 (L) 06/09/2020   GFRNONAA 43 (L) 06/09/2020    Hepatic Lab Results  Component Value Date   AST 17 05/16/2023   ALT 13 05/16/2023   ALBUMIN 4.3 05/16/2023   ALKPHOS 145 (H) 05/16/2023    Electrolytes Lab Results  Component Value Date   NA 141 05/16/2023   K 4.9 05/16/2023   CL 102 05/16/2023   CALCIUM 10.6 (H) 05/16/2023    Bone No results found for: "VD25OH", "VD125OH2TOT", "ZO1096EA5", "WU9811BJ4", "25OHVITD1", "25OHVITD2", "25OHVITD3", "TESTOFREE", "TESTOSTERONE"  Inflammation (CRP: Acute Phase) (ESR: Chronic Phase) No results found for: "CRP", "ESRSEDRATE", "LATICACIDVEN"       Note: Above Lab results reviewed.   Assessment  The primary encounter diagnosis was Lumbar spondylosis. Diagnoses of Lumbar facet arthropathy and Chronic pain syndrome were also pertinent to this visit.  Plan of Care  Repeat diagnostic lumbar facet medial branch nerve block #2 then consider lumbar radiofrequency ablation purpose of obtaining longer-term pain relief.  Orders:  Orders Placed This Encounter  Procedures   LUMBAR FACET(MEDIAL BRANCH NERVE BLOCK) MBNB    Standing Status:   Future    Expected Date:   09/09/2023     Expiration Date:   11/10/2023    Scheduling Instructions:     Procedure: Lumbar facet block (AKA.: Lumbosacral medial branch nerve block)     Side: Bilateral     Level: L3-4, L4-5, Facets ( L3, L4, L5,Medial Branch)     Sedation: Patient's choice.     Timeframe: ASAA    Where will this procedure be performed?:   [facility]   Follow-up plan:   Return in about 3 weeks (around 09/02/2023) for B/L L3, 4, 5 MBNB #2, in clinic NS.      Bilateral lumbar facets L3, L4, L5 07/15/2023, bilateral knee Hyalgan 07/15/2023    Recent Visits Date Type Provider Dept  07/15/23 Procedure visit Edward Jolly, MD [facility]  06/25/23 Office Visit Edward Jolly, MD [facility]  Showing recent visits within past 90 days and meeting all other requirements Today's Visits Date Type Provider Dept  08/12/23 Office Visit Edward Jolly, MD [facility]  Showing today's visits and meeting all other requirements Future Appointments No visits were found meeting these conditions. Showing future appointments within next 90 days and meeting all other requirements  I discussed the assessment and treatment plan with the patient. The patient was provided an opportunity to ask questions and all were answered. The  patient agreed with the plan and demonstrated an understanding of the instructions.  Patient advised to call back or seek an in-person evaluation if the symptoms or condition worsens.  Duration of encounter: 15 minutes.  Note by: Edward Jolly, MD Date: 08/12/2023; Time: 1:15 PM

## 2023-08-17 ENCOUNTER — Other Ambulatory Visit: Payer: Self-pay | Admitting: Family Medicine

## 2023-08-17 DIAGNOSIS — I10 Essential (primary) hypertension: Secondary | ICD-10-CM

## 2023-08-20 NOTE — Telephone Encounter (Signed)
 Requested Prescriptions  Pending Prescriptions Disp Refills   levothyroxine  (SYNTHROID ) 75 MCG tablet [Pharmacy Med Name: LEVOTHYROXINE  75 MCG TABLET] 90 tablet 1    Sig: TAKE 1 TABLET BY MOUTH EVERY DAY     Endocrinology:  Hypothyroid Agents Passed - 08/20/2023  2:17 PM      Passed - TSH in normal range and within 360 days    TSH  Date Value Ref Range Status  05/16/2023 3.010 0.450 - 4.500 uIU/mL Final         Passed - Valid encounter within last 12 months    Recent Outpatient Visits           3 months ago Encounter for annual physical exam   [facility] [place], Jon HERO, MD   8 months ago Urinary frequency   [place] [facility] Cyndi Shaver, PA-C   10 months ago Essential (primary) hypertension   [place] [facility] [place], Jon HERO, MD   10 months ago Cystitis   [facility] [place], Jon HERO, MD   1 year ago Essential (primary) hypertension   [place] [facility] Cyndi Shaver, PA-C       Future Appointments             In 3 months Bacigalupo, Jon HERO, MD [facility]

## 2023-09-07 ENCOUNTER — Other Ambulatory Visit: Payer: Self-pay | Admitting: Family Medicine

## 2023-09-07 DIAGNOSIS — I10 Essential (primary) hypertension: Secondary | ICD-10-CM

## 2023-09-07 DIAGNOSIS — M5432 Sciatica, left side: Secondary | ICD-10-CM

## 2023-09-09 NOTE — Telephone Encounter (Signed)
Requested Prescriptions  Pending Prescriptions Disp Refills   bisoprolol-hydrochlorothiazide (ZIAC) 5-6.25 MG tablet [Pharmacy Med Name: BISOPROLOL-HCTZ 5-6.25 MG TAB] 90 tablet 0    Sig: TAKE 1 TABLET BY MOUTH EVERY DAY     Cardiovascular: Beta Blocker + Diuretic Combos Failed - 09/09/2023  4:16 PM      Failed - Cr in normal range and within 180 days    Creatinine, Ser  Date Value Ref Range Status  05/16/2023 1.07 (H) 0.57 - 1.00 mg/dL Final         Failed - eGFR in normal range and within 180 days    GFR calc Af Amer  Date Value Ref Range Status  06/09/2020 50 (L) >59 mL/min/1.73 Final    Comment:    **In accordance with recommendations from the NKF-ASN Task force,**   [facility] is in the process of updating its eGFR calculation to the   2021 CKD-EPI creatinine equation that estimates kidney function   without a race variable.    GFR calc non Af Amer  Date Value Ref Range Status  06/09/2020 43 (L) >59 mL/min/1.73 Final   eGFR  Date Value Ref Range Status  05/16/2023 53 (L) >59 mL/min/1.73 Final         Failed - Last BP in normal range    BP Readings from Last 1 Encounters:  07/15/23 (!) 146/81         Passed - K in normal range and within 180 days    Potassium  Date Value Ref Range Status  05/16/2023 4.9 3.5 - 5.2 mmol/L Final         Passed - Na in normal range and within 180 days    Sodium  Date Value Ref Range Status  05/16/2023 141 134 - 144 mmol/L Final         Passed - Last Heart Rate in normal range    Pulse Readings from Last 1 Encounters:  07/15/23 61         Passed - Valid encounter within last 6 months    Recent Outpatient Visits           3 months ago Encounter for annual physical exam   [place] [facility] [place], Marzella Schlein, MD   9 months ago Urinary frequency   [place] [facility] Alfredia Ferguson, PA-C   10 months ago Essential (primary) hypertension   [facility]  [place], Marzella Schlein, MD   11 months ago Cystitis   [place] [facility] [place], Marzella Schlein, MD   1 year ago Essential (primary) hypertension   [place] [facility] Alfredia Ferguson, PA-C       Future Appointments             In 2 months Bacigalupo, Marzella Schlein, MD [facility]             gabapentin (NEURONTIN) 100 MG capsule [Pharmacy Med Name: GABAPENTIN 100 MG CAPSULE] 90 capsule 1    Sig: TAKE 1 CAPSULE BY MOUTH EVERYDAY AT BEDTIME     Neurology: Anticonvulsants - gabapentin Failed - 09/09/2023  4:16 PM      Failed - Cr in normal range and within 360 days    Creatinine, Ser  Date Value Ref Range Status  05/16/2023 1.07 (H) 0.57 - 1.00 mg/dL Final         Passed - Completed PHQ-2 or PHQ-9 in the last 360 days  Passed - Valid encounter within last 12 months    Recent Outpatient Visits           3 months ago Encounter for annual physical exam   [facility] [place], Marzella Schlein, MD   9 months ago Urinary frequency   [place] [facility] Alfredia Ferguson, PA-C   10 months ago Essential (primary) hypertension   [facility] [place], Marzella Schlein, MD   11 months ago Cystitis   [facility] [place], Marzella Schlein, MD   1 year ago Essential (primary) hypertension    [facility] Alfredia Ferguson, PA-C       Future Appointments             In 2 months Bacigalupo, Marzella Schlein, MD [facility], [facility]

## 2023-11-13 ENCOUNTER — Other Ambulatory Visit: Payer: Self-pay | Admitting: Family Medicine

## 2023-11-14 NOTE — Telephone Encounter (Signed)
 Requested medication (s) are due for refill today: Yes  Requested medication (s) are on the active medication list: Yes  Last refill:  05/16/23 #180,1 refill  Future visit scheduled: Yes  Notes to clinic:  Manual review required      Requested Prescriptions  Pending Prescriptions Disp Refills   diclofenac (VOLTAREN) 75 MG EC tablet [Pharmacy Med Name: DICLOFENAC SOD EC 75 MG TAB] 180 tablet 1    Sig: TAKE 1 TABLET BY MOUTH TWICE A DAY     Analgesics:  NSAIDS Failed - 11/14/2023  1:31 PM      Failed - Manual Review: Labs are only required if the patient has taken medication for more than 8 weeks.      Failed - Cr in normal range and within 360 days    Creatinine, Ser  Date Value Ref Range Status  05/16/2023 1.07 (H) 0.57 - 1.00 mg/dL Final         Failed - HGB in normal range and within 360 days    Hemoglobin  Date Value Ref Range Status  03/06/2022 13.0 11.1 - 15.9 g/dL Final         Failed - PLT in normal range and within 360 days    Platelets  Date Value Ref Range Status  03/06/2022 259 150 - 450 x10E3/uL Final         Failed - HCT in normal range and within 360 days    Hematocrit  Date Value Ref Range Status  03/06/2022 38.1 34.0 - 46.6 % Final         Failed - Valid encounter within last 12 months    Recent Outpatient Visits   None     Future Appointments             In 2 weeks Bacigalupo, Marzella Schlein, MD [facility]            Passed - eGFR is 30 or above and within 360 days    GFR calc Af Denyse Dago  Date Value Ref Range Status  06/09/2020 50 (L) >59 mL/min/1.73 Final    Comment:    **In accordance with recommendations from the NKF-ASN Task force,**   [facility] is in the process of updating its eGFR calculation to the   2021 CKD-EPI creatinine equation that estimates kidney function   without a race variable.    GFR calc non Af Amer  Date Value Ref Range Status  06/09/2020 43 (L) >59 mL/min/1.73 Final   eGFR  Date  Value Ref Range Status  05/16/2023 53 (L) >59 mL/min/1.73 Final         Passed - Patient is not pregnant

## 2023-11-26 ENCOUNTER — Ambulatory Visit: Payer: Medicare PPO | Admitting: Family Medicine

## 2023-12-03 ENCOUNTER — Encounter: Payer: Self-pay | Admitting: Family Medicine

## 2023-12-03 ENCOUNTER — Ambulatory Visit: Payer: Self-pay | Admitting: Family Medicine

## 2023-12-03 VITALS — BP 114/46 | HR 64 | Ht 69.0 in | Wt 299.0 lb

## 2023-12-03 DIAGNOSIS — Z832 Family history of diseases of the blood and blood-forming organs and certain disorders involving the immune mechanism: Secondary | ICD-10-CM

## 2023-12-03 DIAGNOSIS — I10 Essential (primary) hypertension: Secondary | ICD-10-CM | POA: Diagnosis not present

## 2023-12-03 DIAGNOSIS — J301 Allergic rhinitis due to pollen: Secondary | ICD-10-CM

## 2023-12-03 DIAGNOSIS — E7800 Pure hypercholesterolemia, unspecified: Secondary | ICD-10-CM

## 2023-12-03 DIAGNOSIS — N1831 Chronic kidney disease, stage 3a: Secondary | ICD-10-CM

## 2023-12-03 DIAGNOSIS — E039 Hypothyroidism, unspecified: Secondary | ICD-10-CM | POA: Diagnosis not present

## 2023-12-03 DIAGNOSIS — R7303 Prediabetes: Secondary | ICD-10-CM

## 2023-12-03 NOTE — Assessment & Plan Note (Signed)
 Well-managed on current dose of Synthroid  with no new symptoms. - Order labs to assess thyroid  function.

## 2023-12-03 NOTE — Assessment & Plan Note (Signed)
 CKD 3A is stable with no acute issues. - Order labs to assess kidney function.

## 2023-12-03 NOTE — Assessment & Plan Note (Signed)
 Blood pressure is well-controlled. No symptoms of dizziness or lightheadedness despite a slightly low diastolic reading, likely due to inadequate hydration or nutrition. - Continue current antihypertensive regimen including Ziac  (bisoprolol  and HCTZ). - Encourage adequate hydration and regular meals to prevent low blood pressure readings.

## 2023-12-03 NOTE — Assessment & Plan Note (Signed)
 Previously well controlled Continue statin Repeat FLP and CMP

## 2023-12-03 NOTE — Assessment & Plan Note (Signed)
 Recommend low carb diet Recheck A1c

## 2023-12-03 NOTE — Progress Notes (Signed)
 Established patient visit   Patient: Tara Potter   DOB: [birth year]   78 y.o. Female  MRN: [id number] Visit Date: 12/03/2023  Today's healthcare provider: Aden Agreste, MD   Chief Complaint  Patient presents with   Medical Management of Chronic Issues   Hyperlipidemia   Hypertension   Hypothyroidism   Cough    Pt reports hacking first thing in the morning and will have mucus with no color. Would like provider to check and make sure it is nothing as she believes it is due to seasonal allergies. No fever or any other symptoms associated    Subjective    Hyperlipidemia  Hypertension  Cough   HPI     Cough    Additional comments: Pt reports hacking first thing in the morning and will have mucus with no color. Would like provider to check and make sure it is nothing as she believes it is due to seasonal allergies. No fever or any other symptoms associated       Last edited by Pasty Bongo, CMA on 12/03/2023  3:39 PM.       Discussed the use of AI scribe software for clinical note transcription with the patient, who gave verbal consent to proceed.  History of Present Illness   Tara Potter, a 78 year old patient with a history of hypertension, hypothyroidism, prediabetes, hyperlipidemia, and CKD 3A, presents for a routine follow-up. The patient reports discomfort due to allergies, describing symptoms of postnasal drip and a scratchy throat. She has been using Flonase  and Chloraseptic to manage these symptoms.  The patient also expresses dissatisfaction with her current hearing aids, stating that they cause her ears to itch and do not improve her hearing. She is considering purchasing a cheaper pair of hearing aids advertised online.  Additionally, the patient mentions concerns about her blood platelet levels due to her sister's recent health issues. She requests that her platelet levels be checked during her lab tests today.  The patient also discusses her struggles with  mobility due to issues with her knees. She has seen a doctor for her back and knee issues, but the treatments have not been entirely successful. She is considering seeking treatment at an [facility] she saw advertised on television.         Medications: Outpatient Medications Prior to Visit  Medication Sig   acetaminophen (TYLENOL) 650 MG CR tablet Take 650 mg by mouth in the morning and at bedtime.   aspirin EC 81 MG tablet Take 81 mg by mouth every other day.   atorvastatin  (LIPITOR) 10 MG tablet TAKE 1 TABLET BY MOUTH EVERY DAY   bisoprolol -hydrochlorothiazide  (ZIAC ) 5-6.25 MG tablet TAKE 1 TABLET BY MOUTH EVERY DAY   clobetasol  cream (TEMOVATE ) 0.05 % Apply 1 application. topically 2 (two) times daily.   [facility] ACETAMINOPHEN 325 MG CAPS    diclofenac  (VOLTAREN ) 75 MG EC tablet TAKE 1 TABLET BY MOUTH TWICE A DAY   diclofenac  Sodium (VOLTAREN ) 1 % GEL Apply topically daily.   estradiol  (ESTRACE  VAGINAL) 0.1 MG/GM vaginal cream Place 1 Applicatorful vaginally 3 (three) times a week.   famotidine  (PEPCID ) 20 MG tablet Take 1 tablet by mouth twice daily (Patient taking differently: as needed.)   fluticasone  (FLONASE ) 50 MCG/ACT nasal spray Use 2 spray(s) in each nostril once daily   gabapentin  (NEURONTIN ) 100 MG capsule TAKE 1 CAPSULE BY MOUTH EVERYDAY AT BEDTIME   levothyroxine  (SYNTHROID ) 75 MCG tablet TAKE 1 TABLET BY MOUTH EVERY DAY  loratadine (CLARITIN) 10 MG tablet Take 10 mg by mouth daily as needed for allergies.   Multiple Vitamins-Minerals (MULTIVITAL PO) Take by mouth daily. When remembers   No facility-administered medications prior to visit.    Review of Systems  Respiratory:  Positive for cough.        Objective    BP (!) 114/46 (BP Location: Right Arm, Patient Position: Sitting, Cuff Size: Large)   Pulse 64   Ht 5\' 9"  (1.753 m)   Wt 299 lb (135.6 kg)   SpO2 98%   BMI 44.15 kg/m    Physical Exam Vitals reviewed.  Constitutional:      General: She is  not in acute distress.    Appearance: Normal appearance. She is well-developed. She is not diaphoretic.  HENT:     Head: Normocephalic and atraumatic.  Eyes:     General: No scleral icterus.    Conjunctiva/sclera: Conjunctivae normal.  Neck:     Thyroid : No thyromegaly.  Cardiovascular:     Rate and Rhythm: Normal rate and regular rhythm.     Heart sounds: Normal heart sounds. No murmur heard. Pulmonary:     Effort: Pulmonary effort is normal. No respiratory distress.     Breath sounds: Normal breath sounds. No wheezing, rhonchi or rales.  Musculoskeletal:     Cervical back: Neck supple.     Right lower leg: No edema.     Left lower leg: No edema.  Lymphadenopathy:     Cervical: No cervical adenopathy.  Skin:    General: Skin is warm and dry.     Findings: No rash.  Neurological:     Mental Status: She is alert and oriented to person, place, and time. Mental status is at baseline.  Psychiatric:        Mood and Affect: Mood normal.        Behavior: Behavior normal.      No results found for any visits on 12/03/23.  Assessment & Plan     Problem List Items Addressed This Visit       Cardiovascular and Mediastinum   Essential (primary) hypertension - Primary   Blood pressure is well-controlled. No symptoms of dizziness or lightheadedness despite a slightly low diastolic reading, likely due to inadequate hydration or nutrition. - Continue current antihypertensive regimen including Ziac  (bisoprolol  and HCTZ). - Encourage adequate hydration and regular meals to prevent low blood pressure readings.      Relevant Orders   Comprehensive metabolic panel with GFR     Endocrine   Adult hypothyroidism   Well-managed on current dose of Synthroid  with no new symptoms. - Order labs to assess thyroid  function.      Relevant Orders   TSH     Genitourinary   Stage 3a chronic kidney disease (HCC)   CKD 3A is stable with no acute issues. - Order labs to assess kidney  function.      Relevant Orders   Comprehensive metabolic panel with GFR     Other   HLD (hyperlipidemia)   Previously well controlled Continue statin Repeat FLP and CMP       Relevant Orders   Comprehensive metabolic panel with GFR   Lipid panel   Prediabetes   Recommend low carb diet Recheck A1c       Relevant Orders   Hemoglobin A1c   Morbid obesity (HCC)   Discussed importance of healthy weight management Discussed diet and exercise       Other Visit Diagnoses  Family history of thrombocytopenia       Relevant Orders   CBC w/Diff/Platelet     Seasonal allergic rhinitis due to pollen               Allergic Rhinitis Seasonal allergic rhinitis with post-nasal drip and scratchy throat, likely exacerbated by pollen. Symptoms are most pronounced in the morning. - Continue using Flonase  nasal spray. - Encourage use of Chloraseptic for throat discomfort. - Advise to continue allergy medications as needed.  Hearing Aid Discomfort Discomfort and itching with current hearing aids, possibly due to material sensitivity. Previous adjustments by audiologist have not resolved the issue. - Consider trying alternative hearing aids, such as over-the-counter options, as a cost-effective trial.  Blood Platelet Level Monitoring Concern about blood platelet levels due to family history. Previous levels were normal two years ago. - Order blood tests to check platelet levels.       Return in about 6 months (around 06/03/2024) for CPE.       Aden Agreste, MD  [facility] [phone] (phone) [phone] (fax)  [facility]

## 2023-12-03 NOTE — Assessment & Plan Note (Signed)
 Discussed importance of healthy weight management Discussed diet and exercise

## 2023-12-04 LAB — COMPREHENSIVE METABOLIC PANEL WITH GFR
ALT: 17 IU/L (ref 0–32)
AST: 16 IU/L (ref 0–40)
Albumin: 4.2 g/dL (ref 3.8–4.8)
Alkaline Phosphatase: 156 IU/L — ABNORMAL HIGH (ref 44–121)
BUN/Creatinine Ratio: 31 — ABNORMAL HIGH (ref 12–28)
BUN: 26 mg/dL (ref 8–27)
Bilirubin Total: 0.3 mg/dL (ref 0.0–1.2)
CO2: 28 mmol/L (ref 20–29)
Calcium: 10.4 mg/dL — ABNORMAL HIGH (ref 8.7–10.3)
Chloride: 101 mmol/L (ref 96–106)
Creatinine, Ser: 0.85 mg/dL (ref 0.57–1.00)
Globulin, Total: 2.6 g/dL (ref 1.5–4.5)
Glucose: 87 mg/dL (ref 70–99)
Potassium: 4.8 mmol/L (ref 3.5–5.2)
Sodium: 141 mmol/L (ref 134–144)
Total Protein: 6.8 g/dL (ref 6.0–8.5)
eGFR: 70 mL/min/{1.73_m2} (ref >59)

## 2023-12-04 LAB — CBC WITH DIFFERENTIAL/PLATELET
Basophils Absolute: 0.1 10*3/uL (ref 0.0–0.2)
Basos: 1 %
EOS (ABSOLUTE): 0.2 10*3/uL (ref 0.0–0.4)
Eos: 2 %
Hematocrit: 41 % (ref 34.0–46.6)
Hemoglobin: 13.6 g/dL (ref 11.1–15.9)
Immature Grans (Abs): 0 10*3/uL (ref 0.0–0.1)
Immature Granulocytes: 0 %
Lymphocytes Absolute: 2.9 10*3/uL (ref 0.7–3.1)
Lymphs: 29 %
MCH: 29.6 pg (ref 26.6–33.0)
MCHC: 33.2 g/dL (ref 31.5–35.7)
MCV: 89 fL (ref 79–97)
Monocytes Absolute: 0.9 10*3/uL (ref 0.1–0.9)
Monocytes: 9 %
Neutrophils Absolute: 5.7 10*3/uL (ref 1.4–7.0)
Neutrophils: 59 %
Platelets: 277 10*3/uL (ref 150–450)
RBC: 4.59 x10E6/uL (ref 3.77–5.28)
RDW: 11.8 % (ref 11.7–15.4)
WBC: 9.8 10*3/uL (ref 3.4–10.8)

## 2023-12-04 LAB — LIPID PANEL
Chol/HDL Ratio: 3.9 ratio (ref 0.0–4.4)
Cholesterol, Total: 169 mg/dL (ref 100–199)
HDL: 43 mg/dL (ref >39)
LDL Chol Calc (NIH): 95 mg/dL (ref 0–99)
Triglycerides: 177 mg/dL — ABNORMAL HIGH (ref 0–149)
VLDL Cholesterol Cal: 31 mg/dL (ref 5–40)

## 2023-12-04 LAB — HEMOGLOBIN A1C
Est. average glucose Bld gHb Est-mCnc: 111 mg/dL
Hgb A1c MFr Bld: 5.5 % (ref 4.8–5.6)

## 2023-12-04 LAB — TSH: TSH: 2.16 u[IU]/mL (ref 0.450–4.500)

## 2023-12-05 ENCOUNTER — Encounter: Payer: Self-pay | Admitting: Family Medicine

## 2023-12-10 LAB — SPECIMEN STATUS REPORT

## 2023-12-10 LAB — VITAMIN D 25 HYDROXY (VIT D DEFICIENCY, FRACTURES): Vit D, 25-Hydroxy: 17.7 ng/mL — ABNORMAL LOW (ref 30.0–100.0)

## 2023-12-14 ENCOUNTER — Other Ambulatory Visit: Payer: Self-pay | Admitting: Family Medicine

## 2023-12-14 DIAGNOSIS — I10 Essential (primary) hypertension: Secondary | ICD-10-CM

## 2023-12-19 ENCOUNTER — Ambulatory Visit: Payer: Self-pay

## 2023-12-19 ENCOUNTER — Telehealth: Admitting: Physician Assistant

## 2023-12-19 DIAGNOSIS — M51362 Other intervertebral disc degeneration, lumbar region with discogenic back pain and lower extremity pain: Secondary | ICD-10-CM

## 2023-12-19 DIAGNOSIS — M5431 Sciatica, right side: Secondary | ICD-10-CM | POA: Diagnosis not present

## 2023-12-19 DIAGNOSIS — M47816 Spondylosis without myelopathy or radiculopathy, lumbar region: Secondary | ICD-10-CM

## 2023-12-19 DIAGNOSIS — M25551 Pain in right hip: Secondary | ICD-10-CM | POA: Diagnosis not present

## 2023-12-19 MED ORDER — PREDNISONE 10 MG (21) PO TBPK: ORAL_TABLET | ORAL | 0 refills | Status: DC

## 2023-12-19 NOTE — Progress Notes (Signed)
 Virtual Visit Consent   Tara Potter, you are scheduled for a virtual visit with a [facility] provider today. Just as with appointments in the office, your consent must be obtained to participate. Your consent will be active for this visit and any virtual visit you may have with one of our providers in the next 365 days. If you have a MyChart account, a copy of this consent can be sent to you electronically.  As this is a virtual visit, video technology does not allow for your provider to perform a traditional examination. This may limit your provider's ability to fully assess your condition. If your provider identifies any concerns that need to be evaluated in person or the need to arrange testing (such as labs, EKG, etc.), we will make arrangements to do so. Although advances in technology are sophisticated, we cannot ensure that it will always work on either your end or our end. If the connection with a video visit is poor, the visit may have to be switched to a telephone visit. With either a video or telephone visit, we are not always able to ensure that we have a secure connection.  By engaging in this virtual visit, you consent to the provision of healthcare and authorize for your insurance to be billed (if applicable) for the services provided during this visit. Depending on your insurance coverage, you may receive a charge related to this service.  I need to obtain your verbal consent now. Are you willing to proceed with your visit today? Tara Potter has provided verbal consent on 12/19/2023 for a virtual visit (video or telephone). Tara Kelp, PA-C  Date: 12/19/2023 1:29 PM   Virtual Visit via Video Note   I, Tara Kelp, connected with  Tara Potter  ([id number], 02/12/1946) on 12/19/23 at 12:00 PM EDT by a video-enabled telemedicine application and verified that I am speaking with the correct person using two identifiers.  Location: Patient: Virtual Visit Location Patient:  Home Provider: Virtual Visit Location Provider: Home Office   I discussed the limitations of evaluation and management by telemedicine and the availability of in person appointments. The patient expressed understanding and agreed to proceed.    History of Present Illness: Tara Potter is a 78 y.o. who identifies as a female who was assigned female at birth, and is being seen today for right leg pain and hip pain.  HPI: Back Pain This is a chronic problem. The current episode started in the past 7 days (4-5 days ago flared). The pain is present in the lumbar spine and gluteal. The pain radiates to the right thigh and right knee. The pain is moderate. The pain is The same all the time. The symptoms are aggravated by position and sitting. Associated symptoms include weakness. Pertinent negatives include no abdominal pain, bladder incontinence, bowel incontinence, headaches, numbness, paresis, paresthesias or perianal numbness. (Denies rash) Risk factors include obesity and sedentary lifestyle. She has tried heat and bed rest for the symptoms. The treatment provided mild relief.     Problems:  Patient Active Problem List   Diagnosis Date Noted   Stage 3a chronic kidney disease (HCC) 05/16/2023   OAB (overactive bladder) 05/16/2023   Atrophic vaginitis 04/06/2021   Lumbar degenerative disc disease 02/02/2020   Chronic pain of both knees 02/02/2020   Sacroiliac joint pain 02/02/2020   Bilateral hip pain 02/02/2020   Chronic pain syndrome 02/02/2020   Bilateral primary osteoarthritis of knee 12/10/2019   Frequency  of urination 12/10/2019   Sciatica of left side 11/19/2018   Lumbar facet arthropathy 07/15/2017   Bursitis of right shoulder 04/11/2017   Morbid obesity (HCC) 04/11/2017   Prediabetes 03/14/2016   Arthritis, degenerative 09/29/2008   Essential (primary) hypertension 02/25/2008   Cardiac murmur 01/29/2008   HLD (hyperlipidemia) 08/13/1998   Adult hypothyroidism 08/13/1998   Hay  fever 08/13/1998    Allergies:  Allergies  Allergen Reactions   Augmentin [Amoxicillin-Pot Clavulanate] Nausea Only and Other (See Comments)    hallucinates   Amoxicillin Diarrhea    Augmentin   Entex T [Pseudoephedrine-Guaifenesin]     keeps awake; pt states she still takes this   Erythromycin Diarrhea   Medications:  Current Outpatient Medications:    predniSONE  (STERAPRED UNI-PAK 21 TAB) 10 MG (21) TBPK tablet, 6 day taper; take as directed on package instructions, Disp: 21 tablet, Rfl: 0   acetaminophen (TYLENOL) 650 MG CR tablet, Take 650 mg by mouth in the morning and at bedtime., Disp: , Rfl:    aspirin EC 81 MG tablet, Take 81 mg by mouth every other day., Disp: , Rfl:    atorvastatin  (LIPITOR) 10 MG tablet, TAKE 1 TABLET BY MOUTH EVERY DAY, Disp: 90 tablet, Rfl: 3   bisoprolol -hydrochlorothiazide  (ZIAC ) 5-6.25 MG tablet, TAKE 1 TABLET BY MOUTH EVERY DAY, Disp: 90 tablet, Rfl: 0   clobetasol  cream (TEMOVATE ) 0.05 %, Apply 1 application. topically 2 (two) times daily., Disp: 30 g, Rfl: 2   [employer] ACETAMINOPHEN 325 MG CAPS, , Disp: , Rfl:    diclofenac  (VOLTAREN ) 75 MG EC tablet, TAKE 1 TABLET BY MOUTH TWICE A DAY, Disp: 180 tablet, Rfl: 1   diclofenac  Sodium (VOLTAREN ) 1 % GEL, Apply topically daily., Disp: , Rfl:    estradiol  (ESTRACE  VAGINAL) 0.1 MG/GM vaginal cream, Place 1 Applicatorful vaginally 3 (three) times a week., Disp: 42.5 g, Rfl: 12   famotidine  (PEPCID ) 20 MG tablet, Take 1 tablet by mouth twice daily (Patient taking differently: as needed.), Disp: 180 tablet, Rfl: 0   fluticasone  (FLONASE ) 50 MCG/ACT nasal spray, Use 2 spray(s) in each nostril once daily, Disp: 48 g, Rfl: 0   gabapentin  (NEURONTIN ) 100 MG capsule, TAKE 1 CAPSULE BY MOUTH EVERYDAY AT BEDTIME, Disp: 90 capsule, Rfl: 1   levothyroxine  (SYNTHROID ) 75 MCG tablet, TAKE 1 TABLET BY MOUTH EVERY DAY, Disp: 90 tablet, Rfl: 1   loratadine (CLARITIN) 10 MG tablet, Take 10 mg by mouth daily as needed for  allergies., Disp: , Rfl:    Multiple Vitamins-Minerals (MULTIVITAL PO), Take by mouth daily. When remembers, Disp: , Rfl:   Observations/Objective: Patient is well-developed, well-nourished in no acute distress.  Resting comfortably at home.  Head is normocephalic, atraumatic.  No labored breathing.  Speech is clear and coherent with logical content.  Patient is alert and oriented at baseline.    Assessment and Plan: 1. Degeneration of intervertebral disc of lumbar region with discogenic back pain and lower extremity pain (Primary) - predniSONE  (STERAPRED UNI-PAK 21 TAB) 10 MG (21) TBPK tablet; 6 day taper; take as directed on package instructions  Dispense: 21 tablet; Refill: 0  2. Lumbar facet arthropathy - predniSONE  (STERAPRED UNI-PAK 21 TAB) 10 MG (21) TBPK tablet; 6 day taper; take as directed on package instructions  Dispense: 21 tablet; Refill: 0  3. Sciatica, right side - predniSONE  (STERAPRED UNI-PAK 21 TAB) 10 MG (21) TBPK tablet; 6 day taper; take as directed on package instructions  Dispense: 21 tablet; Refill: 0  4. Right  hip pain  - Acute on chronic issue, not flared bad in over 4 years - Add Prednisone  - Continue Gabapentin  as prescribed - Heat or warm compress to the area - Follow up if not improving or worsening  Follow Up Instructions: I discussed the assessment and treatment plan with the patient. The patient was provided an opportunity to ask questions and all were answered. The patient agreed with the plan and demonstrated an understanding of the instructions.  A copy of instructions were sent to the patient via MyChart unless otherwise noted below.    The patient was advised to call back or seek an in-person evaluation if the symptoms worsen or if the condition fails to improve as anticipated.    Tara Kelp, PA-C

## 2023-12-19 NOTE — Telephone Encounter (Signed)
 Copied from CRM [id number]. Topic: Clinical - Red Word Triage >> Dec 19, 2023 11:02 AM [place] B wrote: [facility] that prompted transfer to Nurse Triage: pt called in has severe pain in right leg   Chief Complaint: Leg pain  Symptoms: Right leg/lower back pain  Frequency: Constant  Disposition: [] ED /[x] Urgent Care (no appt availability in office) / [] Appointment(In office/virtual)/ []  [facility]/ [] Home Care/ [] Refused Recommended Disposition /[] [facility]/ []  Follow-up with PCP Additional Notes: Patient reports that she has been experiencing right lower back/leg pain for the last 4-5 days. She states her pain starts in her right hip and radiates to her buttocks and down her right leg. She denies any other symptoms with her pain. Virtual urgent care appointment scheduled due to no availability in the office.    Reason for Disposition  [1] MODERATE pain (e.g., interferes with normal activities, limping) AND [2] present > 3 days  Answer Assessment - Initial Assessment Questions 1. ONSET: "When did the pain start?"      4-5 days  2. LOCATION: "Where is the pain located?"      From right sided hip to buttocks and down leg  3. PAIN: "How bad is the pain?"    (Scale 1-10; or mild, moderate, severe)   -  MILD (1-3): doesn't interfere with normal activities    -  MODERATE (4-7): interferes with normal activities (e.g., work or school) or awakens from sleep, limping    -  SEVERE (8-10): excruciating pain, unable to do any normal activities, unable to walk     Mild to moderate  4. WORK OR EXERCISE: "Has there been any recent work or exercise that involved this part of the body?"      No 5. CAUSE: "What do you think is causing the leg pain?"     Unsure  6. OTHER SYMPTOMS: "Do you have any other symptoms?" (e.g., chest pain, back pain, breathing difficulty, swelling, rash, fever, numbness, weakness)     No  Protocols used: Leg Pain-A-AH

## 2023-12-19 NOTE — Patient Instructions (Signed)
 Tara Potter, thank you for joining Tara Kelp, PA-C for today's virtual visit.  While this provider is not your primary care provider (PCP), if your PCP is located in our provider database this encounter information will be shared with them immediately following your visit.   A [facility] MyChart account gives you access to today's visit and all your visits, tests, and labs performed at [facility] " click here if you don't have a Tara Potter MyChart account or go to [email or web address]  Consent: (Patient) Tara Potter provided verbal consent for this virtual visit at the beginning of the encounter.  Current Medications:  Current Outpatient Medications:    predniSONE  (STERAPRED UNI-PAK 21 TAB) 10 MG (21) TBPK tablet, 6 day taper; take as directed on package instructions, Disp: 21 tablet, Rfl: 0   acetaminophen (TYLENOL) 650 MG CR tablet, Take 650 mg by mouth in the morning and at bedtime., Disp: , Rfl:    aspirin EC 81 MG tablet, Take 81 mg by mouth every other day., Disp: , Rfl:    atorvastatin  (LIPITOR) 10 MG tablet, TAKE 1 TABLET BY MOUTH EVERY DAY, Disp: 90 tablet, Rfl: 3   bisoprolol -hydrochlorothiazide  (ZIAC ) 5-6.25 MG tablet, TAKE 1 TABLET BY MOUTH EVERY DAY, Disp: 90 tablet, Rfl: 0   clobetasol  cream (TEMOVATE ) 0.05 %, Apply 1 application. topically 2 (two) times daily., Disp: 30 g, Rfl: 2   [employer]S ACETAMINOPHEN 325 MG CAPS, , Disp: , Rfl:    diclofenac  (VOLTAREN ) 75 MG EC tablet, TAKE 1 TABLET BY MOUTH TWICE A DAY, Disp: 180 tablet, Rfl: 1   diclofenac  Sodium (VOLTAREN ) 1 % GEL, Apply topically daily., Disp: , Rfl:    estradiol  (ESTRACE  VAGINAL) 0.1 MG/GM vaginal cream, Place 1 Applicatorful vaginally 3 (three) times a week., Disp: 42.5 g, Rfl: 12   famotidine  (PEPCID ) 20 MG tablet, Take 1 tablet by mouth twice daily (Patient taking differently: as needed.), Disp: 180 tablet, Rfl: 0   fluticasone  (FLONASE ) 50 MCG/ACT nasal spray, Use 2 spray(s) in each  nostril once daily, Disp: 48 g, Rfl: 0   gabapentin  (NEURONTIN ) 100 MG capsule, TAKE 1 CAPSULE BY MOUTH EVERYDAY AT BEDTIME, Disp: 90 capsule, Rfl: 1   levothyroxine  (SYNTHROID ) 75 MCG tablet, TAKE 1 TABLET BY MOUTH EVERY DAY, Disp: 90 tablet, Rfl: 1   loratadine (CLARITIN) 10 MG tablet, Take 10 mg by mouth daily as needed for allergies., Disp: , Rfl:    Multiple Vitamins-Minerals (MULTIVITAL PO), Take by mouth daily. When remembers, Disp: , Rfl:    Medications ordered in this encounter:  Meds ordered this encounter  Medications   predniSONE  (STERAPRED UNI-PAK 21 TAB) 10 MG (21) TBPK tablet    Sig: 6 day taper; take as directed on package instructions    Dispense:  21 tablet    Refill:  0    Supervising Provider:   Corine Dice [[id number]]     *If you need refills on other medications prior to your next appointment, please contact your pharmacy*  Follow-Up: Call back or seek an in-person evaluation if the symptoms worsen or if the condition fails to improve as anticipated.  [facility] [phone]  Other Instructions Hip Pain The hip is the joint between the upper legs and the lower pelvis. The bones, cartilage, tendons, and muscles of your hip joint support your body and allow you to move around. Hip pain can range from a minor ache to severe pain in one or both of your hips.  The pain may be felt on the inside of the hip joint near the groin, or on the outside near the buttocks and upper thigh. You may also have swelling or stiffness in your hip area. Follow these instructions at home: Managing pain, stiffness, and swelling     If told, put ice on the painful area. Put ice in a plastic bag. Place a towel between your skin and the bag. Leave the ice on for 20 minutes, 2-3 times a day. If told, apply heat to the affected area as often as told by your health care provider. Use the heat source that your provider recommends, such as a moist heat pack or a heating  pad. Place a towel between your skin and the heat source. Leave the heat on for 20-30 minutes. If your skin turns bright red, remove the ice or heat right away to prevent skin damage. The risk of damage is higher if you cannot feel pain, heat, or cold. Activity Do exercises as told by your provider. Avoid activities that cause pain. General instructions  Take over-the-counter and prescription medicines only as told by your provider. Keep a journal of your symptoms. Write down: How often you have hip pain. The location of your pain. What the pain feels like. What makes the pain worse. Sleep with a pillow between your legs on your most comfortable side. Keep all follow-up visits. Your provider will monitor your pain and activity. Contact a health care provider if: You cannot put weight on your leg. Your pain or swelling gets worse after a week. It gets harder to walk. You have a fever. Get help right away if: You fall. You have a sudden increase in pain and swelling in your hip. Your hip is red or swollen or very tender to touch. This information is not intended to replace advice given to you by your health care provider. Make sure you discuss any questions you have with your health care provider. Document Revised: 04/03/2022 Document Reviewed: 04/03/2022 Elsevier Patient Education  2024 Elsevier Inc.   If you have been instructed to have an in-person evaluation today at a local Urgent Care facility, please use the link below. It will take you to a list of all of our available [facility], including address, phone number and hours of operation. Please do not delay care.  [facility]  If you or a family member do not have a primary care provider, use the link below to schedule a visit and establish care. When you choose a [facility] primary care physician or advanced practice provider, you gain a long-term partner in health. Find a Primary Care  Provider  Learn more about [facility] in-office and virtual care options: [facility] - Get Care Now

## 2023-12-19 NOTE — Telephone Encounter (Signed)
 Noted.

## 2023-12-19 NOTE — Telephone Encounter (Signed)
  Chief Complaint: leg pain Symptoms: pain Frequency: several days Pertinent Negatives: Patient denies redness, swelling, shortness of breath, chest pain Disposition: [] ED /[] Urgent Care (no appt availability in office) / [x] Appointment(In office/virtual)/ []  [facility]/ [] Home Care/ [x] Refused Recommended Disposition /[] [facility]/ []  Follow-up with PCP Additional Notes:  Had virtual visit today, she did not feel comfortable with the assessments, felt the person was trying to sell her things and not addressing her acute pain.  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 Right leg pain, near hip is 5-6/10 at rest, 7-8/10 with ambulation. Has been treated in the past with prednisone  and this helps the pain. She is unwilling for another appointment as she is in pain and not wanting to drive or go out. She is requesting Dr. Bacigalupo to send in prescription for prednisone  to [facility].   Copied from CRM [id number]. Topic: Clinical - Red Word Triage >> Dec 19, 2023 12:53 PM Rachelle R wrote: [facility] that prompted transfer to Nurse Triage: Patient states she is having a sciatica flare-up. States she is in a lot of pain if she tries to walk, move, or do anything.  Had a virtual urgent care visit but said she talk to a home pro lady for 45 minutes that had nothing to do with her issue. Reason for Disposition  [1] MODERATE pain (e.g., interferes with normal activities, limping) AND [2] present > 3 days  Protocols used: Leg Pain-A-AH

## 2024-03-13 ENCOUNTER — Other Ambulatory Visit: Payer: Self-pay | Admitting: Family Medicine

## 2024-03-13 DIAGNOSIS — M5432 Sciatica, left side: Secondary | ICD-10-CM

## 2024-03-18 ENCOUNTER — Other Ambulatory Visit: Payer: Self-pay | Admitting: Family Medicine

## 2024-03-18 DIAGNOSIS — I10 Essential (primary) hypertension: Secondary | ICD-10-CM

## 2024-04-16 ENCOUNTER — Telehealth (INDEPENDENT_AMBULATORY_CARE_PROVIDER_SITE_OTHER): Admitting: Family Medicine

## 2024-04-16 ENCOUNTER — Encounter: Payer: Self-pay | Admitting: Family Medicine

## 2024-04-16 ENCOUNTER — Ambulatory Visit: Payer: Self-pay

## 2024-04-16 DIAGNOSIS — Z91199 Patient's noncompliance with other medical treatment and regimen due to unspecified reason: Secondary | ICD-10-CM

## 2024-04-16 NOTE — Telephone Encounter (Signed)
 Patient call note and symptoms reviewed. Agree with scheduled appt. Will evaluate during OV

## 2024-04-16 NOTE — Progress Notes (Signed)
 Patient was unable to connect for virtual visit scheduled for 2PM on 9.4.25

## 2024-04-16 NOTE — Telephone Encounter (Signed)
 FYI Only or Action Required?: Action required by provider: request for appointment.  Patient was last seen in primary care on 12/03/2023 by Myrla Jon HERO, MD.  Called Nurse Triage reporting urinary symptoms.  Symptoms began several days ago.  Interventions attempted: OTC medications: AZO.  Symptoms are: unchanged. Has frequency, burning, pressure. Requests VV due to transportation issues.   Triage Disposition: See Physician Within 24 Hours  Patient/caregiver understands and will follow disposition?: Yes   Copied from CRM [id number]. Topic: Clinical - Red Word Triage >> Apr 16, 2024  9:09 AM Willma SAUNDERS wrote: Red Word that prompted transfer to Nurse Triage: Patient thinks she has a UTI, states since Monday night she has had an increase in frequency and burning when urinating. Reason for Disposition  Urinating more frequently than usual (i.e., frequency) OR new-onset of the feeling of an urgent need to urinate (i.e., urgency)  Answer Assessment - Initial Assessment Questions 1. SYMPTOM: What's the main symptom you're concerned about? (e.g., frequency, incontinence)     Frequency, burning 2. ONSET: When did the    start?     Monday 3. PAIN: Is there any pain? If Yes, ask: How bad is it? (Scale: 1-10; mild, moderate, severe)     MILD 4. CAUSE: What do you think is causing the symptoms?     UTI 5. OTHER SYMPTOMS: Do you have any other symptoms? (e.g., blood in urine, fever, flank pain, pain with urination)     pressure 6. PREGNANCY: Is there any chance you are pregnant? When was your last menstrual period?     no  Protocols used: Urinary Symptoms-A-AH

## 2024-04-17 ENCOUNTER — Telehealth: Payer: Self-pay

## 2024-04-17 ENCOUNTER — Ambulatory Visit: Admitting: Family Medicine

## 2024-04-17 ENCOUNTER — Encounter: Payer: Self-pay | Admitting: Family Medicine

## 2024-04-17 VITALS — BP 114/62 | HR 62 | Resp 16 | Ht 69.0 in | Wt 298.0 lb

## 2024-04-17 DIAGNOSIS — H1033 Unspecified acute conjunctivitis, bilateral: Secondary | ICD-10-CM | POA: Diagnosis not present

## 2024-04-17 DIAGNOSIS — R300 Dysuria: Secondary | ICD-10-CM | POA: Diagnosis not present

## 2024-04-17 MED ORDER — SULFAMETHOXAZOLE-TRIMETHOPRIM 800-160 MG PO TABS
1.0000 | ORAL_TABLET | Freq: Two times a day (BID) | ORAL | 0 refills | Status: AC
Start: 2024-04-17 — End: 2024-04-24

## 2024-04-17 MED ORDER — POLYMYXIN B-TRIMETHOPRIM 10000-0.1 UNIT/ML-% OP SOLN: 1.0000 [drp] | OPHTHALMIC | 0 refills | Status: AC

## 2024-04-17 NOTE — Telephone Encounter (Signed)
 I don't know if you got this message yesterday but I wanted to make sure you knew she tried to connect and couldn't   Copied from CRM #[id number]. Topic: General - Other >> Apr 16, 2024  2:26 PM Fonda T wrote: Reason for CRM: Patient calling, states she had a virtual appointment for today at 2:00 and is having difficulties connecting for appointment.   Per patient states she can do a telephone call if necessary for appointment. Attempted to contact office multiple times, no answer.  Sending CRM to office to inform. >> Apr 16, 2024  3:41 PM Montie POUR wrote: Dagoberto is calling back to check on her UTI medication.  She wants to speak with someone from the clinic. She is not able to come in for an appointment. Please call her at [phone].
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
## 2024-04-17 NOTE — Telephone Encounter (Signed)
 MyChart virtual visit has been rescheduled to 10:20 this morning so that we can review symptoms and discuss treatment options

## 2024-04-24 ENCOUNTER — Other Ambulatory Visit: Payer: Self-pay | Admitting: Family Medicine

## 2024-04-24 DIAGNOSIS — I10 Essential (primary) hypertension: Secondary | ICD-10-CM

## 2024-05-15 ENCOUNTER — Other Ambulatory Visit: Payer: Self-pay | Admitting: Family Medicine

## 2024-05-15 DIAGNOSIS — E7800 Pure hypercholesterolemia, unspecified: Secondary | ICD-10-CM

## 2024-06-09 ENCOUNTER — Encounter: Admitting: Family Medicine

## 2024-06-14 ENCOUNTER — Other Ambulatory Visit: Payer: Self-pay | Admitting: Family Medicine

## 2024-06-14 DIAGNOSIS — I10 Essential (primary) hypertension: Secondary | ICD-10-CM

## 2024-06-16 NOTE — Telephone Encounter (Signed)
 Requested Prescriptions  Pending Prescriptions Disp Refills   bisoprolol -hydrochlorothiazide  (ZIAC ) 5-6.25 MG tablet [Pharmacy Med Name: BISOPROLOL -HCTZ 5-6.25 MG TAB] 90 tablet 0    Sig: TAKE 1 TABLET BY MOUTH EVERY DAY     Cardiovascular: Beta Blocker + Diuretic Combos Failed - 06/16/2024 11:14 AM      Failed - K in normal range and within 180 days    Potassium  Date Value Ref Range Status  12/03/2023 4.8 3.5 - 5.2 mmol/L Final         Failed - Na in normal range and within 180 days    Sodium  Date Value Ref Range Status  12/03/2023 141 134 - 144 mmol/L Final         Failed - Cr in normal range and within 180 days    Creatinine, Ser  Date Value Ref Range Status  12/03/2023 0.85 0.57 - 1.00 mg/dL Final         Failed - eGFR in normal range and within 180 days    GFR calc Af Amer  Date Value Ref Range Status  06/09/2020 50 (L) >59 mL/min/1.73 Final    Comment:    **In accordance with recommendations from the NKF-ASN Task force,**   [facility] is in the process of updating its eGFR calculation to the   2021 CKD-EPI creatinine equation that estimates kidney function   without a race variable.    GFR calc non Af Amer  Date Value Ref Range Status  06/09/2020 43 (L) >59 mL/min/1.73 Final   eGFR  Date Value Ref Range Status  12/03/2023 70 >59 mL/min/1.73 Final         Failed - Valid encounter within last 6 months    Recent Outpatient Visits           2 months ago Dysuria   [facility] Gross, Curtis LABOR, FNP   2 months ago No-show for appointment   [facility] Simmons-Robinson, [place], MD   6 months ago Essential (primary) hypertension   [facility] [place], Jon HERO, MD              Passed - Last BP in normal range    BP Readings from Last 1 Encounters:  04/17/24 114/62         Passed - Last Heart Rate in normal range    Pulse Readings from Last 1 Encounters:  04/17/24 62

## 2024-07-02 ENCOUNTER — Encounter: Admitting: Family Medicine

## 2024-07-22 ENCOUNTER — Ambulatory Visit (INDEPENDENT_AMBULATORY_CARE_PROVIDER_SITE_OTHER): Admitting: Emergency Medicine

## 2024-07-22 VITALS — Ht 69.0 in | Wt 295.0 lb

## 2024-07-22 DIAGNOSIS — Z0000 Encounter for general adult medical examination without abnormal findings: Secondary | ICD-10-CM | POA: Diagnosis not present

## 2024-07-22 NOTE — Progress Notes (Signed)
 Chief Complaint  Patient presents with   Medicare Wellness     Subjective:   Tara Potter is a 78 y.o. female who presents for a Medicare Annual Wellness Visit.  Visit info / Clinical Intake: Medicare Wellness Visit Type:: Subsequent Annual Wellness Visit Persons participating in visit and providing information:: patient Medicare Wellness Visit Mode:: Telephone If telephone:: video declined Since this visit was completed virtually, some vitals may be partially provided or unavailable. Missing vitals are due to the limitations of the virtual format.: Documented vitals are patient reported If Telephone or Video please confirm:: I connected with patient using audio/video enable telemedicine. I verified patient identity with two identifiers, discussed telehealth limitations, and patient agreed to proceed. Patient Location:: home Provider Location:: home office Interpreter Needed?: No Pre-visit prep was completed: yes AWV questionnaire completed by patient prior to visit?: no Living arrangements:: (!) lives alone Patient's Overall Health Status Rating: very good Typical amount of pain: some Does pain affect daily life?: (!) yes Are you currently prescribed opioids?: no  Dietary Habits and Nutritional Risks How many meals a day?: 2 Eats fruit and vegetables daily?: (!) no (eats fairly regularly, but not everyday) Most meals are obtained by: preparing own meals In the last 2 weeks, have you had any of the following?: none Diabetic:: no  Functional Status Activities of Daily Living (to include ambulation/medication): Independent Ambulation: Independent with device- listed below Home Assistive Devices/Equipment: Johna Finder (specify Type); Other (Comment); Eyeglasses (rollator, has hearing aids, also has an art gallery manager) Medication Administration: Independent Home Management (perform basic housework or laundry): Independent Manage your own finances?: yes Primary transportation  is: driving; family / friends Concerns about vision?: no *vision screening is required for WTM* Concerns about hearing?: (!) yes Uses hearing aids?: (!) yes  Fall Screening Falls in the past year?: 0 Number of falls in past year: 0 Was there an injury with Fall?: 0 Fall Risk Category Calculator: 0 Patient Fall Risk Level: Low Fall Risk  Fall Risk Patient at Risk for Falls Due to: Impaired balance/gait; Orthopedic patient; Impaired mobility Fall risk Follow up: Falls evaluation completed; Education provided; Falls prevention discussed  Home and Transportation Safety: All rugs have non-skid backing?: yes All stairs or steps have railings?: yes Grab bars in the bathtub or shower?: yes Have non-skid surface in bathtub or shower?: yes Good home lighting?: yes Regular seat belt use?: yes Hospital stays in the last year:: no  Cognitive Assessment Difficulty concentrating, remembering, or making decisions? : no Will 6CIT or Mini Cog be Completed: yes What year is it?: 0 points What month is it?: 0 points Give patient an address phrase to remember (5 components): [address] [place] About what time is it?: 0 points Count backwards from 20 to 1: 0 points Say the months of the year in reverse: 0 points Repeat the address phrase from earlier: 0 points 6 CIT Score: 0 points  Advance Directives (For Healthcare) Does Patient Have a Medical Advance Directive?: No Would patient like information on creating a medical advance directive?: Yes (MAU/Ambulatory/Procedural Areas - Information given)  Reviewed/Updated  Reviewed/Updated: Reviewed All (Medical, Surgical, Family, Medications, Allergies, Care Teams, Patient Goals)    Allergies (verified) Augmentin [amoxicillin-pot clavulanate], Amoxicillin, Entex t [pseudoephedrine-guaifenesin], and Erythromycin   Current Medications (verified) Outpatient Encounter Medications as of 07/22/2024  Medication Sig   acetaminophen (TYLENOL) 650  MG CR tablet Take 650 mg by mouth in the morning and at bedtime.   aspirin EC 81 MG  tablet Take 81 mg by mouth every other day. (Patient taking differently: Take 81 mg by mouth every other day. Takes a couple of times per week)   atorvastatin  (LIPITOR) 10 MG tablet TAKE 1 TABLET BY MOUTH EVERY DAY   bisoprolol -hydrochlorothiazide  (ZIAC ) 5-6.25 MG tablet TAKE 1 TABLET BY MOUTH EVERY DAY   clobetasol  cream (TEMOVATE ) 0.05 % Apply 1 application. topically 2 (two) times daily. (Patient taking differently: Apply 1 application  topically 2 (two) times daily. PRN)   diclofenac  (VOLTAREN ) 75 MG EC tablet TAKE 1 TABLET BY MOUTH TWICE A DAY   diclofenac  Sodium (VOLTAREN ) 1 % GEL Apply topically daily. (Patient taking differently: Apply topically daily. PRN)   estradiol  (ESTRACE  VAGINAL) 0.1 MG/GM vaginal cream Place 1 Applicatorful vaginally 3 (three) times a week. (Patient taking differently: Place 1 Applicatorful vaginally 3 (three) times a week. PRN)   famotidine  (PEPCID ) 20 MG tablet Take 1 tablet by mouth twice daily (Patient taking differently: as needed. PRN)   fluticasone  (FLONASE ) 50 MCG/ACT nasal spray Use 2 spray(s) in each nostril once daily   gabapentin  (NEURONTIN ) 100 MG capsule TAKE 1 CAPSULE BY MOUTH EVERYDAY AT BEDTIME   levothyroxine  (SYNTHROID ) 75 MCG tablet TAKE 1 TABLET BY MOUTH EVERY DAY   loratadine (CLARITIN) 10 MG tablet Take 10 mg by mouth daily as needed for allergies.   Multiple Vitamins-Minerals (MULTIVITAL PO) Take by mouth daily. When remembers   trimethoprim -polymyxin b  (POLYTRIM ) ophthalmic solution Place 1 drop into both eyes every 4 (four) hours. (Patient taking differently: Place 1 drop into both eyes every 4 (four) hours. PRN)   [facility] ACETAMINOPHEN 325 MG CAPS  (Patient not taking: Reported on 07/22/2024)   predniSONE  (STERAPRED UNI-PAK 21 TAB) 10 MG (21) TBPK tablet 6 day taper; take as directed on package instructions (Patient not taking: Reported on 07/22/2024)   No  facility-administered encounter medications on file as of 07/22/2024.    History: Past Medical History:  Diagnosis Date   Arthritis    Hyperlipidemia    Hypertension    Past Surgical History:  Procedure Laterality Date   DILATION AND CURETTAGE OF UTERUS     TUBAL LIGATION     Family History  Problem Relation Age of Onset   Hypertension Mother    Macular degeneration Mother    Atrial fibrillation Mother    Kidney disease Mother    Heart disease Father    Arthritis Sister    Breast cancer Neg Hx    Social History   Occupational History   Occupation: retired  Tobacco Use   Smoking status: Never   Smokeless tobacco: Never  Vaping Use   Vaping status: Never Used  Substance and Sexual Activity   Alcohol use: Not Currently   Drug use: No   Sexual activity: Not on file   Tobacco Counseling Counseling given: Not Answered  SDOH Screenings   Food Insecurity: No Food Insecurity (07/22/2024)  Housing: Low Risk  (07/22/2024)  Transportation Needs: No Transportation Needs (07/22/2024)  Utilities: Not At Risk (07/22/2024)  Alcohol Screen: Low Risk  (04/29/2023)  Depression (PHQ2-9): Low Risk  (07/22/2024)  Financial Resource Strain: Low Risk  (04/26/2022)  Physical Activity: Inactive (07/22/2024)  Social Connections: Socially Isolated (07/22/2024)  Stress: Stress Concern Present (07/22/2024)  Tobacco Use: Low Risk  (07/22/2024)  Health Literacy: Adequate Health Literacy (07/22/2024)   See flowsheets for full screening details  Depression Screen PHQ 2 & 9 Depression Scale- Over the past 2 weeks, how often have you been bothered by  any of the following problems? Little interest or pleasure in doing things: 1 Feeling down, depressed, or hopeless (PHQ Adolescent also includes...irritable): 0 PHQ-2 Total Score: 1 Trouble falling or staying asleep, or sleeping too much: 0 Feeling tired or having little energy: 0 Poor appetite or overeating (PHQ Adolescent also  includes...weight loss): 0 Feeling bad about yourself - or that you are a failure or have let yourself or your family down: 0 Trouble concentrating on things, such as reading the newspaper or watching television (PHQ Adolescent also includes...like school work): 0 Moving or speaking so slowly that other people could have noticed. Or the opposite - being so fidgety or restless that you have been moving around a lot more than usual: 0 Thoughts that you would be better off dead, or of hurting yourself in some way: 0 PHQ-9 Total Score: 1 If you checked off any problems, how difficult have these problems made it for you to do your work, take care of things at home, or get along with other people?: Not difficult at all  Depression Treatment Depression Interventions/Treatment : EYV7-0 Score <4 Follow-up Not Indicated     Goals Addressed               This Visit's Progress     Increase physical activity (pt-stated)               Objective:    Today's Vitals   07/22/24 1443  Weight: 295 lb (133.8 kg)  Height: 5' 9 (1.753 m)   Body mass index is 43.56 kg/m.  Hearing/Vision screen Hearing Screening - Comments:: Wears hearing aids Vision Screening - Comments:: UTD w/ Dr. Dingeldein Lilly Jewett Immunizations and Health Maintenance Health Maintenance  Topic Date Due   Zoster Vaccines- Shingrix (1 of 2) Never done   DTaP/Tdap/Td (2 - Tdap) 12/30/2014   Influenza Vaccine  03/13/2024   COVID-19 Vaccine (4 - 2025-26 season) 04/13/2024   Bone Density Scan  08/13/2026 (Originally 01/11/2010)   Medicare Annual Wellness (AWV)  07/22/2025   Pneumococcal Vaccine: 50+ Years  Completed   Hepatitis C Screening  Completed   Meningococcal B Vaccine  Aged Out   Mammogram  Discontinued        Assessment/Plan:  This is a routine wellness examination for Tara Potter.  Patient Care Team: Myrla Jon HERO, MD as PCP - General (Family Medicine) Dingeldein, Elspeth, MD as Consulting Physician  (Ophthalmology)  I have personally reviewed and noted the following in the patients chart:   Medical and social history Use of alcohol, tobacco or illicit drugs  Current medications and supplements including opioid prescriptions. Functional ability and status Nutritional status Physical activity Advanced directives List of other physicians Hospitalizations, surgeries, and ER visits in previous 12 months Vitals Screenings to include cognitive, depression, and falls Referrals and appointments  No orders of the defined types were placed in this encounter.  In addition, I have reviewed and discussed with patient certain preventive protocols, quality metrics, and best practice recommendations. A written personalized care plan for preventive services as well as general preventive health recommendations were provided to patient.   Tara Potter, CMA   07/22/2024   Return in 53 weeks (on 07/28/2025) for Medicare Annual Wellness Visit.  After Visit Summary: (MyChart) Due to this being a telephonic visit, the after visit summary with patients personalized plan was offered to patient via MyChart   Nurse Notes:  Patient advised to keep follow-up appointment with PCP (08/20/24) Vaccines not given: Shingles vaccine declined  today Will obtain flu vaccine at next visit Will obtain Tdap and Covid vaccines at pharmacy Declined DEXA scan Screening colonoscopy and MMG no longer recommended due to age.

## 2024-07-22 NOTE — Patient Instructions (Addendum)
 [name],  Thank you for taking the time for your Medicare Wellness Visit. I appreciate your continued commitment to your health goals. Please review the care plan we discussed, and feel free to reach out if I can assist you further.  Please note that Annual Wellness Visits do not include a physical exam. Some assessments may be limited, especially if the visit was conducted virtually. If needed, we may recommend an in-person follow-up with your provider.  Ongoing Care Seeing your primary care provider every 3 to 6 months helps us  monitor your health and provide consistent, personalized care.   Referrals If a referral was made during today's visit and you haven't received any updates within two weeks, please contact the referred provider directly to check on the status.  Recommended Screenings:  You may get the  flu vaccine at your next OV on 08/20/24 or at your local pharmacy.  You may get the Covid and tetanus vaccines at your local pharmacy at your convenience.   Health Maintenance  Topic Date Due   Zoster (Shingles) Vaccine (1 of 2) Never done   DTaP/Tdap/Td vaccine (2 - Tdap) 12/30/2014   Flu Shot  03/13/2024   COVID-19 Vaccine (4 - 2025-26 season) 04/13/2024   Medicare Annual Wellness Visit  04/28/2024   Osteoporosis screening with Bone Density Scan  08/13/2026*   Pneumococcal Vaccine for age over 25  Completed   Hepatitis C Screening  Completed   Meningitis B Vaccine  Aged Out   Breast Cancer Screening  Discontinued  *Topic was postponed. The date shown is not the original due date.       07/22/2024    2:48 PM  Advanced Directives  Does Patient Have a Medical Advance Directive? No  Would patient like information on creating a medical advance directive? Yes (MAU/Ambulatory/Procedural Areas - Information given)  Information on Advanced Care Planning can be found at [place]  Secretary of [facility] Advance Health Care Directives Advance Health Care Directives ([email or web address]   You may also get the forms at your doctor's office.  Vision: Annual vision screenings are recommended for early detection of glaucoma, cataracts, and diabetic retinopathy. These exams can also reveal signs of chronic conditions such as diabetes and high blood pressure.  Dental: Annual dental screenings help detect early signs of oral cancer, gum disease, and other conditions linked to overall health, including heart disease and diabetes.  Please see the attached documents for additional preventive care recommendations.

## 2024-08-20 ENCOUNTER — Encounter: Payer: Self-pay | Admitting: Family Medicine

## 2024-08-20 ENCOUNTER — Ambulatory Visit: Payer: Self-pay

## 2024-08-20 ENCOUNTER — Ambulatory Visit (INDEPENDENT_AMBULATORY_CARE_PROVIDER_SITE_OTHER): Admitting: Family Medicine

## 2024-08-20 VITALS — BP 128/65 | HR 64 | Ht 69.0 in | Wt 308.7 lb

## 2024-08-20 DIAGNOSIS — I10 Essential (primary) hypertension: Secondary | ICD-10-CM

## 2024-08-20 DIAGNOSIS — R7303 Prediabetes: Secondary | ICD-10-CM | POA: Diagnosis not present

## 2024-08-20 DIAGNOSIS — E559 Vitamin D deficiency, unspecified: Secondary | ICD-10-CM | POA: Diagnosis not present

## 2024-08-20 DIAGNOSIS — Z0000 Encounter for general adult medical examination without abnormal findings: Secondary | ICD-10-CM

## 2024-08-20 DIAGNOSIS — B372 Candidiasis of skin and nail: Secondary | ICD-10-CM | POA: Diagnosis not present

## 2024-08-20 DIAGNOSIS — N1831 Chronic kidney disease, stage 3a: Secondary | ICD-10-CM | POA: Diagnosis not present

## 2024-08-20 DIAGNOSIS — E7800 Pure hypercholesterolemia, unspecified: Secondary | ICD-10-CM | POA: Diagnosis not present

## 2024-08-20 DIAGNOSIS — R29818 Other symptoms and signs involving the nervous system: Secondary | ICD-10-CM

## 2024-08-20 DIAGNOSIS — Z23 Encounter for immunization: Secondary | ICD-10-CM

## 2024-08-20 DIAGNOSIS — E039 Hypothyroidism, unspecified: Secondary | ICD-10-CM | POA: Diagnosis not present

## 2024-08-20 MED ORDER — CLOTRIMAZOLE 1 % EX CREA: 1.0000 | TOPICAL_CREAM | Freq: Two times a day (BID) | CUTANEOUS | 5 refills | Status: AC
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
## 2024-08-20 NOTE — Assessment & Plan Note (Signed)
 Continues on Synthroid  for thyroid  management. Timing of medication intake is not perfect, but effectiveness will be assessed through lab results. - Continue Synthroid  - Ordered thyroid  function tests

## 2024-08-20 NOTE — Assessment & Plan Note (Signed)
 Discussed importance of healthy weight management Discussed diet and exercise

## 2024-08-20 NOTE — Assessment & Plan Note (Signed)
 Last A1c back to normal Will continue to monitor

## 2024-08-20 NOTE — Assessment & Plan Note (Signed)
 Last GFR improved Will continue to monitor

## 2024-08-20 NOTE — Assessment & Plan Note (Signed)
 Continues on atorvastatin  for cholesterol management. - Continue atorvastatin 

## 2024-08-20 NOTE — Assessment & Plan Note (Signed)
 Blood pressure is well-controlled with current medication regimen. - Continue bisoprolol  and HCTZ

## 2024-08-20 NOTE — Patient Instructions (Signed)
 Consider asking at the pharmacy about the tetanus shot (TDAP) and the shingles shot (Shingrix)  ?

## 2024-08-20 NOTE — Assessment & Plan Note (Signed)
 Reports excessive daytime sleepiness and snoring. Previous observation suggested possible sleep apnea. Declines CPAP therapy due to intolerance.

## 2024-08-21 LAB — COMPREHENSIVE METABOLIC PANEL WITH GFR
ALT: 19 IU/L (ref 0–32)
AST: 19 IU/L (ref 0–40)
Albumin: 4 g/dL (ref 3.8–4.8)
Alkaline Phosphatase: 143 IU/L — ABNORMAL HIGH (ref 49–135)
BUN/Creatinine Ratio: 22 (ref 12–28)
BUN: 27 mg/dL (ref 8–27)
Bilirubin Total: 0.3 mg/dL (ref 0.0–1.2)
CO2: 25 mmol/L (ref 20–29)
Calcium: 10.2 mg/dL (ref 8.7–10.3)
Chloride: 102 mmol/L (ref 96–106)
Creatinine, Ser: 1.24 mg/dL — ABNORMAL HIGH (ref 0.57–1.00)
Globulin, Total: 2.7 g/dL (ref 1.5–4.5)
Glucose: 102 mg/dL — ABNORMAL HIGH (ref 70–99)
Potassium: 4.5 mmol/L (ref 3.5–5.2)
Sodium: 143 mmol/L (ref 134–144)
Total Protein: 6.7 g/dL (ref 6.0–8.5)
eGFR: 44 mL/min/1.73 — ABNORMAL LOW

## 2024-08-21 LAB — LIPID PANEL
Chol/HDL Ratio: 4 ratio (ref 0.0–4.4)
Cholesterol, Total: 174 mg/dL (ref 100–199)
HDL: 43 mg/dL
LDL Chol Calc (NIH): 97 mg/dL (ref 0–99)
Triglycerides: 198 mg/dL — ABNORMAL HIGH (ref 0–149)
VLDL Cholesterol Cal: 34 mg/dL (ref 5–40)

## 2024-08-21 LAB — HEMOGLOBIN A1C
Est. average glucose Bld gHb Est-mCnc: 114 mg/dL
Hgb A1c MFr Bld: 5.6 % (ref 4.8–5.6)

## 2024-08-21 LAB — VITAMIN D 25 HYDROXY (VIT D DEFICIENCY, FRACTURES): Vit D, 25-Hydroxy: 11 ng/mL — ABNORMAL LOW (ref 30.0–100.0)

## 2024-08-21 LAB — TSH: TSH: 3.81 u[IU]/mL (ref 0.450–4.500)

## 2024-08-25 ENCOUNTER — Ambulatory Visit: Payer: Self-pay | Admitting: Family Medicine

## 2024-08-25 DIAGNOSIS — E559 Vitamin D deficiency, unspecified: Secondary | ICD-10-CM

## 2024-08-25 DIAGNOSIS — R7989 Other specified abnormal findings of blood chemistry: Secondary | ICD-10-CM

## 2024-08-28 MED ORDER — VITAMIN D (ERGOCALCIFEROL) 1.25 MG (50000 UNIT) PO CAPS: 50000.0000 [IU] | ORAL_CAPSULE | ORAL | 1 refills | Status: AC

## 2024-09-10 ENCOUNTER — Other Ambulatory Visit: Payer: Self-pay | Admitting: Family Medicine

## 2024-09-10 DIAGNOSIS — M5432 Sciatica, left side: Secondary | ICD-10-CM

## 2024-09-15 ENCOUNTER — Other Ambulatory Visit: Payer: Self-pay | Admitting: Family Medicine

## 2024-09-15 DIAGNOSIS — I10 Essential (primary) hypertension: Secondary | ICD-10-CM

## 2024-09-15 NOTE — Telephone Encounter (Unsigned)
 Copied from CRM #[id number]. Topic: Clinical - Medication Question >> Sep 15, 2024  9:43 AM Avram MATSU wrote: Reason for CRM: patient called about her rx, and was informed of the turnaround time. Due to the bad weather patient was not able to call it in earlier. Would like to know if the doctor can call it in today, pt has two pills left Please advise. bisoprolol -hydrochlorothiazide  (ZIAC ) 5-6.25 MG tablet >> Sep 15, 2024 10:24 AM Sophia H wrote: Patient is requesting pharmacy update for this medication once sent in. TY   [facility] - [place], [place] - [address]  [address] [place] [address]  Phone: [phone] Fax: [phone]

## 2024-09-16 ENCOUNTER — Telehealth: Payer: Self-pay | Admitting: Family Medicine

## 2024-09-16 MED ORDER — BISOPROLOL-HYDROCHLOROTHIAZIDE 5-6.25 MG PO TABS: 1.0000 | ORAL_TABLET | Freq: Every day | ORAL | 3 refills | Status: AC

## 2024-09-16 NOTE — Telephone Encounter (Signed)
 Duplicate request, refilled 09/15/24.  Requested Prescriptions  Pending Prescriptions Disp Refills   bisoprolol -hydrochlorothiazide  (ZIAC ) 5-6.25 MG tablet 90 tablet 0    Sig: Take 1 tablet by mouth daily.     Cardiovascular: Beta Blocker + Diuretic Combos Failed - 09/16/2024  3:32 PM      Failed - Cr in normal range and within 180 days    Creatinine, Ser  Date Value Ref Range Status  08/20/2024 1.24 (H) 0.57 - 1.00 mg/dL Final         Failed - eGFR in normal range and within 180 days    GFR calc Af Amer  Date Value Ref Range Status  06/09/2020 50 (L) >59 mL/min/1.73 Final    Comment:    **In accordance with recommendations from the NKF-ASN Task force,**   [facility] is in the process of updating its eGFR calculation to the   2021 CKD-EPI creatinine equation that estimates kidney function   without a race variable.    GFR calc non Af Amer  Date Value Ref Range Status  06/09/2020 43 (L) >59 mL/min/1.73 Final   eGFR  Date Value Ref Range Status  08/20/2024 44 (L) >59 mL/min/1.73 Final         Passed - K in normal range and within 180 days    Potassium  Date Value Ref Range Status  08/20/2024 4.5 3.5 - 5.2 mmol/L Final         Passed - Na in normal range and within 180 days    Sodium  Date Value Ref Range Status  08/20/2024 143 134 - 144 mmol/L Final         Passed - Last BP in normal range    BP Readings from Last 1 Encounters:  08/20/24 128/65         Passed - Last Heart Rate in normal range    Pulse Readings from Last 1 Encounters:  08/20/24 64         Passed - Valid encounter within last 6 months    Recent Outpatient Visits           3 weeks ago Encounter for annual physical exam   [place] [facility] [place], Jon HERO, MD   5 months ago Dysuria   [facility] [place], Curtis LABOR, FNP   5 months ago No-show for appointment   [facility] Simmons-Robinson, [place], MD   9 months ago  Essential (primary) hypertension   [place] [facility] [place], Jon HERO, MD

## 2024-09-16 NOTE — Telephone Encounter (Signed)
 Change of Pharmacy  Requested Prescriptions  Pending Prescriptions Disp Refills   bisoprolol -hydrochlorothiazide  (ZIAC ) 5-6.25 MG tablet 90 tablet 3    Sig: Take 1 tablet by mouth daily.     Cardiovascular: Beta Blocker + Diuretic Combos Failed - 09/16/2024  3:43 PM      Failed - Cr in normal range and within 180 days    Creatinine, Ser  Date Value Ref Range Status  08/20/2024 1.24 (H) 0.57 - 1.00 mg/dL Final         Failed - eGFR in normal range and within 180 days    GFR calc Af Amer  Date Value Ref Range Status  06/09/2020 50 (L) >59 mL/min/1.73 Final    Comment:    **In accordance with recommendations from the NKF-ASN Task force,**   [facility] is in the process of updating its eGFR calculation to the   2021 CKD-EPI creatinine equation that estimates kidney function   without a race variable.    GFR calc non Af Amer  Date Value Ref Range Status  06/09/2020 43 (L) >59 mL/min/1.73 Final   eGFR  Date Value Ref Range Status  08/20/2024 44 (L) >59 mL/min/1.73 Final         Passed - K in normal range and within 180 days    Potassium  Date Value Ref Range Status  08/20/2024 4.5 3.5 - 5.2 mmol/L Final         Passed - Na in normal range and within 180 days    Sodium  Date Value Ref Range Status  08/20/2024 143 134 - 144 mmol/L Final         Passed - Last BP in normal range    BP Readings from Last 1 Encounters:  08/20/24 128/65         Passed - Last Heart Rate in normal range    Pulse Readings from Last 1 Encounters:  08/20/24 64         Passed - Valid encounter within last 6 months    Recent Outpatient Visits           3 weeks ago Encounter for annual physical exam   [place] [facility] [place], Jon HERO, MD   5 months ago Dysuria   [facility] [place], Curtis LABOR, FNP   5 months ago No-show for appointment   [facility] Simmons-Robinson, Rockie, MD   9 months ago Essential (primary)  hypertension   [facility] [place], Jon HERO, MD

## 2024-09-16 NOTE — Telephone Encounter (Signed)
 Spoke with pt and made aware per results Dr. B recommended Vit D. Pt stated when they called her to advised results they advised her to take vit B and when med was picked up she read bottle said Vit D, she wanted clarification before taking medicine. She was clarified on correct Vitamin (D) medication and pt verbalized understanding will start it today.

## 2024-09-16 NOTE — Telephone Encounter (Unsigned)
 Copied from CRM [id number]. Topic: Clinical - Medication Question >> Sep 16, 2024  2:01 PM Tiffini S wrote: Reason for CRM: Patient was prescribed a medication for   Vitamin D , Ergocalciferol , (DRISDOL ) 1.25 MG (50000 UNIT) CAPS capsule- was told to take Vitamin B- need a nurse to call and confirm the correct prescription/ haven't taken the Vitamin D  yet CB: [phone]

## 2025-02-18 ENCOUNTER — Ambulatory Visit: Admitting: Family Medicine

## 2025-07-28 ENCOUNTER — Ambulatory Visit
